# Patient Record
Sex: Male | Born: 1945 | Race: White | Hispanic: No | Marital: Married | Smoking: Former smoker
Health system: Southern US, Community
[De-identification: ages and names within clinical notes are randomized; demographics above are authoritative.]

## PROBLEM LIST (undated history)

## (undated) DIAGNOSIS — F419 Anxiety disorder, unspecified: Secondary | ICD-10-CM

## (undated) DIAGNOSIS — T4145XA Adverse effect of unspecified anesthetic, initial encounter: Secondary | ICD-10-CM

## (undated) DIAGNOSIS — Z8719 Personal history of other diseases of the digestive system: Secondary | ICD-10-CM

## (undated) DIAGNOSIS — I10 Essential (primary) hypertension: Secondary | ICD-10-CM

## (undated) DIAGNOSIS — T8859XA Other complications of anesthesia, initial encounter: Secondary | ICD-10-CM

## (undated) DIAGNOSIS — M1990 Unspecified osteoarthritis, unspecified site: Secondary | ICD-10-CM

## (undated) DIAGNOSIS — G709 Myoneural disorder, unspecified: Secondary | ICD-10-CM

## (undated) DIAGNOSIS — F329 Major depressive disorder, single episode, unspecified: Secondary | ICD-10-CM

## (undated) DIAGNOSIS — J439 Emphysema, unspecified: Secondary | ICD-10-CM

## (undated) DIAGNOSIS — F4310 Post-traumatic stress disorder, unspecified: Secondary | ICD-10-CM

## (undated) DIAGNOSIS — Z8709 Personal history of other diseases of the respiratory system: Secondary | ICD-10-CM

## (undated) DIAGNOSIS — C801 Malignant (primary) neoplasm, unspecified: Secondary | ICD-10-CM

## (undated) DIAGNOSIS — R51 Headache: Secondary | ICD-10-CM

## (undated) DIAGNOSIS — F32A Depression, unspecified: Secondary | ICD-10-CM

## (undated) HISTORY — PX: JOINT REPLACEMENT: SHX530

## (undated) HISTORY — DX: Anxiety disorder, unspecified: F41.9

## (undated) HISTORY — PX: COLONOSCOPY W/ POLYPECTOMY: SHX1380

## (undated) HISTORY — DX: Major depressive disorder, single episode, unspecified: F32.9

## (undated) HISTORY — PX: BRAIN SURGERY: SHX531

## (undated) HISTORY — DX: Personal history of other diseases of the respiratory system: Z87.09

## (undated) HISTORY — PX: TOTAL HIP ARTHROPLASTY: SHX124

## (undated) HISTORY — DX: Emphysema, unspecified: J43.9

## (undated) HISTORY — DX: Depression, unspecified: F32.A

## (undated) HISTORY — PX: HERNIA REPAIR: SHX51

## (undated) HISTORY — PX: HIP SURGERY: SHX245

## (undated) HISTORY — PX: NECK SURGERY: SHX720

## (undated) HISTORY — PX: BACK SURGERY: SHX140

---

## 1999-05-15 ENCOUNTER — Emergency Department (HOSPITAL_COMMUNITY): Admission: EM | Admit: 1999-05-15 | Discharge: 1999-05-15 | Payer: Self-pay | Admitting: Emergency Medicine

## 1999-05-15 ENCOUNTER — Encounter: Payer: Self-pay | Admitting: Emergency Medicine

## 2000-04-03 ENCOUNTER — Encounter: Admission: RE | Admit: 2000-04-03 | Discharge: 2000-04-03 | Payer: Self-pay | Admitting: Internal Medicine

## 2000-08-14 ENCOUNTER — Emergency Department (HOSPITAL_COMMUNITY): Admission: EM | Admit: 2000-08-14 | Discharge: 2000-08-14 | Payer: Self-pay | Admitting: Emergency Medicine

## 2000-08-15 ENCOUNTER — Encounter: Payer: Self-pay | Admitting: Emergency Medicine

## 2000-08-22 ENCOUNTER — Encounter: Admission: RE | Admit: 2000-08-22 | Discharge: 2000-08-22 | Payer: Self-pay | Admitting: Internal Medicine

## 2000-10-19 ENCOUNTER — Ambulatory Visit (HOSPITAL_COMMUNITY): Admission: RE | Admit: 2000-10-19 | Discharge: 2000-10-19 | Payer: Self-pay | Admitting: Neurosurgery

## 2000-10-19 ENCOUNTER — Encounter: Payer: Self-pay | Admitting: Neurosurgery

## 2002-06-16 ENCOUNTER — Emergency Department (HOSPITAL_COMMUNITY): Admission: EM | Admit: 2002-06-16 | Discharge: 2002-06-16 | Payer: Self-pay | Admitting: Internal Medicine

## 2006-06-26 ENCOUNTER — Inpatient Hospital Stay (HOSPITAL_COMMUNITY): Admission: EM | Admit: 2006-06-26 | Discharge: 2006-07-11 | Payer: Self-pay | Admitting: Emergency Medicine

## 2006-06-28 ENCOUNTER — Encounter (INDEPENDENT_AMBULATORY_CARE_PROVIDER_SITE_OTHER): Payer: Self-pay | Admitting: Specialist

## 2006-06-29 ENCOUNTER — Encounter (INDEPENDENT_AMBULATORY_CARE_PROVIDER_SITE_OTHER): Payer: Self-pay | Admitting: Interventional Radiology

## 2006-06-30 ENCOUNTER — Encounter (INDEPENDENT_AMBULATORY_CARE_PROVIDER_SITE_OTHER): Payer: Self-pay | Admitting: *Deleted

## 2006-07-04 ENCOUNTER — Encounter (INDEPENDENT_AMBULATORY_CARE_PROVIDER_SITE_OTHER): Payer: Self-pay | Admitting: Neurosurgery

## 2006-07-04 ENCOUNTER — Ambulatory Visit: Payer: Self-pay | Admitting: Oncology

## 2006-07-04 ENCOUNTER — Encounter (INDEPENDENT_AMBULATORY_CARE_PROVIDER_SITE_OTHER): Payer: Self-pay | Admitting: Specialist

## 2006-07-05 ENCOUNTER — Ambulatory Visit: Payer: Self-pay | Admitting: Oncology

## 2006-07-07 ENCOUNTER — Ambulatory Visit: Payer: Self-pay | Admitting: Cardiology

## 2006-07-07 ENCOUNTER — Encounter: Payer: Self-pay | Admitting: Cardiology

## 2006-07-20 ENCOUNTER — Ambulatory Visit (HOSPITAL_COMMUNITY): Admission: RE | Admit: 2006-07-20 | Discharge: 2006-07-20 | Payer: Self-pay | Admitting: Oncology

## 2006-07-26 ENCOUNTER — Ambulatory Visit: Admission: RE | Admit: 2006-07-26 | Discharge: 2006-09-01 | Payer: Self-pay | Admitting: *Deleted

## 2006-08-16 ENCOUNTER — Ambulatory Visit: Payer: Self-pay | Admitting: Oncology

## 2006-08-16 LAB — CBC WITH DIFFERENTIAL/PLATELET
BASO%: 0.4 % (ref 0.0–2.0)
LYMPH%: 18.5 % (ref 14.0–48.0)
MCHC: 34.4 g/dL (ref 32.0–35.9)
MONO#: 0.5 10*3/uL (ref 0.1–0.9)
Platelets: 186 10*3/uL (ref 145–400)
RBC: 4.82 10*6/uL (ref 4.20–5.71)
RDW: 15.2 % — ABNORMAL HIGH (ref 11.2–14.6)
WBC: 3.3 10*3/uL — ABNORMAL LOW (ref 4.0–10.0)
lymph#: 0.6 10*3/uL — ABNORMAL LOW (ref 0.9–3.3)

## 2006-08-25 LAB — CBC WITH DIFFERENTIAL/PLATELET
BASO%: 0.4 % (ref 0.0–2.0)
HCT: 43.3 % (ref 38.7–49.9)
MCHC: 33.3 g/dL (ref 32.0–35.9)
MONO#: 0.7 10*3/uL (ref 0.1–0.9)
NEUT%: 81.4 % — ABNORMAL HIGH (ref 40.0–75.0)
WBC: 8.5 10*3/uL (ref 4.0–10.0)
lymph#: 0.7 10*3/uL — ABNORMAL LOW (ref 0.9–3.3)

## 2006-09-06 LAB — URINALYSIS, MICROSCOPIC - CHCC
Ketones: NEGATIVE mg/dL
Protein: <30 mg/dL
Specific Gravity, Urine: 1.025 (ref 1.003–1.035)
pH: 6 (ref 4.6–8.0)

## 2006-09-06 LAB — CBC WITH DIFFERENTIAL/PLATELET
Basophils Absolute: 0.1 10*3/uL (ref 0.0–0.1)
HCT: 41.9 % (ref 38.7–49.9)
HGB: 14.4 g/dL (ref 13.0–17.1)
MCH: 31.3 pg (ref 28.0–33.4)
MONO#: 0.8 10*3/uL (ref 0.1–0.9)
NEUT%: 70.2 % (ref 40.0–75.0)
WBC: 6.3 10*3/uL (ref 4.0–10.0)
lymph#: 0.9 10*3/uL (ref 0.9–3.3)

## 2006-09-15 LAB — CBC WITH DIFFERENTIAL/PLATELET
BASO%: 0.5 % (ref 0.0–2.0)
Basophils Absolute: 0 10*3/uL (ref 0.0–0.1)
EOS%: 2.2 % (ref 0.0–7.0)
Eosinophils Absolute: 0.1 10*3/uL (ref 0.0–0.5)
HCT: 38.4 % — ABNORMAL LOW (ref 38.7–49.9)
HGB: 13.9 g/dL (ref 13.0–17.1)
LYMPH%: 24.7 % (ref 14.0–48.0)
MCH: 31.7 pg (ref 28.0–33.4)
MCHC: 36.3 g/dL — ABNORMAL HIGH (ref 32.0–35.9)
MCV: 87.4 fL (ref 81.6–98.0)
MONO#: 0.3 10*3/uL (ref 0.1–0.9)
MONO%: 10.6 % (ref 0.0–13.0)
NEUT#: 1.6 10*3/uL (ref 1.5–6.5)
NEUT%: 62 % (ref 40.0–75.0)
Platelets: 163 10*3/uL (ref 145–400)
RBC: 4.39 10*6/uL (ref 4.20–5.71)
RDW: 17.7 % — ABNORMAL HIGH (ref 11.2–14.6)
WBC: 2.6 10*3/uL — ABNORMAL LOW (ref 4.0–10.0)
lymph#: 0.6 10*3/uL — ABNORMAL LOW (ref 0.9–3.3)

## 2006-09-26 LAB — CBC WITH DIFFERENTIAL/PLATELET
Basophils Absolute: 0 10*3/uL (ref 0.0–0.1)
Eosinophils Absolute: 0 10*3/uL (ref 0.0–0.5)
HCT: 35.5 % — ABNORMAL LOW (ref 38.7–49.9)
HGB: 12.8 g/dL — ABNORMAL LOW (ref 13.0–17.1)
LYMPH%: 9.4 % — ABNORMAL LOW (ref 14.0–48.0)
MCHC: 36 g/dL — ABNORMAL HIGH (ref 32.0–35.9)
MONO#: 0.8 10*3/uL (ref 0.1–0.9)
NEUT#: 4.4 10*3/uL (ref 1.5–6.5)
NEUT%: 75.3 % — ABNORMAL HIGH (ref 40.0–75.0)
Platelets: 351 10*3/uL (ref 145–400)
WBC: 5.8 10*3/uL (ref 4.0–10.0)

## 2006-09-26 LAB — COMPREHENSIVE METABOLIC PANEL
BUN: 18 mg/dL (ref 6–23)
CO2: 23 mEq/L (ref 19–32)
Calcium: 8.9 mg/dL (ref 8.4–10.5)
Creatinine, Ser: 0.95 mg/dL (ref 0.40–1.50)
Glucose, Bld: 133 mg/dL — ABNORMAL HIGH (ref 70–99)
Total Bilirubin: 0.3 mg/dL (ref 0.3–1.2)

## 2006-09-28 ENCOUNTER — Ambulatory Visit: Payer: Self-pay | Admitting: Oncology

## 2006-10-10 LAB — COMPREHENSIVE METABOLIC PANEL
Albumin: 4.1 g/dL (ref 3.5–5.2)
BUN: 23 mg/dL (ref 6–23)
CO2: 25 mEq/L (ref 19–32)
Calcium: 9.2 mg/dL (ref 8.4–10.5)
Chloride: 107 mEq/L (ref 96–112)
Glucose, Bld: 74 mg/dL (ref 70–99)
Potassium: 4.6 mEq/L (ref 3.5–5.3)
Total Protein: 6.5 g/dL (ref 6.0–8.3)

## 2006-10-10 LAB — PSA: PSA: 1.54 ng/mL (ref 0.10–4.00)

## 2006-10-10 LAB — CBC WITH DIFFERENTIAL/PLATELET
Eosinophils Absolute: 0.1 10*3/uL (ref 0.0–0.5)
HCT: 38.8 % (ref 38.7–49.9)
LYMPH%: 14.9 % (ref 14.0–48.0)
MONO#: 0.7 10*3/uL (ref 0.1–0.9)
NEUT#: 3.9 10*3/uL (ref 1.5–6.5)
NEUT%: 68.6 % (ref 40.0–75.0)
Platelets: 175 10*3/uL (ref 145–400)
WBC: 5.7 10*3/uL (ref 4.0–10.0)
lymph#: 0.8 10*3/uL — ABNORMAL LOW (ref 0.9–3.3)

## 2006-10-17 LAB — COMPREHENSIVE METABOLIC PANEL
ALT: 36 U/L (ref 0–53)
Albumin: 4 g/dL (ref 3.5–5.2)
Alkaline Phosphatase: 82 U/L (ref 39–117)
CO2: 22 mEq/L (ref 19–32)
Glucose, Bld: 90 mg/dL (ref 70–99)
Potassium: 4.3 mEq/L (ref 3.5–5.3)
Sodium: 140 mEq/L (ref 135–145)
Total Bilirubin: 0.4 mg/dL (ref 0.3–1.2)
Total Protein: 6.7 g/dL (ref 6.0–8.3)

## 2006-10-17 LAB — CBC WITH DIFFERENTIAL/PLATELET
BASO%: 0.1 % (ref 0.0–2.0)
Eosinophils Absolute: 0 10*3/uL (ref 0.0–0.5)
MCHC: 35 g/dL (ref 32.0–35.9)
MONO#: 1.2 10*3/uL — ABNORMAL HIGH (ref 0.1–0.9)
NEUT#: 6.3 10*3/uL (ref 1.5–6.5)
RBC: 3.97 10*6/uL — ABNORMAL LOW (ref 4.20–5.71)
RDW: 19.9 % — ABNORMAL HIGH (ref 11.2–14.6)
WBC: 8.5 10*3/uL (ref 4.0–10.0)

## 2006-10-27 LAB — CBC WITH DIFFERENTIAL/PLATELET
BASO%: 0.2 % (ref 0.0–2.0)
EOS%: 1.4 % (ref 0.0–7.0)
Eosinophils Absolute: 0 10*3/uL (ref 0.0–0.5)
LYMPH%: 24.7 % (ref 14.0–48.0)
MCHC: 35.8 g/dL (ref 32.0–35.9)
MCV: 91.3 fL (ref 81.6–98.0)
MONO%: 12.9 % (ref 0.0–13.0)
NEUT#: 1.9 10*3/uL (ref 1.5–6.5)
Platelets: 172 10*3/uL (ref 145–400)
RBC: 4.09 10*6/uL — ABNORMAL LOW (ref 4.20–5.71)
RDW: 17.1 % — ABNORMAL HIGH (ref 11.2–14.6)

## 2006-10-31 ENCOUNTER — Ambulatory Visit (HOSPITAL_COMMUNITY): Admission: RE | Admit: 2006-10-31 | Discharge: 2006-10-31 | Payer: Self-pay | Admitting: Oncology

## 2006-11-06 LAB — COMPREHENSIVE METABOLIC PANEL
ALT: 24 U/L (ref 0–53)
AST: 25 U/L (ref 0–37)
BUN: 20 mg/dL (ref 6–23)
Calcium: 9.1 mg/dL (ref 8.4–10.5)
Chloride: 104 mEq/L (ref 96–112)
Creatinine, Ser: 1.14 mg/dL (ref 0.40–1.50)
Total Bilirubin: 0.4 mg/dL (ref 0.3–1.2)

## 2006-11-06 LAB — CBC WITH DIFFERENTIAL/PLATELET
BASO%: 0.4 % (ref 0.0–2.0)
Eosinophils Absolute: 0 10*3/uL (ref 0.0–0.5)
LYMPH%: 13.3 % — ABNORMAL LOW (ref 14.0–48.0)
MCHC: 35.8 g/dL (ref 32.0–35.9)
MONO#: 0.8 10*3/uL (ref 0.1–0.9)
NEUT#: 4 10*3/uL (ref 1.5–6.5)
RBC: 3.96 10*6/uL — ABNORMAL LOW (ref 4.20–5.71)
RDW: 18.3 % — ABNORMAL HIGH (ref 11.2–14.6)
WBC: 5.6 10*3/uL (ref 4.0–10.0)
lymph#: 0.7 10*3/uL — ABNORMAL LOW (ref 0.9–3.3)

## 2006-11-10 ENCOUNTER — Ambulatory Visit: Payer: Self-pay | Admitting: Oncology

## 2006-11-16 LAB — CBC WITH DIFFERENTIAL/PLATELET
Basophils Absolute: 0 10*3/uL (ref 0.0–0.1)
HCT: 35.1 % — ABNORMAL LOW (ref 38.7–49.9)
HGB: 12.6 g/dL — ABNORMAL LOW (ref 13.0–17.1)
LYMPH%: 33.1 % (ref 14.0–48.0)
MCH: 34 pg — ABNORMAL HIGH (ref 28.0–33.4)
MONO#: 0.2 10*3/uL (ref 0.1–0.9)
NEUT%: 49.1 % (ref 40.0–75.0)
Platelets: 128 10*3/uL — ABNORMAL LOW (ref 145–400)
WBC: 1.4 10*3/uL — ABNORMAL LOW (ref 4.0–10.0)
lymph#: 0.5 10*3/uL — ABNORMAL LOW (ref 0.9–3.3)

## 2006-11-29 LAB — CBC WITH DIFFERENTIAL/PLATELET
Basophils Absolute: 0 10*3/uL (ref 0.0–0.1)
EOS%: 0.3 % (ref 0.0–7.0)
HCT: 37.2 % — ABNORMAL LOW (ref 38.7–49.9)
HGB: 13 g/dL (ref 13.0–17.1)
LYMPH%: 14.9 % (ref 14.0–48.0)
MCH: 34 pg — ABNORMAL HIGH (ref 28.0–33.4)
MCHC: 35.1 g/dL (ref 32.0–35.9)
MCV: 96.9 fL (ref 81.6–98.0)
MONO%: 18 % — ABNORMAL HIGH (ref 0.0–13.0)
NEUT%: 66.5 % (ref 40.0–75.0)

## 2006-12-06 LAB — COMPREHENSIVE METABOLIC PANEL
AST: 17 U/L (ref 0–37)
Albumin: 4.1 g/dL (ref 3.5–5.2)
Alkaline Phosphatase: 101 U/L (ref 39–117)
BUN: 20 mg/dL (ref 6–23)
Potassium: 4.2 mEq/L (ref 3.5–5.3)

## 2006-12-06 LAB — CBC WITH DIFFERENTIAL/PLATELET
BASO%: 0.6 % (ref 0.0–2.0)
Basophils Absolute: 0 10*3/uL (ref 0.0–0.1)
EOS%: 2.7 % (ref 0.0–7.0)
MCH: 34.9 pg — ABNORMAL HIGH (ref 28.0–33.4)
MCHC: 35.9 g/dL (ref 32.0–35.9)
MCV: 97.2 fL (ref 81.6–98.0)
MONO%: 8.3 % (ref 0.0–13.0)
RBC: 3.63 10*6/uL — ABNORMAL LOW (ref 4.20–5.71)
RDW: 14.7 % — ABNORMAL HIGH (ref 11.2–14.6)

## 2006-12-13 LAB — CBC WITH DIFFERENTIAL/PLATELET
Basophils Absolute: 0.2 10*3/uL — ABNORMAL HIGH (ref 0.0–0.1)
Eosinophils Absolute: 0 10*3/uL (ref 0.0–0.5)
HCT: 36.6 % — ABNORMAL LOW (ref 38.7–49.9)
HGB: 13.2 g/dL (ref 13.0–17.1)
LYMPH%: 3.5 % — ABNORMAL LOW (ref 14.0–48.0)
MCV: 95.5 fL (ref 81.6–98.0)
MONO%: 6.3 % (ref 0.0–13.0)
NEUT#: 14.4 10*3/uL — ABNORMAL HIGH (ref 1.5–6.5)
NEUT%: 88.9 % — ABNORMAL HIGH (ref 40.0–75.0)
Platelets: 171 10*3/uL (ref 145–400)

## 2007-02-26 ENCOUNTER — Ambulatory Visit: Payer: Self-pay | Admitting: Oncology

## 2007-03-02 LAB — CBC WITH DIFFERENTIAL/PLATELET
Eosinophils Absolute: 0.1 10*3/uL (ref 0.0–0.5)
LYMPH%: 34.5 % (ref 14.0–48.0)
MCH: 33.5 pg — ABNORMAL HIGH (ref 28.0–33.4)
MCHC: 35.6 g/dL (ref 32.0–35.9)
MCV: 94 fL (ref 81.6–98.0)
MONO%: 20.2 % — ABNORMAL HIGH (ref 0.0–13.0)
NEUT#: 1.4 10*3/uL — ABNORMAL LOW (ref 1.5–6.5)
Platelets: 169 10*3/uL (ref 145–400)
RBC: 4.37 10*6/uL (ref 4.20–5.71)

## 2007-03-06 LAB — IMMUNOFIXATION ELECTROPHORESIS: Total Protein, Serum Electrophoresis: 7 g/dL (ref 6.0–8.3)

## 2007-03-06 LAB — COMPREHENSIVE METABOLIC PANEL
Alkaline Phosphatase: 78 U/L (ref 39–117)
Glucose, Bld: 84 mg/dL (ref 70–99)
Sodium: 140 mEq/L (ref 135–145)
Total Bilirubin: 0.4 mg/dL (ref 0.3–1.2)
Total Protein: 7 g/dL (ref 6.0–8.3)

## 2007-03-06 LAB — LACTATE DEHYDROGENASE: LDH: 192 U/L (ref 94–250)

## 2007-05-25 ENCOUNTER — Ambulatory Visit: Payer: Self-pay | Admitting: Oncology

## 2007-05-31 ENCOUNTER — Ambulatory Visit (HOSPITAL_COMMUNITY): Admission: RE | Admit: 2007-05-31 | Discharge: 2007-05-31 | Payer: Self-pay | Admitting: Oncology

## 2007-05-31 LAB — COMPREHENSIVE METABOLIC PANEL
AST: 32 U/L (ref 0–37)
BUN: 16 mg/dL (ref 6–23)
Calcium: 9.5 mg/dL (ref 8.4–10.5)
Chloride: 104 mEq/L (ref 96–112)
Creatinine, Ser: 0.87 mg/dL (ref 0.40–1.50)

## 2007-05-31 LAB — CBC WITH DIFFERENTIAL/PLATELET
Basophils Absolute: 0 10*3/uL (ref 0.0–0.1)
EOS%: 3.6 % (ref 0.0–7.0)
HCT: 43 % (ref 38.7–49.9)
HGB: 15.1 g/dL (ref 13.0–17.1)
LYMPH%: 24.7 % (ref 14.0–48.0)
MCH: 33.4 pg (ref 28.0–33.4)
MCV: 95 fL (ref 81.6–98.0)
NEUT%: 55.5 % (ref 40.0–75.0)
Platelets: 175 10*3/uL (ref 145–400)
lymph#: 0.9 10*3/uL (ref 0.9–3.3)

## 2007-05-31 LAB — LACTATE DEHYDROGENASE: LDH: 162 U/L (ref 94–250)

## 2007-07-04 LAB — CBC WITH DIFFERENTIAL/PLATELET
BASO%: 0.9 % (ref 0.0–2.0)
EOS%: 4.7 % (ref 0.0–7.0)
LYMPH%: 20.7 % (ref 14.0–48.0)
MCH: 34.2 pg — ABNORMAL HIGH (ref 28.0–33.4)
MCHC: 36.7 g/dL — ABNORMAL HIGH (ref 32.0–35.9)
MONO#: 0.7 10*3/uL (ref 0.1–0.9)
NEUT%: 58.5 % (ref 40.0–75.0)
RBC: 4.37 10*6/uL (ref 4.20–5.71)
WBC: 4.5 10*3/uL (ref 4.0–10.0)
lymph#: 0.9 10*3/uL (ref 0.9–3.3)

## 2007-11-09 ENCOUNTER — Ambulatory Visit: Payer: Self-pay | Admitting: Oncology

## 2007-12-20 LAB — CBC WITH DIFFERENTIAL/PLATELET
BASO%: 0.5 % (ref 0.0–2.0)
Eosinophils Absolute: 0.1 10*3/uL (ref 0.0–0.5)
LYMPH%: 10.8 % — ABNORMAL LOW (ref 14.0–48.0)
MONO#: 0.9 10*3/uL (ref 0.1–0.9)
NEUT#: 9.2 10*3/uL — ABNORMAL HIGH (ref 1.5–6.5)
Platelets: 178 10*3/uL (ref 145–400)
RBC: 4.18 10*6/uL — ABNORMAL LOW (ref 4.20–5.71)
WBC: 11.4 10*3/uL — ABNORMAL HIGH (ref 4.0–10.0)
lymph#: 1.2 10*3/uL (ref 0.9–3.3)

## 2007-12-20 LAB — CHCC SMEAR

## 2007-12-20 LAB — MORPHOLOGY: PLT EST: ADEQUATE

## 2008-02-27 ENCOUNTER — Ambulatory Visit: Payer: Self-pay | Admitting: Oncology

## 2008-03-03 LAB — CBC WITH DIFFERENTIAL/PLATELET
Basophils Absolute: 0 10*3/uL (ref 0.0–0.1)
Eosinophils Absolute: 0.3 10*3/uL (ref 0.0–0.5)
HCT: 42.4 % (ref 38.7–49.9)
HGB: 14.9 g/dL (ref 13.0–17.1)
LYMPH%: 20.5 % (ref 14.0–48.0)
MCV: 95.6 fL (ref 81.6–98.0)
MONO#: 0.5 10*3/uL (ref 0.1–0.9)
MONO%: 10.1 % (ref 0.0–13.0)
NEUT#: 3.1 10*3/uL (ref 1.5–6.5)
Platelets: 163 10*3/uL (ref 145–400)
WBC: 4.9 10*3/uL (ref 4.0–10.0)

## 2008-03-03 LAB — CHCC SMEAR

## 2008-03-04 LAB — COMPREHENSIVE METABOLIC PANEL
ALT: 25 U/L (ref 0–53)
Alkaline Phosphatase: 77 U/L (ref 39–117)
Creatinine, Ser: 1.28 mg/dL (ref 0.40–1.50)
Glucose, Bld: 81 mg/dL (ref 70–99)
Sodium: 140 mEq/L (ref 135–145)
Total Bilirubin: 0.4 mg/dL (ref 0.3–1.2)
Total Protein: 7.1 g/dL (ref 6.0–8.3)

## 2008-03-04 LAB — IGG, IGA, IGM: IgA: 244 mg/dL (ref 68–378)

## 2008-03-04 LAB — BETA 2 MICROGLOBULIN, SERUM: Beta-2 Microglobulin: 1.71 mg/L (ref 1.01–1.73)

## 2008-05-23 ENCOUNTER — Ambulatory Visit: Payer: Self-pay | Admitting: Oncology

## 2008-05-27 LAB — CBC WITH DIFFERENTIAL/PLATELET
BASO%: 0.4 % (ref 0.0–2.0)
EOS%: 3.4 % (ref 0.0–7.0)
HCT: 45.4 % (ref 38.7–49.9)
LYMPH%: 26.7 % (ref 14.0–48.0)
MCH: 34.6 pg — ABNORMAL HIGH (ref 28.0–33.4)
MCHC: 35 g/dL (ref 32.0–35.9)
MCV: 98.8 fL — ABNORMAL HIGH (ref 81.6–98.0)
MONO%: 13.3 % — ABNORMAL HIGH (ref 0.0–13.0)
NEUT%: 56.2 % (ref 40.0–75.0)
lymph#: 1.3 10*3/uL (ref 0.9–3.3)

## 2008-05-28 LAB — COMPREHENSIVE METABOLIC PANEL
ALT: 21 U/L (ref 0–53)
AST: 23 U/L (ref 0–37)
Alkaline Phosphatase: 70 U/L (ref 39–117)
BUN: 27 mg/dL — ABNORMAL HIGH (ref 6–23)
Chloride: 105 mEq/L (ref 96–112)
Creatinine, Ser: 1.09 mg/dL (ref 0.40–1.50)
Total Bilirubin: 0.7 mg/dL (ref 0.3–1.2)

## 2008-08-19 ENCOUNTER — Ambulatory Visit: Payer: Self-pay | Admitting: Oncology

## 2008-08-25 ENCOUNTER — Inpatient Hospital Stay (HOSPITAL_COMMUNITY): Admission: RE | Admit: 2008-08-25 | Discharge: 2008-08-27 | Payer: Self-pay | Admitting: Orthopedic Surgery

## 2008-09-15 ENCOUNTER — Encounter (HOSPITAL_COMMUNITY): Admission: RE | Admit: 2008-09-15 | Discharge: 2008-10-15 | Payer: Self-pay | Admitting: Orthopedic Surgery

## 2008-09-17 LAB — CBC WITH DIFFERENTIAL/PLATELET
BASO%: 0.4 % (ref 0.0–2.0)
Eosinophils Absolute: 0.1 10*3/uL (ref 0.0–0.5)
MCHC: 34.4 g/dL (ref 32.0–35.9)
MONO#: 0.6 10*3/uL (ref 0.1–0.9)
NEUT#: 3.7 10*3/uL (ref 1.5–6.5)
RBC: 4.43 10*6/uL (ref 4.20–5.71)
RDW: 13.6 % (ref 11.2–14.6)
WBC: 5.7 10*3/uL (ref 4.0–10.0)
lymph#: 1.2 10*3/uL (ref 0.9–3.3)

## 2008-09-18 LAB — COMPREHENSIVE METABOLIC PANEL
ALT: 22 U/L (ref 0–53)
Albumin: 4.2 g/dL (ref 3.5–5.2)
Alkaline Phosphatase: 130 U/L — ABNORMAL HIGH (ref 39–117)
CO2: 24 mEq/L (ref 19–32)
Glucose, Bld: 108 mg/dL — ABNORMAL HIGH (ref 70–99)
Potassium: 4.3 mEq/L (ref 3.5–5.3)
Sodium: 143 mEq/L (ref 135–145)
Total Bilirubin: 0.4 mg/dL (ref 0.3–1.2)
Total Protein: 7.1 g/dL (ref 6.0–8.3)

## 2008-09-18 LAB — LACTATE DEHYDROGENASE: LDH: 163 U/L (ref 94–250)

## 2008-09-18 LAB — BETA 2 MICROGLOBULIN, SERUM: Beta-2 Microglobulin: 2.25 mg/L — ABNORMAL HIGH (ref 1.01–1.73)

## 2008-09-18 LAB — IGG, IGA, IGM: IgM, Serum: 101 mg/dL (ref 60–263)

## 2008-10-27 ENCOUNTER — Encounter (HOSPITAL_COMMUNITY): Admission: RE | Admit: 2008-10-27 | Discharge: 2008-11-05 | Payer: Self-pay | Admitting: Orthopedic Surgery

## 2008-12-15 ENCOUNTER — Ambulatory Visit: Payer: Self-pay | Admitting: Oncology

## 2008-12-17 LAB — CBC WITH DIFFERENTIAL/PLATELET
Basophils Absolute: 0 10*3/uL (ref 0.0–0.1)
Eosinophils Absolute: 0.2 10*3/uL (ref 0.0–0.5)
HGB: 15.6 g/dL (ref 13.0–17.1)
MCV: 96.1 fL (ref 79.3–98.0)
MONO#: 0.5 10*3/uL (ref 0.1–0.9)
NEUT#: 3.4 10*3/uL (ref 1.5–6.5)
RBC: 4.69 10*6/uL (ref 4.20–5.82)
RDW: 13.6 % (ref 11.0–14.6)
WBC: 5.2 10*3/uL (ref 4.0–10.3)
lymph#: 1.1 10*3/uL (ref 0.9–3.3)

## 2008-12-18 LAB — COMPREHENSIVE METABOLIC PANEL
Albumin: 4.3 g/dL (ref 3.5–5.2)
BUN: 28 mg/dL — ABNORMAL HIGH (ref 6–23)
CO2: 21 mEq/L (ref 19–32)
Calcium: 9.1 mg/dL (ref 8.4–10.5)
Chloride: 106 mEq/L (ref 96–112)
Glucose, Bld: 103 mg/dL — ABNORMAL HIGH (ref 70–99)
Potassium: 4.3 mEq/L (ref 3.5–5.3)
Sodium: 140 mEq/L (ref 135–145)
Total Protein: 6.7 g/dL (ref 6.0–8.3)

## 2008-12-18 LAB — IGG, IGA, IGM
IgA: 280 mg/dL (ref 68–378)
IgG (Immunoglobin G), Serum: 860 mg/dL (ref 694–1618)

## 2009-03-03 ENCOUNTER — Ambulatory Visit: Payer: Self-pay | Admitting: Oncology

## 2009-03-05 LAB — CHCC SMEAR

## 2009-03-05 LAB — MORPHOLOGY: PLT EST: DECREASED

## 2009-03-05 LAB — CBC WITH DIFFERENTIAL/PLATELET
Basophils Absolute: 0 10*3/uL (ref 0.0–0.1)
EOS%: 4.9 % (ref 0.0–7.0)
HCT: 43.4 % (ref 38.4–49.9)
HGB: 15.1 g/dL (ref 13.0–17.1)
MCH: 33.2 pg (ref 27.2–33.4)
MCV: 95.4 fL (ref 79.3–98.0)
MONO%: 12.1 % (ref 0.0–14.0)
NEUT%: 51.6 % (ref 39.0–75.0)
Platelets: 136 10*3/uL — ABNORMAL LOW (ref 140–400)

## 2009-03-06 LAB — COMPREHENSIVE METABOLIC PANEL
ALT: 17 U/L (ref 0–53)
AST: 22 U/L (ref 0–37)
Alkaline Phosphatase: 96 U/L (ref 39–117)
Calcium: 8.7 mg/dL (ref 8.4–10.5)
Chloride: 105 mEq/L (ref 96–112)
Creatinine, Ser: 1.01 mg/dL (ref 0.40–1.50)

## 2009-03-06 LAB — IGG, IGA, IGM
IgA: 265 mg/dL (ref 68–378)
IgM, Serum: 94 mg/dL (ref 60–263)

## 2009-06-05 ENCOUNTER — Ambulatory Visit: Payer: Self-pay | Admitting: Oncology

## 2009-06-16 LAB — COMPREHENSIVE METABOLIC PANEL
AST: 33 U/L (ref 0–37)
Albumin: 4 g/dL (ref 3.5–5.2)
Alkaline Phosphatase: 93 U/L (ref 39–117)
Potassium: 4.5 mEq/L (ref 3.5–5.3)
Sodium: 137 mEq/L (ref 135–145)
Total Protein: 6.5 g/dL (ref 6.0–8.3)

## 2009-06-16 LAB — CBC WITH DIFFERENTIAL/PLATELET
EOS%: 2.5 % (ref 0.0–7.0)
Eosinophils Absolute: 0.1 10*3/uL (ref 0.0–0.5)
MCH: 34 pg — ABNORMAL HIGH (ref 27.2–33.4)
MCV: 99.3 fL — ABNORMAL HIGH (ref 79.3–98.0)
MONO%: 11.6 % (ref 0.0–14.0)
NEUT#: 3.4 10*3/uL (ref 1.5–6.5)
RBC: 4.62 10*6/uL (ref 4.20–5.82)
RDW: 13.3 % (ref 11.0–14.6)
lymph#: 1.1 10*3/uL (ref 0.9–3.3)

## 2009-08-17 ENCOUNTER — Ambulatory Visit: Payer: Self-pay | Admitting: Oncology

## 2009-08-20 LAB — CBC WITH DIFFERENTIAL/PLATELET
BASO%: 0.4 % (ref 0.0–2.0)
HCT: 48.2 % (ref 38.4–49.9)
LYMPH%: 14.3 % (ref 14.0–49.0)
MCHC: 34.8 g/dL (ref 32.0–36.0)
MCV: 100 fL — ABNORMAL HIGH (ref 79.3–98.0)
MONO%: 9.4 % (ref 0.0–14.0)
NEUT%: 74.2 % (ref 39.0–75.0)
Platelets: 151 10*3/uL (ref 140–400)
RBC: 4.82 10*6/uL (ref 4.20–5.82)

## 2009-08-21 LAB — COMPREHENSIVE METABOLIC PANEL
ALT: 21 U/L (ref 0–53)
Alkaline Phosphatase: 70 U/L (ref 39–117)
CO2: 24 mEq/L (ref 19–32)
Creatinine, Ser: 1.37 mg/dL (ref 0.40–1.50)
Glucose, Bld: 89 mg/dL (ref 70–99)
Sodium: 139 mEq/L (ref 135–145)
Total Bilirubin: 0.4 mg/dL (ref 0.3–1.2)
Total Protein: 6.8 g/dL (ref 6.0–8.3)

## 2009-08-21 LAB — URIC ACID: Uric Acid, Serum: 7.2 mg/dL (ref 4.0–7.8)

## 2009-08-21 LAB — LACTATE DEHYDROGENASE: LDH: 165 U/L (ref 94–250)

## 2009-11-20 ENCOUNTER — Ambulatory Visit: Payer: Self-pay | Admitting: Oncology

## 2009-11-24 LAB — CBC WITH DIFFERENTIAL/PLATELET
BASO%: 0.6 % (ref 0.0–2.0)
EOS%: 2.3 % (ref 0.0–7.0)
HCT: 44.9 % (ref 38.4–49.9)
LYMPH%: 21.1 % (ref 14.0–49.0)
MCH: 34.5 pg — ABNORMAL HIGH (ref 27.2–33.4)
MCHC: 35.2 g/dL (ref 32.0–36.0)
MONO#: 0.6 10*3/uL (ref 0.1–0.9)
NEUT%: 64.5 % (ref 39.0–75.0)
Platelets: 159 10*3/uL (ref 140–400)
RBC: 4.58 10*6/uL (ref 4.20–5.82)
WBC: 5.1 10*3/uL (ref 4.0–10.3)

## 2009-11-25 LAB — LACTATE DEHYDROGENASE: LDH: 175 U/L (ref 94–250)

## 2009-11-25 LAB — COMPREHENSIVE METABOLIC PANEL
ALT: 23 U/L (ref 0–53)
AST: 28 U/L (ref 0–37)
Albumin: 4.6 g/dL (ref 3.5–5.2)
Alkaline Phosphatase: 85 U/L (ref 39–117)
BUN: 29 mg/dL — ABNORMAL HIGH (ref 6–23)
CO2: 25 mEq/L (ref 19–32)
Calcium: 9.6 mg/dL (ref 8.4–10.5)
Chloride: 105 mEq/L (ref 96–112)
Creatinine, Ser: 1.55 mg/dL — ABNORMAL HIGH (ref 0.40–1.50)
Glucose, Bld: 88 mg/dL (ref 70–99)
Potassium: 4.4 mEq/L (ref 3.5–5.3)
Sodium: 142 mEq/L (ref 135–145)
Total Bilirubin: 0.4 mg/dL (ref 0.3–1.2)
Total Protein: 6.7 g/dL (ref 6.0–8.3)

## 2009-11-25 LAB — URIC ACID: Uric Acid, Serum: 8 mg/dL — ABNORMAL HIGH (ref 4.0–7.8)

## 2009-11-25 LAB — BETA 2 MICROGLOBULIN, SERUM: Beta-2 Microglobulin: 2.1 mg/L — ABNORMAL HIGH (ref 1.01–1.73)

## 2010-03-02 ENCOUNTER — Ambulatory Visit: Payer: Self-pay | Admitting: Oncology

## 2010-03-04 LAB — CBC WITH DIFFERENTIAL/PLATELET
Basophils Absolute: 0 10*3/uL (ref 0.0–0.1)
HCT: 44.2 % (ref 38.4–49.9)
HGB: 15.6 g/dL (ref 13.0–17.1)
MONO#: 0.7 10*3/uL (ref 0.1–0.9)
NEUT#: 4.5 10*3/uL (ref 1.5–6.5)
NEUT%: 71.9 % (ref 39.0–75.0)
RDW: 13.5 % (ref 11.0–14.6)
WBC: 6.3 10*3/uL (ref 4.0–10.3)
lymph#: 0.9 10*3/uL (ref 0.9–3.3)

## 2010-03-05 LAB — LACTATE DEHYDROGENASE: LDH: 194 U/L (ref 94–250)

## 2010-03-05 LAB — COMPREHENSIVE METABOLIC PANEL WITH GFR
ALT: 23 U/L (ref 0–53)
AST: 28 U/L (ref 0–37)
Albumin: 4.3 g/dL (ref 3.5–5.2)
Alkaline Phosphatase: 66 U/L (ref 39–117)
BUN: 18 mg/dL (ref 6–23)
CO2: 24 meq/L (ref 19–32)
Calcium: 9.2 mg/dL (ref 8.4–10.5)
Chloride: 105 meq/L (ref 96–112)
Creatinine, Ser: 1.25 mg/dL (ref 0.40–1.50)
Glucose, Bld: 80 mg/dL (ref 70–99)
Potassium: 4.6 meq/L (ref 3.5–5.3)
Sodium: 141 meq/L (ref 135–145)
Total Bilirubin: 0.5 mg/dL (ref 0.3–1.2)
Total Protein: 6.9 g/dL (ref 6.0–8.3)

## 2010-03-05 LAB — BETA 2 MICROGLOBULIN, SERUM: Beta-2 Microglobulin: 2.09 mg/L — ABNORMAL HIGH (ref 1.01–1.73)

## 2010-11-29 LAB — CBC
HCT: 46.1 % (ref 39.0–52.0)
Hemoglobin: 10.6 g/dL — ABNORMAL LOW (ref 13.0–17.0)
Hemoglobin: 11.8 g/dL — ABNORMAL LOW (ref 13.0–17.0)
Hemoglobin: 15.7 g/dL (ref 13.0–17.0)
MCHC: 34 g/dL (ref 30.0–36.0)
MCHC: 34.7 g/dL (ref 30.0–36.0)
MCV: 99.5 fL (ref 78.0–100.0)
Platelets: 141 10*3/uL — ABNORMAL LOW (ref 150–400)
RBC: 3.43 MIL/uL — ABNORMAL LOW (ref 4.22–5.81)
RBC: 4.63 MIL/uL (ref 4.22–5.81)
RDW: 13 % (ref 11.5–15.5)
WBC: 5.4 10*3/uL (ref 4.0–10.5)
WBC: 6.2 10*3/uL (ref 4.0–10.5)

## 2010-11-29 LAB — CROSSMATCH
ABO/RH(D): O NEG
Antibody Screen: NEGATIVE

## 2010-11-29 LAB — URINALYSIS, ROUTINE W REFLEX MICROSCOPIC
Glucose, UA: NEGATIVE mg/dL
Hgb urine dipstick: NEGATIVE
Ketones, ur: NEGATIVE mg/dL
Protein, ur: NEGATIVE mg/dL
Urobilinogen, UA: 1 mg/dL (ref 0.0–1.0)

## 2010-11-29 LAB — DIFFERENTIAL
Basophils Absolute: 0.1 10*3/uL (ref 0.0–0.1)
Eosinophils Absolute: 0.4 10*3/uL (ref 0.0–0.7)
Lymphocytes Relative: 17 % (ref 12–46)
Lymphs Abs: 1.1 10*3/uL (ref 0.7–4.0)
Neutrophils Relative %: 67 % (ref 43–77)

## 2010-11-29 LAB — GLUCOSE, CAPILLARY: Glucose-Capillary: 129 mg/dL — ABNORMAL HIGH (ref 70–99)

## 2010-11-29 LAB — PROTIME-INR
INR: 0.9 (ref 0.00–1.49)
Prothrombin Time: 12.2 seconds (ref 11.6–15.2)

## 2010-11-29 LAB — COMPREHENSIVE METABOLIC PANEL
CO2: 25 mEq/L (ref 19–32)
Calcium: 8.9 mg/dL (ref 8.4–10.5)
Chloride: 102 mEq/L (ref 96–112)
Creatinine, Ser: 1.17 mg/dL (ref 0.4–1.5)
GFR calc non Af Amer: >60 mL/min (ref >60)
Glucose, Bld: 93 mg/dL (ref 70–99)
Total Bilirubin: 0.8 mg/dL (ref 0.3–1.2)

## 2010-11-29 LAB — BASIC METABOLIC PANEL
CO2: 27 mEq/L (ref 19–32)
Calcium: 8.3 mg/dL — ABNORMAL LOW (ref 8.4–10.5)
Calcium: 8.5 mg/dL (ref 8.4–10.5)
Creatinine, Ser: 1.07 mg/dL (ref 0.4–1.5)
GFR calc Af Amer: >60 mL/min (ref >60)
GFR calc non Af Amer: 58 mL/min — ABNORMAL LOW (ref >60)
GFR calc non Af Amer: >60 mL/min (ref >60)
Glucose, Bld: 114 mg/dL — ABNORMAL HIGH (ref 70–99)
Potassium: 3 mEq/L — ABNORMAL LOW (ref 3.5–5.1)
Sodium: 132 mEq/L — ABNORMAL LOW (ref 135–145)
Sodium: 138 mEq/L (ref 135–145)

## 2010-11-29 LAB — URINE CULTURE: Culture: NO GROWTH

## 2010-12-21 ENCOUNTER — Other Ambulatory Visit: Payer: Self-pay | Admitting: Oncology

## 2010-12-21 DIAGNOSIS — C8590 Non-Hodgkin lymphoma, unspecified, unspecified site: Secondary | ICD-10-CM

## 2010-12-22 ENCOUNTER — Other Ambulatory Visit: Payer: Self-pay | Admitting: Oncology

## 2010-12-22 ENCOUNTER — Encounter (HOSPITAL_BASED_OUTPATIENT_CLINIC_OR_DEPARTMENT_OTHER): Payer: Medicare Other | Admitting: Oncology

## 2010-12-22 DIAGNOSIS — C8589 Other specified types of non-Hodgkin lymphoma, extranodal and solid organ sites: Secondary | ICD-10-CM

## 2010-12-22 LAB — CBC WITH DIFFERENTIAL/PLATELET
Basophils Absolute: 0 10*3/uL (ref 0.0–0.1)
Eosinophils Absolute: 0.2 10*3/uL (ref 0.0–0.5)
HCT: 41.5 % (ref 38.4–49.9)
HGB: 14.4 g/dL (ref 13.0–17.1)
MCV: 97.9 fL (ref 79.3–98.0)
NEUT#: 3 10*3/uL (ref 1.5–6.5)
NEUT%: 61.6 % (ref 39.0–75.0)
RDW: 13 % (ref 11.0–14.6)
lymph#: 1 10*3/uL (ref 0.9–3.3)

## 2010-12-23 LAB — COMPREHENSIVE METABOLIC PANEL
Albumin: 4.3 g/dL (ref 3.5–5.2)
BUN: 21 mg/dL (ref 6–23)
Calcium: 9 mg/dL (ref 8.4–10.5)
Chloride: 104 mEq/L (ref 96–112)
Creatinine, Ser: 1.07 mg/dL (ref 0.40–1.50)
Glucose, Bld: 72 mg/dL (ref 70–99)
Potassium: 4.5 mEq/L (ref 3.5–5.3)

## 2010-12-23 LAB — LACTATE DEHYDROGENASE: LDH: 149 U/L (ref 94–250)

## 2010-12-23 LAB — BETA 2 MICROGLOBULIN, SERUM: Beta-2 Microglobulin: 1.45 mg/L (ref 1.01–1.73)

## 2010-12-28 ENCOUNTER — Encounter (HOSPITAL_COMMUNITY): Payer: Self-pay

## 2010-12-28 ENCOUNTER — Ambulatory Visit (HOSPITAL_COMMUNITY)
Admission: RE | Admit: 2010-12-28 | Discharge: 2010-12-28 | Disposition: A | Discharge status: Home / Self Care | Payer: Medicare Other | Source: Ambulatory Visit | Attending: Oncology | Admitting: Oncology

## 2010-12-28 ENCOUNTER — Other Ambulatory Visit: Payer: Self-pay | Admitting: Oncology

## 2010-12-28 DIAGNOSIS — K7689 Other specified diseases of liver: Secondary | ICD-10-CM | POA: Insufficient documentation

## 2010-12-28 DIAGNOSIS — C8590 Non-Hodgkin lymphoma, unspecified, unspecified site: Secondary | ICD-10-CM

## 2010-12-28 DIAGNOSIS — J984 Other disorders of lung: Secondary | ICD-10-CM | POA: Insufficient documentation

## 2010-12-28 DIAGNOSIS — Z87898 Personal history of other specified conditions: Secondary | ICD-10-CM | POA: Insufficient documentation

## 2010-12-28 DIAGNOSIS — K449 Diaphragmatic hernia without obstruction or gangrene: Secondary | ICD-10-CM | POA: Insufficient documentation

## 2010-12-28 DIAGNOSIS — J449 Chronic obstructive pulmonary disease, unspecified: Secondary | ICD-10-CM | POA: Insufficient documentation

## 2010-12-28 DIAGNOSIS — J4489 Other specified chronic obstructive pulmonary disease: Secondary | ICD-10-CM | POA: Insufficient documentation

## 2010-12-28 HISTORY — DX: Malignant (primary) neoplasm, unspecified: C80.1

## 2010-12-28 MED ORDER — IOHEXOL 300 MG/ML  SOLN
80.0000 mL | Freq: Once | INTRAMUSCULAR | Status: AC | PRN
  Administered 2010-12-28: 80 mL via INTRAVENOUS

## 2010-12-28 NOTE — Op Note (Signed)
NAME:  Albert Hahn, Albert Hahn               ACCOUNT NO.:  [id number]   MEDICAL RECORD NO.:  [id number]          PATIENT TYPE:  INP   LOCATION:  5005                         FACILITY:  [facility]   PHYSICIAN:  Estill Bamberg. Ronnie Derby, M.D. DATE OF BIRTH:  1946-07-21   DATE OF PROCEDURE:  08/25/2008  DATE OF DISCHARGE:                               OPERATIVE REPORT   SURGEON:  Estill Bamberg. Ronnie Derby, MD   ASSISTANTS:  1. Lowell Guitar. Mancel Bale, PA-C  2. Carlynn Spry, PA-C   ANESTHESIA:  General.   PREOPERATIVE DIAGNOSIS:  Left hip osteoarthritis.   POSTOPERATIVE DIAGNOSIS:  Left hip osteoarthritis.   PROCEDURE:  Left total hip arthroplasty.   INDICATIONS FOR PROCEDURE:  The patient is a 65 year old white male with  failure of conservative measures for osteoarthritis of the hip.  Informed consent was obtained.   DESCRIPTION OF PROCEDURE:  The patient was laid supine, administered  general anesthesia, and Foley catheter was placed.  The patient was  placed in right down and left up lateral decubitus position.  Hip was  prepped and draped in the usual sterile fashion.  Curvilinear incision  was made approximately 7 inches in length centered over the greater  trochanter and a curvilinear incision in a [place] style.  I then used  cautery dissection to go down to and through the IT band.  Posterior  Charnley retractor was placed to retract the IT band.  I then incised  the anterior one-half of the vastus lateralis and medius minimus and  created a single sleeve of tissue and elevated it with stay sutures.  I  then did anterior hip capsulectomy.  I then marked out the femoral neck  cut and made that cut with sagittal saw.  I then placed towels anterior-  posterior and the acetabulum and cleaned out the labrum, the ligamentum  mucosum, and osteophytes.  I then reamed up to 56 and placed a 58, no  holes, no spike, fully fiber mesh cup and a 2-mm offset liner.  I then  went back to the back side of the table and  externally rotated the leg  flexed to the knee into a sterile pouch.  I gained access to the femoral  canal and then lateralized with a lateralized reamer.  I then  sequentially reamed up to 14 mm.  I then broached to 14 and placed in  various head sizes.  I felt that a +35 was appropriate.  I then took it  to extreme ranges of motion only and extension with external rotation  was in the anterior instability.  I then removed the trial components  and copiously irrigated.  I then attempted in a fully porous-coated 14  stem, put on a +35 ball, and located the hip.  I then repaired the  abductor sleeve down through drill holes and oversewing a figure-of-  eight #2 Tevdek sutures.  I then irrigated and closed the IT band with  running #1 Vicryls, deep soft tissues with buried 0-Vicryl, subcuticular  2-0 Vicryl, and skin staples.   COMPLICATIONS:  None.   DRAINS:  None.  ESTIMATED BLOOD LOSS:  300 mL.           ______________________________  Estill Bamberg. Ronnie Derby, M.D.     SDL/MEDQ  D:  08/25/2008  T:  08/26/2008  Job:  [id number]

## 2010-12-31 ENCOUNTER — Encounter (HOSPITAL_BASED_OUTPATIENT_CLINIC_OR_DEPARTMENT_OTHER): Payer: Medicare Other | Admitting: Oncology

## 2010-12-31 DIAGNOSIS — C8589 Other specified types of non-Hodgkin lymphoma, extranodal and solid organ sites: Secondary | ICD-10-CM

## 2010-12-31 NOTE — Consult Note (Signed)
NAME:  Albert, Hahn               ACCOUNT NO.:  [id number]   MEDICAL RECORD NO.:  [id number]          PATIENT TYPE:  INP   LOCATION:  3030                         FACILITY:  [facility]   PHYSICIAN:  Virgie Dad. Magrinat, M.D.DATE OF BIRTH:  05-28-1946   DATE OF CONSULTATION:  07/04/2006  DATE OF DISCHARGE:                                 CONSULTATION   Albert Hahn is a 65 year old [place], [place], man referred by  Dr. 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   The patient has had worsening low back pain for many months.  He has  been treated with narcotics, but for the last few weeks, the patient's  pain has become unbearable.  He was admitted with this chief complaint  on June 26, 2006, and an MRI on the same day showed a mass at L3.  Note that a prior MRI obtained on March 2002, showed only mild facet  hypertrophy at L 3/4.  Other studies this admission have included CT  scans of the chest, abdomen and pelvis obtained June 27, 2006.  This  showed bilateral enlarged axillary lymph nodes and supraclavicular lymph  nodes as well as a few borderline enlarged retroperitoneal lymph nodes.  There was a 3.8 cm enlarged obturator lymph node on the right side of  the pelvis.  Some other incidental findings included a small hiatal  hernia, atherosclerotic cardiovascular disease with calcifications of  the aorta, a prominent extra renal pelvis on the left without  significant hydronephrosis.   The biopsy of the inguinal lymph node was attempted on June 28, 2006, and showed ([id number]) some necrotic tissue with  inadequate sample for flow cytometry.  On June 30, 2006, the patient  had a bone biopsy per Dr. Estanislado Pandy of L3 disc [phone]) again showing  some atypical cells and necrotic tissue but nothing diagnostic.   Accordingly earlier today, July 04, 2006, Dr. Vertell Limber, after  appropriate discussion with the  patient and family, proceeded to  [place] vertebroplasty with extraforaminal nerve root decompression.  The patient tolerated the surgery well.  The preliminary report from Dr.  Neldon Mc tells me that this is a small blue cell tumor.  She thinks  it is going to prove to be intermediate grade or high grade lymphoma but  not a Burkitt's lymphoma.  Flow cytometry and final pathology are  pending at this time.   PAST MEDICAL HISTORY:  1. Hypertension  2. History of colonic polyps.  3. History of basal cell tumor.  4. Again, a mild diverticulosis per prior colonoscopy.  5. Small hiatal hernia per recent CT.  6. History of BPH.  7. A left extrarenal pelvis prominence.  8. Atherosclerotic cardiovascular disease.   FAMILY HISTORY:  The patient's father died from colon cancer at the age  of 41.  The patient's father's brother, his paternal uncle, died of  colon cancer at the age of 6.  The patient's father had five sisters,  one died with breast cancer and one died with lung cancer.  There was no  other colon cancer in the family.  The patient's mother died from  Alzheimer's disease and a stroke at the age of 41.  The patient is one  of seven siblings, one of whom died from sepsis in the setting of  diverticular disease.  There is no history of colon cancer in the  siblings, so far at least.   SOCIAL HISTORY:  The patient is 100% disabled as a [place] veteran.  He had been exposed to agent orange. He is followed at the [facility] in [place], [place], through the primary care clinic.  His  wife of 63 years, Albert Hahn, is present tonight as is the patient's sister,  Albert Hahn.  Joseph Art is the head nurse at [employer].  The patient's daughters  are Albert Hahn, who lives in [place], [place], near [place], [place]. She is 64 years old and is studying to be a Marine scientist.  She has  three children.  His second daughter is Albert Hahn, 16, lives in  [place], [place], a very good  Orthoptist for [employer], according to the patient, no children so far.   ALLERGIES:  The patient is intolerant of CODEINE.   CURRENT MEDICATIONS:  Lovenox, Valium, Neurontin, Dilaudid, Wellbutrin,  Restoril, Tylenol, Phenergan, Dulcolax, Tylox and Benadryl.   MEDICINES PRIOR TO ADMISSION:  1. Temazepam 15 mg at bedtime.  2. Valium 5 mg as needed.  3. Gabapentin 300 mg once a day.  4. Vicodin one or two tablets as needed.  5. Epipen as needed.  6. Wellbutrin 150 mg daily.   HEALTH MAINTENANCE:  The patient's most recent colonoscopy was performed  at the [place] in 2005.  He has a 70 pack-year smoking history but quit about  9 years ago.  Drinking is social only.  He tells me his cholesterol is  okay.  He says his prostate was checked at the [facility] about a  year ago.  He was told he had BPH and was started on Hytrin for that,  although he is not on that.   REVIEW OF SYSTEMS:  He is doing remarkably well postop with good pain  control, alert, although he falls asleep easily during our discussion.  He denies unusual headaches, cough, phlegm production or pleurisy.  He  is somewhat constipated, but he is working hard to get bowel movements  and he said he was just cleaned out.  He denies fevers at home, rash,  drenching night sweats or other symptoms suggestive of lymphoma.   PHYSICAL EXAMINATION:  VITAL SIGNS:  He is a middle-aged white male with  a maximum temperature yesterday of 100.4, heart rate 80, respiratory  rate 18, blood pressure 125/85, and his oxygen saturation is 98%.  I do  not palpate any cervical or supraclavicular or axillary adenopathy.  LUNGS:  Anteriorly were clear.  HEART:  Regular rate and rhythm.  ABDOMEN:  Benign.  The patient was examined supine.   LABORATORY DATA:  Lab work this admission has included SPEP with  immunofixation pending on July 03, 2006.  BMET showed a sodium of 135, potassium 4.3, chloride 102, CO2 27, BUN 18, creatinine 1.1  and  calcium 9.  His counts showed a white cell count of 5.7, hemoglobin  12.9, MCV 92.4 and platelets 235,000.   Films as already described.   EKG:  She had normal sinus rhythm with left atrial enlargement noted on  July 04, 2006.   IMPRESSION AND PLAN:  A 65 year old [place],  [place], man status  post partial L3 vertebrectomy and extraforaminal nerve root compression  on July 04, 2006 for what is currently being described as a small  cell tumor and most likely an aggressive lymphoma.   I met with the patient, his wife and sister, Joseph Art, for approximately 1  hour this evening to orient them to his situation.  He understands I do  not have a final pathology report and I really need to do that if we are  going to be able to make any plans.  He does need an echocardiogram,  this could conceivably be obtained this admission.  He needs to be set  up for an outpatient PET scan and I will arrange for that the middle of  next week.  He has a follow-up ointment with the on July 14, 2006,  at 3:00 p.m.  I expect at that time we will have enough data that we can  make some treatment decisions.      Virgie Dad. Magrinat, M.D.  Electronically Signed     GCM/MEDQ  D:  07/04/2006  T:  07/05/2006  Job:  [id number]   cc:   Marchia Meiers. Vertell Limber, M.D.  attn:  [facility]

## 2010-12-31 NOTE — Discharge Summary (Signed)
NAME:  Albert Hahn, Albert Hahn               ACCOUNT NO.:  [id number]   MEDICAL RECORD NO.:  [id number]          PATIENT TYPE:  INP   LOCATION:  3030                         FACILITY:  [facility]   PHYSICIAN:  Sheila Oats, M.D.DATE OF BIRTH:  [birth year]   DATE OF ADMISSION:  06/26/2006  DATE OF DISCHARGE:  07/11/2006                               DISCHARGE SUMMARY   DISCHARGE DIAGNOSES:  1. High grade Lymphoma, small cell tumor, status post partial L3      vertebrectomy and extraforaminal nerve root decompression, per Dr.      Vertell Limber.  2. Probable urinary tract infection.  3. Left upper thigh cellulitis.  4. History of anxiety.  5. History of colon polyps, status post removal less than 2 years ago.   PROCEDURES AND STUDIES:  1. Status post partial vertebrectomy and extraforaminal nerve root      decompression at L3 on July 04, 2006, per Dr. Vertell Limber.  2. Ultrasound guided biopsy core biopsy of the right inguinal lymph      node per interventional radiology on June 28, 2006.  3. Bone scan, mild abnormal uptake at L3 in the region of the known      mass, mild uptake consistent with degenerative changes in the left      hip, knees, ankle, and shoulders.  4. CT scan of abdomen and pelvis with 1.  Enlarged axillary lymph      nodes, bulky 3-cm node in the right axilla.  2. No mediastinal or      hilar adenopathy.  3. Coronary artery calcifications noted.  4.      Small vague nodular density in the posterior segment of the upper      lobe along the 2 other vague ground glass opacities.  Findings      nonspecific but may reflect mild inflammatory change.  5. A CT scan of the abdomen with no acute abdominal findings and      normal CT appearance of the solid parenchymal organs.  Benign      appearing calcified granuloma noted in the liver.  Borderline      enlarged left periaortic retroperitoneal lymph nodes.  Prominent      extra-renal pelvis left kidney but no hydronephrosis.  And, CT of      the pelvis showed a 3.8 x 2.8-cm right obturator lymph node and      small  pelvic nodes.   HISTORY:  The patient is a 65 year old white male with a history of  anxiety, chronic back pain who presented with complaints of back pain  and difficulty with his gait.  According to the patient, he has been  having trouble with his back for many years and has had epidurals.  Several months prior to this presentation he had an MRI of his back and  as recently as 3-4 weeks ago he had x-rays and injections for pain in  his hip and back.  However, in the couple of weeks prior to this  admission the pain had progressively worsened from the left and the  right and he has been having trouble  with his gait.  He described it as  he was shuffling.  He also described pain shooting down the back of his  hip.  Because of the progressive nature of the symptoms, he presented to  the ER for further evaluation.  The patient ultimately had an MRI which  showed an L3 mass, no fracture, and neurosurgery was consulted.  She was  admitted to the [facility] service for further evaluation and  management.   PHYSICAL EXAMINATION:  GENERAL:  Upon admission as per Dr. Maudry Mayhew  revealed that he was afebrile and hemodynamically stable and.  NEUROLOGIC:  Cranial nerves II-XII grossly intact.  And his strength was  5/5.  It was noted that there may have been some weakness secondary to  pain on the left side.  Gait was not tested.  Deep tendon reflexes  difficult to elicit.   His MRI showed an L3 mass without fracture.   LABORATORY DATA:  Showed a white cell count of 4.2 with a hemoglobin of  14.1, hematocrit 40.4, platelet count of 177, and his sodium was 140  with a potassium of 4, chloride of 109, CO2 of 25, glucose of 151, BUN  13, creatinine of 1.1, calcium of 8.7.  And, his AST 44, ALT 17,  alkaline phosphatase 70, total bilirubin 1.  His LDH 322.   HOSPITAL COURSE:  1. Upon admission, the patient was  placed on IV narcotics for pain      management and also a bone scan was ordered and the results are as      stated above.  A CEA level was also ordered and it was 2.      Neurosurgery was consulted and Dr. Vertell Limber saw the patient.  They      recommended that a node biopsy be done by interventional radiology      and this was done as indicated above and it was non-diagnostic.  It      just showed benign fibroadipose tissue.  No lymph node tissue or      malignancy identified.  The bone biopsy of L3 vertebral body showed      focal atypical cellular infiltrate associated with an extensive      necrosis.  Following this, Dr. Vertell Limber talked with the patient and      his family and at they decided to prpceed with _ the surgery -      vertebrectomy with the extraforaminal nerve root compression and      this was done on July 04, 2006.  And, the results from that      were a high grade B cell lymphoma.  And, the flow cytometry showed      that the fissures were worrisome for a B cell lymphoproliferative      process.  Following this diagnosis oncology was consulted and Dr.      Jana Hakim saw the patient.  He recommended that a 2D echo be done      which was done and it revealed normal left ventricular systolic      function with an EF of 60%, no diagnostic evidence of left      ventricular regional wall motion abnormalities, normal aortic      valve, also normal mitral valve.  Dr. Jana Hakim also following      discussion with the patient and his wife stated that he needed to      be set up for an outpatient PET scan which he scheduled  for      July 13, 2006 at 9 a.m. at [facility] and the patient is also      to follow up with Dr. Jana Hakim on July 14, 3006 at 2:30 p.m.      and they will discuss chemo options at that time.  Following      surgery, PT and OT followed the patient in the hospital.  He has     been ambulating and with dressing changes as per neurosurgery.  He      has also  been using an elastic lumbar support which he is to      continue using at home.  He is to follow up with neurosurgery at      the office for removal of the staples in one week.  2. Urinary tract infection.  The patient was noted to be febrile in      the hospital stay and a urinalysis was done and this was consistent      with a UTI.  He was then empirically started on antibiotics and he      has remained afebrile and hemodynamically stable.  The cultures      were sent but no organisms grew.  He has completed adequate      antibiotics during this hospital stay for the UTI.  3. Left upper thigh cellulitis.  The patient was noted to have      increased redness and swelling on the lateral aspect of his left      upper thigh and he was empirically started on antibiotics and his      symptoms have improved.  He is to continue the Keflex as directed      and follow up with Dr. Vertell Limber and his primary care physician in      [place].   DISCHARGE MEDICATIONS:  1. Dilaudid 4 mg p.o. q.4-6h. p.r.n.  2. Keflex 500 mg p.o. q.i.d. x10 days.   FOLLOWUP CARE:  [place] Magrinat, M.D. on July 14, 2006 at 2 p.m. as      indicated above.  2. [facility] for PET scan July 13, 2006 at 9 a.m.  3. Marchia Meiers. Vertell Limber, M.D. in one week.  4. His primary care physician, Dr. Willey Blade, in [place] in 1-2 weeks.   SPECIAL INSTRUCTIONS:  As given per neurosurgery.   DISCHARGE CONDITION:  Improved/stable.      Sheila Oats, M.D.  Electronically Signed     ACV/MEDQ  D:  07/11/2006  T:  07/11/2006  Job:  [id number]   cc:   Paula Compton. Willey Blade, MD  Marchia Meiers Vertell Limber, M.D.  Virgie Dad. Magrinat, M.D.

## 2010-12-31 NOTE — H&P (Signed)
NAME:  JAHMARLEY, CUTRER               ACCOUNT NO.:  [id number]   MEDICAL RECORD NO.:  [id number]          PATIENT TYPE:  EMS   LOCATION:  MAJO                         FACILITY:  [facility]   PHYSICIAN:  Ashby Dawes. Polite, M.D. DATE OF BIRTH:  [birth year]   DATE OF ADMISSION:  06/26/2006  DATE OF DISCHARGE:                              HISTORY & PHYSICAL   CHIEF COMPLAINT:  Back pain. Trouble with gait.   HISTORY OF PRESENT ILLNESS:  A 65 year old male with known history of  anxiety, chronic back pain who presents to the ED with above chief  complaint.  According to the patient he has been having trouble with his  back for many years.  On several occasions has had epidural's.  As far  as 7 month ago states he has had an MRI of his back and as recently as 3-  4 weeks ago has had x-rays and injections for pain in his hip and back.  However, over the last couple weeks the patient's pain has gotten  progressive worse from the left and the right and has been having some  trouble with his gait that he describes as shuffling of his gait.  He  describes pain as shooting down the back of his hip.  Because of the  progressive nature of those symptoms the patient presented to the ED for  evaluation.  In the ED the patient was afebrile.  He was dynamically  stable.  The patient had x-rays ultimately MRI which showed a L3 without  significant fracture.  Neurosurgery was consulted.  The patient was seen  in consultation in the ED by neurosurgery.  Recommended the patient be  admitted for metastatic workup and also oncology evaluation also  recommended internal medicine in the patient.  At the time the patient  is alert and oriented x3.  No prior stress.  The total events are as  stated above.  The patient has no known history of __________ , however,  does have a strong family history of colon CA.  Father with colon CA.  Brother with colon CA as well as a uncle with colon CA.  The patient had  a colonoscopy  approximately 2 years ago.  The patient stated he has had  multiple polyps removed before at one time with suspicious pathology.  The patient denies any bowel or bladder dysfunction.  No recent changes  in his bowel habits.  Because of the patient's MRI abnormality,  progressive pain and gait dysfunction admission is deemed necessary for  further evaluation.   PAST MEDICAL HISTORY:  Significant for anxiety, excessive colon polyps  which have been removed less than 2 years ago.   MEDICATIONS:  1. Diazepam.  2. Bupropion.  3. Temazepam.  4. Ibuprofen.  5. Benadryl.  6. Neurontin.   SOCIAL HISTORY:  Negative for tobacco, social alcohol and no drugs.   PAST MEDICAL HISTORY:  1. Right coronary __________ 1970's.  2. Hemorrhoidectomy in the past.  3. Left foot surgery in the distant past.   ALLERGIES:  Shellfish which is __________ .   FAMILY HISTORY:  Mother  with diabetes, CVA, Alzheimer's.  Father colon  CA, brother with colon CA, sister with diabetes and uncle with colon CA.   REVIEW OF SYSTEMS:  Stated in the HPI.   PHYSICAL EXAMINATION:  VITALS:  Stable, afebrile.  HEENT:  Within normal limits.  CHEST:  Clear.  CARDIOVASCULAR:  Regular.  ABDOMEN: Soft, nontender.  EXTREMITIES:  No edema.  NEURO EXAM:  Cranial nerves intact, motor 5/5, bilateral.  The patient  may have some weakness secondary to pain on the left side.  Gait not  tested.  Deep tendon reflexes difficult to illicit as the patient has  difficulty relaxing.   Data as stated in the HPI, abnormal MRI L3 __________ without  significant fracture.  Other labs are pending.   ASSESSMENT:  1. L3 __________ MRI.  2. History of chronic back pain.  3. Anxiety. Recommend the patient to be admitted to medicine floor      bed.  The patient has been seen in consultation by neurosurgery and      I have discussed the case with neurosurgeon.  Discussed the case      with Dr. Vertell Limber who states that at this  time did not feel  that      steroids are indicated.  Does feel that pain control and metastatic      workup are indicated at this time.  Will obtain bone scan and CT of      the chest, abdomen and pelvis.  With a history of colon CA will      also obtain a CEA.  Will make further recommendations as deemed      necessary.      Ashby Dawes. Polite, M.D.  Electronically Signed     RDP/MEDQ  D:  06/26/2006  T:  06/26/2006  Job:  [id number]

## 2011-06-30 ENCOUNTER — Other Ambulatory Visit (HOSPITAL_COMMUNITY): Payer: Self-pay | Admitting: Internal Medicine

## 2011-06-30 DIAGNOSIS — M5412 Radiculopathy, cervical region: Secondary | ICD-10-CM

## 2011-07-01 ENCOUNTER — Ambulatory Visit (HOSPITAL_COMMUNITY)
Admission: RE | Admit: 2011-07-01 | Discharge: 2011-07-01 | Disposition: A | Discharge status: Home / Self Care | Payer: Medicare Other | Source: Ambulatory Visit | Attending: Internal Medicine | Admitting: Internal Medicine

## 2011-07-01 DIAGNOSIS — R209 Unspecified disturbances of skin sensation: Secondary | ICD-10-CM | POA: Insufficient documentation

## 2011-07-01 DIAGNOSIS — M5412 Radiculopathy, cervical region: Secondary | ICD-10-CM

## 2011-07-01 DIAGNOSIS — Z87898 Personal history of other specified conditions: Secondary | ICD-10-CM | POA: Insufficient documentation

## 2011-07-01 DIAGNOSIS — M542 Cervicalgia: Secondary | ICD-10-CM | POA: Insufficient documentation

## 2011-07-01 DIAGNOSIS — M5020 Other cervical disc displacement, unspecified cervical region: Secondary | ICD-10-CM | POA: Insufficient documentation

## 2011-07-01 DIAGNOSIS — M5030 Other cervical disc degeneration, unspecified cervical region: Secondary | ICD-10-CM | POA: Insufficient documentation

## 2011-07-18 ENCOUNTER — Other Ambulatory Visit: Payer: Self-pay | Admitting: Oncology

## 2011-07-18 DIAGNOSIS — C8590 Non-Hodgkin lymphoma, unspecified, unspecified site: Secondary | ICD-10-CM

## 2011-08-04 ENCOUNTER — Telehealth: Payer: Self-pay

## 2011-08-04 NOTE — Telephone Encounter (Signed)
Received call from pt's wife stating that she needs of "a copy of" when Dr. Jana Hakim diagnosed pt with cancer and where it was, for [place] purposes.  Called her back [phone], and she states this does not need to be an Scientist, clinical (histocompatibility and immunogenetics), just an office progress note.  Informed her request will be given to medical records, and they will contact her.  She verbalizes understanding.

## 2011-12-22 ENCOUNTER — Other Ambulatory Visit (HOSPITAL_BASED_OUTPATIENT_CLINIC_OR_DEPARTMENT_OTHER): Payer: Medicare Other | Admitting: Lab

## 2011-12-22 ENCOUNTER — Ambulatory Visit (HOSPITAL_COMMUNITY)
Admission: RE | Admit: 2011-12-22 | Discharge: 2011-12-22 | Disposition: A | Discharge status: Home / Self Care | Payer: Medicare Other | Source: Ambulatory Visit | Attending: Oncology | Admitting: Oncology

## 2011-12-22 DIAGNOSIS — C8590 Non-Hodgkin lymphoma, unspecified, unspecified site: Secondary | ICD-10-CM

## 2011-12-22 DIAGNOSIS — I2510 Atherosclerotic heart disease of native coronary artery without angina pectoris: Secondary | ICD-10-CM | POA: Insufficient documentation

## 2011-12-22 DIAGNOSIS — C8589 Other specified types of non-Hodgkin lymphoma, extranodal and solid organ sites: Secondary | ICD-10-CM

## 2011-12-22 LAB — CMP (CANCER CENTER ONLY)
ALT(SGPT): 28 U/L (ref 10–47)
AST: 36 U/L (ref 11–38)
Albumin: 3.5 g/dL (ref 3.3–5.5)
Alkaline Phosphatase: 69 U/L (ref 26–84)
Calcium: 9.1 mg/dL (ref 8.0–10.3)
Chloride: 100 mEq/L (ref 98–108)
Potassium: 4.7 mEq/L (ref 3.3–4.7)
Sodium: 141 mEq/L (ref 128–145)
Total Protein: 7 g/dL (ref 6.4–8.1)

## 2011-12-22 LAB — CBC WITH DIFFERENTIAL/PLATELET
BASO%: 0.7 % (ref 0.0–2.0)
EOS%: 4.6 % (ref 0.0–7.0)
HCT: 45.3 % (ref 38.4–49.9)
HGB: 15.5 g/dL (ref 13.0–17.1)
MCH: 33.3 pg (ref 27.2–33.4)
MCHC: 34.2 g/dL (ref 32.0–36.0)
MONO#: 0.5 10*3/uL (ref 0.1–0.9)
NEUT%: 55.7 % (ref 39.0–75.0)
RDW: 13.8 % (ref 11.0–14.6)
WBC: 3.9 10*3/uL — ABNORMAL LOW (ref 4.0–10.3)
lymph#: 1 10*3/uL (ref 0.9–3.3)
nRBC: 0 % (ref 0–0)

## 2011-12-22 MED ORDER — IOHEXOL 300 MG/ML  SOLN
80.0000 mL | Freq: Once | INTRAMUSCULAR | Status: AC | PRN
  Administered 2011-12-22: 80 mL via INTRAVENOUS

## 2011-12-29 ENCOUNTER — Ambulatory Visit (HOSPITAL_BASED_OUTPATIENT_CLINIC_OR_DEPARTMENT_OTHER): Payer: Medicare Other | Admitting: Oncology

## 2011-12-29 VITALS — BP 145/89 | HR 64 | Temp 97.8°F | Ht 67.0 in | Wt 195.1 lb

## 2011-12-29 DIAGNOSIS — C8590 Non-Hodgkin lymphoma, unspecified, unspecified site: Secondary | ICD-10-CM | POA: Insufficient documentation

## 2011-12-29 DIAGNOSIS — C8589 Other specified types of non-Hodgkin lymphoma, extranodal and solid organ sites: Secondary | ICD-10-CM

## 2011-12-29 NOTE — Progress Notes (Signed)
IDCalob Hodor Gotto   DOB: May 11, 1946  MR#: [id number]  [id number]  HISTORY OF PRESENT ILLNESS: The patient has had worsening low back pain for many months.  He has been treated with narcotics, but for the last few weeks, the patient's pain has become unbearable.  He was admitted with this chief complaint on June 26, 2006, and an MRI on the same day showed a mass at L3.  Note that a prior MRI obtained on March 2002, showed only mild facet hypertrophy at L 3/4.  Other studies this admission have included CT scans of the chest, abdomen and pelvis obtained June 27, 2006.  This showed bilateral enlarged axillary lymph nodes and supraclavicular lymph nodes as well as a few borderline enlarged retroperitoneal lymph nodes.  There was a 3.8 cm enlarged obturator lymph node on the right side of the pelvis.  Some other incidental findings included a small hiatal hernia, atherosclerotic cardiovascular disease with calcifications of the aorta, a prominent extra renal pelvis on the left without significant hydronephrosis.   Biopsy of the inguinal lymph node was attempted on June 28, 2006, and showed ([id number] and [id number]) some necrotic tissue with inadequate sample for flow cytometry.  On June 30, 2006, the patient had a bone biopsy per Dr. Estanislado Pandy of L3 disc [phone]) again showing some atypical cells and necrotic tissue but nothing diagnostic.   Accordingly July 04, 2006, Dr. Vertell Limber, after appropriate discussion with the patient and family, proceeded to partial L3 vertebroplasty with extraforaminal nerve root decompression.  The patient tolerated the surgery well.  The preliminary report tells me that this is "a small blue cell tumor.  She thinks it is going to prove to be intermediate grade or high grade lymphoma but not a Burkitt's lymphoma.  Flow cytometry and final pathology confirmed a diagnosis of non-Hodgkin's lymphoma.  The patient's subsequent history is as detailed below.  INTERVAL  HISTORY: Albert Hahn returns today with his wife Albert Hahn for followup of his non-Hodgkin's lymphoma. The interval history is significant for his having undergone fusion of C2-C5 about 3 months ago. He is wearing a flexible collar today.  REVIEW OF SYSTEMS: He denies fevers, drenching sweats, unexplained fatigue or weight loss, adenopathy, rash, pruritus, or other "B." symptoms. He continues to have some lower back and neck pain. He has some sinus problems. He has some of urinary dribbling. He feels forgetful, anxious, and depressed. Overall however a detailed review of systems was noncontributory.  PAST MEDICAL HISTORY: Past Medical History  Diagnosis Date  . nhl dx'd 07/2006    xrt/ chemo comp 12/2006  1. Hypertension 2. History of colonic polyps.  3. History of basal cell tumor.  4. Again, a mild diverticulosis per prior colonoscopy. 5. Small hiatal hernia per recent CT.  6. History of BPH. 7. A left extrarenal pelvis prominence. Atherosclerotic cardiovascular disease.  FAMILY HISTORY The patient's father died from colon cancer at the age of 6.  The patient's father's brother, Muaz's paternal uncle, died of colon cancer at the age of 37.  The patient's father had five sisters, one died with breast cancer and one died with lung cancer.  There was no other colon cancer in the family.  The patient's mother died from Alzheimer's disease and a stroke at the age of 25.  The patient is one of seven siblings, one of whom died from sepsis in the setting of diverticular disease.  There is no history of colon cancer in the siblings, so far as the patient is  aware  SOCIAL HISTORY: The patient is "100% disabled" as a [place] veteran.  He had been exposed to agent orange. He is followed at the [facility] in [place], [place], through the primary care clinic.  The patient's daughters are Albert Hahn, who lives in [place], [place], near [place],  and is studying to be a Marine scientist.  She has three children.   His second daughter is Albert Hahn, lives in [place], and is a "very good Designer, industrial/product for [employer], according to the patient  ADVANCED DIRECTIVES: in place  HEALTH MAINTENANCE: History  Substance Use Topics  . Smoking status: Not on file  . Smokeless tobacco: Not on file  . Alcohol Use: Not on file    Allergies  Allergen Reactions  . Codeine     Current Outpatient Prescriptions  Medication Sig Dispense Refill  . buPROPion (WELLBUTRIN SR) 100 MG 12 hr tablet Take 100 mg by mouth 2 (two) times daily.      . hydrochlorothiazide (MICROZIDE) 12.5 MG capsule Take 12.5 mg by mouth daily.      . temazepam (RESTORIL) 15 MG capsule Take 15 mg by mouth at bedtime as needed.      . terazosin (HYTRIN) 1 MG capsule Take 1 mg by mouth at bedtime.        OBJECTIVE: Middle-aged white male in no acute distress Filed Vitals:   12/29/11 1356  BP: 145/89  Pulse: 64  Temp: 97.8 F (36.6 C)     Body mass index is 30.56 kg/(m^2).    ECOG FS: 2  Sclerae unicteric Oropharynx clear No cervical, supraclavicular, axillary, or inguinal adenopathy Lungs no rales or rhonchi Heart regular rate and rhythm Abd benign MSK no focal spinal tenderness, no peripheral edema Neuro: nonfocal  LAB RESULTS: Lab Results  Component Value Date   WBC 3.9* 12/22/2011   NEUTROABS 2.2 12/22/2011   HGB 15.5 12/22/2011   HCT 45.3 12/22/2011   MCV 97.6 12/22/2011   PLT 153 12/22/2011      Chemistry      Component Value Date/Time   NA 141 12/22/2011 0950   NA 136 12/22/2010 1004   NA 136 12/22/2010 1004   NA 136 12/22/2010 1004   K 4.7 12/22/2011 0950   K 4.5 12/22/2010 1004   K 4.5 12/22/2010 1004   K 4.5 12/22/2010 1004   CL 100 12/22/2011 0950   CL 104 12/22/2010 1004   CL 104 12/22/2010 1004   CL 104 12/22/2010 1004   CO2 28 12/22/2011 0950   CO2 24 12/22/2010 1004   CO2 24 12/22/2010 1004   CO2 24 12/22/2010 1004   BUN 21 12/22/2011 0950   BUN 21 12/22/2010 1004   BUN 21 12/22/2010 1004   BUN 21 12/22/2010 1004   CREATININE 1.4*  12/22/2011 0950   CREATININE 1.07 12/22/2010 1004   CREATININE 1.07 12/22/2010 1004   CREATININE 1.07 12/22/2010 1004      Component Value Date/Time   CALCIUM 9.1 12/22/2011 0950   CALCIUM 9.0 12/22/2010 1004   CALCIUM 9.0 12/22/2010 1004   CALCIUM 9.0 12/22/2010 1004   ALKPHOS 69 12/22/2011 0950   ALKPHOS 68 12/22/2010 1004   ALKPHOS 68 12/22/2010 1004   ALKPHOS 68 12/22/2010 1004   AST 36 12/22/2011 0950   AST 27 12/22/2010 1004   AST 27 12/22/2010 1004   AST 27 12/22/2010 1004   ALT 20 12/22/2010 1004   ALT 20 12/22/2010 1004   ALT 20 12/22/2010 1004  BILITOT 0.70 12/22/2011 0950   BILITOT 0.4 12/22/2010 1004   BILITOT 0.4 12/22/2010 1004   BILITOT 0.4 12/22/2010 1004       No results found for this basename: LABCA2    No components found with this basename: LABCA125    No results found for this basename: INR:1;PROTIME:1 in the last 168 hours  Urinalysis    Component Value Date/Time   COLORURINE YELLOW 08/20/2008 1340   APPEARANCEUR CLOUDY* 08/20/2008 1340   LABSPEC 1.024 08/20/2008 1340   LABSPEC 1.025 09/06/2006 1104   PHURINE 5.5 08/20/2008 1340   GLUCOSEU NEGATIVE 08/20/2008 1340   HGBUR NEGATIVE 08/20/2008 1340   BILIRUBINUR NEGATIVE 08/20/2008 1340   KETONESUR NEGATIVE 08/20/2008 1340   PROTEINUR NEGATIVE 08/20/2008 1340   UROBILINOGEN 1.0 08/20/2008 1340   NITRITE NEGATIVE 08/20/2008 1340   LEUKOCYTESUR NEGATIVE MICROSCOPIC NOT DONE ON URINES WITH NEGATIVE PROTEIN, BLOOD, LEUKOCYTES, NITRITE, OR GLUCOSE <1000 mg/dL. 08/20/2008 1340    STUDIES: Ct Chest W Contrast  12/22/2011  *RADIOLOGY REPORT*  Clinical Data: Lymphoma.  CT CHEST WITH CONTRAST  Technique:  Multidetector CT imaging of the chest was performed following the standard protocol during bolus administration of intravenous contrast.  Contrast: 59mL OMNIPAQUE IOHEXOL 300 MG/ML  SOLN  Comparison: 12/28/2010.  Findings: No pathologically enlarged mediastinal, hilar or axillary lymph nodes.  Heart size normal.  Coronary artery calcification is fairly extensive.  No  pericardial effusion.  Scattered tiny nodules and scarring in the lungs bilaterally, stable.  Lungs are otherwise clear.  No pleural fluid.  Airway is unremarkable.  Incidental imaging of the upper abdomen shows no acute findings. No worrisome lytic or sclerotic lesions.  IMPRESSION:  1.  No evidence of recurrent lymphoma. 2.  Prominent coronary artery calcification.  Original Report Authenticated By: Luretha Rued, M.D.    ASSESSMENT: 66 year old Albert Hahn, [place] man status post partial vertebrectomy and extraforaminal nerve root decompression of L3 November 2007 for what proved to be a high-grade B-cell non-Hodgkin's lymphoma stage III-IV, with a preop ECOG performance status of 1, an elevated LDH at presentation, and an international prognostic index in the high to intermediate range.  He was treated with 6 cycles of  CHOP/Rituxan completed in April 2008 and received 1 cycle of Rituxan maintenance completed in November 2008.   PLAN: Albert Hahn has done very well and at this point uncomfortable releasing him to his primary care physician, Dr. Willey Blade. I gave him a copy of his labs and CT scan so he can share them with his physician at [facility] as well. Oba knows that we'll be glad to see him in the future if the need arises but as of now we are scheduling no further appointments for him here.   Aamya Orellana C    12/29/2011

## 2012-07-22 ENCOUNTER — Emergency Department (HOSPITAL_COMMUNITY)
Admission: EM | Admit: 2012-07-22 | Discharge: 2012-07-22 | Disposition: A | Discharge status: Home / Self Care | Payer: Medicare Other | Attending: Emergency Medicine | Admitting: Emergency Medicine

## 2012-07-22 ENCOUNTER — Encounter (HOSPITAL_COMMUNITY): Payer: Self-pay | Admitting: *Deleted

## 2012-07-22 ENCOUNTER — Emergency Department (HOSPITAL_COMMUNITY): Payer: Medicare Other

## 2012-07-22 DIAGNOSIS — R51 Headache: Secondary | ICD-10-CM | POA: Insufficient documentation

## 2012-07-22 DIAGNOSIS — Z79899 Other long term (current) drug therapy: Secondary | ICD-10-CM | POA: Insufficient documentation

## 2012-07-22 LAB — CBC WITH DIFFERENTIAL/PLATELET
Basophils Relative: 0 % (ref 0–1)
Eosinophils Relative: 3 % (ref 0–5)
Lymphocytes Relative: 29 % (ref 12–46)
MCHC: 35.9 g/dL (ref 30.0–36.0)
MCV: 93.9 fL (ref 78.0–100.0)
Platelets: 139 10*3/uL — ABNORMAL LOW (ref 150–400)
RDW: 12.9 % (ref 11.5–15.5)
WBC: 5.2 10*3/uL (ref 4.0–10.5)

## 2012-07-22 LAB — BASIC METABOLIC PANEL
BUN: 14 mg/dL (ref 6–23)
Chloride: 101 mEq/L (ref 96–112)
Creatinine, Ser: 0.96 mg/dL (ref 0.50–1.35)
Glucose, Bld: 113 mg/dL — ABNORMAL HIGH (ref 70–99)
Potassium: 3.6 mEq/L (ref 3.5–5.1)

## 2012-07-22 MED ORDER — METOCLOPRAMIDE HCL 5 MG/ML IJ SOLN
10.0000 mg | Freq: Once | INTRAMUSCULAR | Status: AC
  Administered 2012-07-22: 10 mg via INTRAVENOUS
  Filled 2012-07-22: qty 2

## 2012-07-22 MED ORDER — DIPHENHYDRAMINE HCL 50 MG/ML IJ SOLN
50.0000 mg | Freq: Once | INTRAMUSCULAR | Status: AC
  Administered 2012-07-22: 50 mg via INTRAVENOUS
  Filled 2012-07-22: qty 1

## 2012-07-22 MED ORDER — OXYCODONE-ACETAMINOPHEN 5-325 MG PO TABS: ORAL_TABLET | ORAL | Status: DC

## 2012-07-22 MED ORDER — KETOROLAC TROMETHAMINE 30 MG/ML IJ SOLN
30.0000 mg | Freq: Once | INTRAMUSCULAR | Status: AC
  Administered 2012-07-22: 30 mg via INTRAVENOUS
  Filled 2012-07-22: qty 1

## 2012-07-22 MED ORDER — SODIUM CHLORIDE 0.9 % IV SOLN
INTRAVENOUS | Status: DC
  Administered 2012-07-22: 19:00:00 via INTRAVENOUS

## 2012-07-22 NOTE — ED Notes (Signed)
Reports headache x 20 days and hypertension.  Seen PCP x 6 days ago and given abx for sinus infection.  States pain got worse last night and today.

## 2012-07-22 NOTE — ED Provider Notes (Signed)
History     CSN: [id number]  Arrival date & time 07/22/12  1707   First MD Initiated Contact with Patient 07/22/12 1811      Chief Complaint  Patient presents with  . Hypertension  . Headache    (Consider location/radiation/quality/duration/timing/severity/associated sxs/prior treatment) HPI  Patient reports about 20 days ago after having had bronchitis and a sinus infection he started getting a slowly progressive worsening headache that has been persistent for the past 20 days. He states it's frontal and posterior and he states it's a sinus headache. However he denies any nasal congestion or rhinorrhea at this point. He states it waxes and wanes and seems to get worse at night. He states sometimes if he stands up fast or he sits down the pain will get worse. He also started having photophobia today. The pain is described as throbbing. He denies nausea, vomiting, blurred vision, feeling off balance, nasal congestion rhinorrhea, sore throat, coughing, neck pain, or fever. He denies any injury. He was seen by his PCP 6 days ago and was placed on Augmentin which he thought was helping after 3 days however the pain has gotten worse. He also states she's been having problems controlling his blood pressure with his Hytrin. He denies any family history of headaches and he has never had headaches before. He states a friend gave him Zomig which he took without relief. He took 2 hydrocodone last night and was finally able to sleep.  PCP Dr. Willey Blade  Past Medical History  Diagnosis Date  . nhl dx'd 07/2006    xrt/ chemo comp 12/2006   B-cell non-Hodgkin's lymphoma, finished chemotherapy 5 years ago  Past Surgical History  Procedure Date  . Hip surgery     No family history on file.  History  Substance Use Topics  . Smoking status: Never Smoker   . Smokeless tobacco: Not on file  . Alcohol Use: 0.6 oz/week    1 Glasses of wine per week     Comment: daily   Lives at home Lives with  spouse  Review of Systems  All other systems reviewed and are negative.    Allergies  Codeine and Shellfish allergy  Home Medications   Current Outpatient Rx  Name  Route  Sig  Dispense  Refill  . BUPROPION HCL ER (SR) 100 MG PO TB12   Oral   Take 100 mg by mouth 2 (two) times daily.         Marland Kitchen HYDROCHLOROTHIAZIDE 12.5 MG PO CAPS   Oral   Take 12.5 mg by mouth daily.         Marland Kitchen TEMAZEPAM 15 MG PO CAPS   Oral   Take 15 mg by mouth at bedtime as needed. sleep         . TERAZOSIN HCL 1 MG PO CAPS   Oral   Take 1 mg by mouth at bedtime.           BP 162/97  Pulse 62  Temp 97.4 F (36.3 C) (Oral)  Resp 18  Ht 5\' 6"  (1.676 m)  Wt 186 lb (84.369 kg)  BMI 30.02 kg/m2  SpO2 100%  Vital signs normal except hypertension   Physical Exam  Nursing note and vitals reviewed. Constitutional: He is oriented to person, place, and time. He appears well-developed and well-nourished.  Non-toxic appearance. He does not appear ill. No distress.       Appears painful  HENT:  Head: Normocephalic and atraumatic.  Right Ear:  External ear normal.  Left Ear: External ear normal.  Nose: Nose normal. No mucosal edema or rhinorrhea.  Mouth/Throat: Oropharynx is clear and moist and mucous membranes are normal. No dental abscesses or uvula swelling.       Very tender over the temples and diffusely in the forehead. He is also tender to palpation of the scalp.  Eyes: Conjunctivae normal and EOM are normal. Pupils are equal, round, and reactive to light.  Neck: Normal range of motion and full passive range of motion without pain. Neck supple.  Cardiovascular: Normal rate, regular rhythm and normal heart sounds.  Exam reveals no gallop and no friction rub.   No murmur heard. Pulmonary/Chest: Effort normal and breath sounds normal. No respiratory distress. He has no wheezes. He has no rhonchi. He has no rales. He exhibits no tenderness and no crepitus.  Abdominal: Soft. Normal appearance  and bowel sounds are normal. He exhibits no distension. There is no tenderness. There is no rebound and no guarding.  Musculoskeletal: Normal range of motion. He exhibits no edema and no tenderness.       Moves all extremities well.   Neurological: He is alert and oriented to person, place, and time. He has normal strength. No cranial nerve deficit.  Skin: Skin is warm, dry and intact. No rash noted. No erythema. No pallor.  Psychiatric: His speech is normal and behavior is normal. His mood appears not anxious.       anxious    ED Course  Procedures (including critical care time)   Medications  0.9 %  sodium chloride infusion (  Intravenous New Bag/Given 07/22/12 1918)  metoCLOPramide (REGLAN) injection 10 mg (10 mg Intravenous Given 07/22/12 1919)  diphenhydrAMINE (BENADRYL) injection 50 mg (50 mg Intravenous Given 07/22/12 1919)  ketorolac (TORADOL) 30 MG/ML injection 30 mg (30 mg Intravenous Given 07/22/12 2040)    Recheck at 20:25 headache much improved after reglan and benadryl. Will add toradol  Recheck at 21:50 headache is gone. Pt scheduled to get diagnostic lumbar puncture tomorrow morning at 9 am. Have discussed possible treatments based on his study tomorrow.  Pt's SED RATE is low so not c/w temporal arterititis   Results for orders placed during the hospital encounter of 07/22/12  CBC WITH DIFFERENTIAL      Component Value Range   WBC 5.2  4.0 - 10.5 K/uL   RBC 4.93  4.22 - 5.81 MIL/uL   Hemoglobin 16.6  13.0 - 17.0 g/dL   HCT 46.3  39.0 - 52.0 %   MCV 93.9  78.0 - 100.0 fL   MCH 33.7  26.0 - 34.0 pg   MCHC 35.9  30.0 - 36.0 g/dL   RDW 12.9  11.5 - 15.5 %   Platelets 139 (*) 150 - 400 K/uL   Neutrophils Relative 57  43 - 77 %   Neutro Abs 2.9  1.7 - 7.7 K/uL   Lymphocytes Relative 29  12 - 46 %   Lymphs Abs 1.5  0.7 - 4.0 K/uL   Monocytes Relative 11  3 - 12 %   Monocytes Absolute 0.6  0.1 - 1.0 K/uL   Eosinophils Relative 3  0 - 5 %   Eosinophils Absolute 0.2   0.0 - 0.7 K/uL   Basophils Relative 0  0 - 1 %   Basophils Absolute 0.0  0.0 - 0.1 K/uL  BASIC METABOLIC PANEL      Component Value Range   Sodium 137  135 - 145  mEq/L   Potassium 3.6  3.5 - 5.1 mEq/L   Chloride 101  96 - 112 mEq/L   CO2 25  19 - 32 mEq/L   Glucose, Bld 113 (*) 70 - 99 mg/dL   BUN 14  6 - 23 mg/dL   Creatinine, Ser 0.96  0.50 - 1.35 mg/dL   Calcium 9.9  8.4 - 10.5 mg/dL   GFR calc non Af Amer 85 (*) >90 mL/min   GFR calc Af Amer >90  >90 mL/min  SEDIMENTATION RATE      Component Value Range   Sed Rate 3  0 - 16 mm/hr   Laboratory interpretation all normal     Ct Head Wo Contrast  07/22/2012  *RADIOLOGY REPORT*  Clinical Data: Headache.  Weakness. History of non-Hodgkins lymphoma.  CT HEAD WITHOUT CONTRAST  Technique:  Contiguous axial images were obtained from the base of the skull through the vertex without contrast.  Comparison: None.  Findings: No mass lesion, mass effect, midline shift, hydrocephalus, hemorrhage.  No acute territorial cortical ischemia/infarct. Atrophy and chronic ischemic white matter disease is present. Ventricular enlargement is out of portion to the size of the sulci, raising the possibility of normal pressure hydrocephalus.  Visualized paranasal sinuses demonstrate mucous retention cyst or polyp in the right sphenoid. There is no dilation of the temporal horns.  IMPRESSION: 1.  No acute intracranial abnormality. 2.  Ventricular enlargement out of proportion to enlargement of the sulci raising possibility of normal pressure hydrocephalus.   Original Report Authenticated By: Dereck Ligas, M.D.      1. Headache    New Prescriptions   OXYCODONE-ACETAMINOPHEN (PERCOCET/ROXICET) 5-325 MG PER TABLET    Take 1 or 2 po Q 6hrs for pain    Plan discharge to return in am for diagnostic Lumbar puncture   Rolland Porter, MD, [place], MD 07/22/12 2206

## 2012-07-23 ENCOUNTER — Ambulatory Visit (HOSPITAL_COMMUNITY)
Admit: 2012-07-23 | Discharge: 2012-07-23 | Disposition: A | Discharge status: Home / Self Care | Payer: Medicare Other | Attending: Emergency Medicine | Admitting: Emergency Medicine

## 2012-07-23 ENCOUNTER — Encounter (HOSPITAL_COMMUNITY): Payer: Self-pay

## 2012-07-23 DIAGNOSIS — R51 Headache: Secondary | ICD-10-CM | POA: Insufficient documentation

## 2012-07-23 HISTORY — DX: Essential (primary) hypertension: I10

## 2012-07-23 HISTORY — DX: Headache: R51

## 2012-07-23 HISTORY — PX: LUMBAR PUNCTURE: SHX1985

## 2012-07-23 LAB — CSF CELL COUNT WITH DIFFERENTIAL
RBC Count, CSF: 82 /mm3 — ABNORMAL HIGH
WBC, CSF: 1 /mm3 (ref 0–5)

## 2012-07-23 LAB — GLUCOSE, CSF: Glucose, CSF: 61 mg/dL (ref 43–76)

## 2012-07-23 MED ORDER — DIPHENHYDRAMINE HCL 25 MG PO TABS
25.0000 mg | ORAL_TABLET | Freq: Once | ORAL | Status: AC
  Administered 2012-07-23: 25 mg via ORAL
  Filled 2012-07-23: qty 1

## 2012-07-23 MED ORDER — ACETAMINOPHEN 325 MG PO TABS
650.0000 mg | ORAL_TABLET | ORAL | Status: DC | PRN
  Filled 2012-07-23: qty 2

## 2012-07-23 MED FILL — Oxycodone w/ Acetaminophen Tab 5-325 MG: ORAL | Qty: 6 | Status: AC

## 2012-07-23 NOTE — Procedures (Signed)
Successful fluoro-guided lumbar puncture at L4-5.  10 mL clear CSF withdrawn and sent for laboratory evaluation.  No immediate complications.

## 2012-07-23 NOTE — Progress Notes (Signed)
1424  Patient d/c'd with wife ambulatory in stable condition. VSS.   D/c instructions given and pt. verbalized understanding.  Continued to have headache 5/10 pain scale.  He had crackers and 2 diet cokes prior to d/c and tolerated well.

## 2012-07-26 LAB — CSF CULTURE W GRAM STAIN

## 2012-07-27 LAB — MULTIPLE SCLEROSIS PANEL 2
Albumin Index: 4.5 (ref <9.0)
CNS-IgG Synthesis Rate: <0.1 mg/24hr (ref <3.3)
IgA Total: 367 mg/dL (ref 81–463)
IgG Total CSF: 2.1 mg/dL (ref 0.5–6.1)
IgG Total: 859 mg/dL (ref 694–1618)
Myelin basic protein, csf: <2 mcg/L (ref <4.1)

## 2012-11-06 ENCOUNTER — Emergency Department (HOSPITAL_COMMUNITY)
Admission: EM | Admit: 2012-11-06 | Discharge: 2012-11-06 | Disposition: A | Discharge status: Home / Self Care | Payer: Medicare Other | Attending: Emergency Medicine | Admitting: Emergency Medicine

## 2012-11-06 ENCOUNTER — Encounter (HOSPITAL_COMMUNITY): Payer: Self-pay | Admitting: *Deleted

## 2012-11-06 ENCOUNTER — Emergency Department (HOSPITAL_COMMUNITY): Payer: Medicare Other

## 2012-11-06 DIAGNOSIS — H1130 Conjunctival hemorrhage, unspecified eye: Secondary | ICD-10-CM | POA: Insufficient documentation

## 2012-11-06 DIAGNOSIS — Y92009 Unspecified place in unspecified non-institutional (private) residence as the place of occurrence of the external cause: Secondary | ICD-10-CM | POA: Insufficient documentation

## 2012-11-06 DIAGNOSIS — IMO0002 Reserved for concepts with insufficient information to code with codable children: Secondary | ICD-10-CM | POA: Insufficient documentation

## 2012-11-06 DIAGNOSIS — H1131 Conjunctival hemorrhage, right eye: Secondary | ICD-10-CM

## 2012-11-06 DIAGNOSIS — Y9389 Activity, other specified: Secondary | ICD-10-CM | POA: Insufficient documentation

## 2012-11-06 MED ORDER — TETRACAINE HCL 0.5 % OP SOLN
OPHTHALMIC | Status: AC
  Filled 2012-11-06: qty 2

## 2012-11-06 MED ORDER — ACETAMINOPHEN 500 MG PO TABS
1000.0000 mg | ORAL_TABLET | Freq: Once | ORAL | Status: AC
  Administered 2012-11-06: 1000 mg via ORAL
  Filled 2012-11-06: qty 2

## 2012-11-06 MED ORDER — ERYTHROMYCIN 5 MG/GM OP OINT
TOPICAL_OINTMENT | Freq: Once | OPHTHALMIC | Status: AC
  Administered 2012-11-06: 22:00:00 via OPHTHALMIC
  Filled 2012-11-06: qty 3.5

## 2012-11-06 NOTE — ED Notes (Signed)
Struck rt eye against sight of rifle , conjunctival bleeding seen. Alert, vision is normal per pt

## 2012-11-06 NOTE — ED Notes (Signed)
nad noted prior to dc. Dc instructions reviewed with pt and wife. F/u appt discussed.

## 2012-11-11 NOTE — ED Provider Notes (Signed)
History     CSN: [id number]  Arrival date & time 11/06/12  [id number]   First MD Initiated Contact with Patient 11/06/12 2019      Chief Complaint  Patient presents with  . Eye Injury    (Consider location/radiation/quality/duration/timing/severity/associated sxs/prior treatment) HPI Comments: Albert Hahn is a 67 y.o. Male presenting with right eye pain,  Redness and bleeding after he accidentally hit his eye against the tip of his rifle which was propped up,  Hitting as he bent over while he was in his yard.  He reports initial pain, fleeting blurred vision and bleeding which has since resolved and persistent redness.  He denies visual changes since the event and has had no headache, dizziness, nausea or other complaint.  He reports feeling relatively comfortable at this time,  But is concerned about the redness and the blood.  He has had no treatments prior to arrival.     The history is provided by the patient.    Past Medical History  Diagnosis Date  . Hypertension   . Headache   . 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 Hip surgery    . Lumbar puncture  07/23/2012    severe headaches  . Hernia repair    . Back surgery    . Joint replacement    . Total hip arthroplasty      History reviewed. No pertinent family history.  History  Substance Use Topics  . Smoking status: Never Smoker   . Smokeless tobacco: Not on file  . Alcohol Use: 0.6 oz/week    1 Glasses of wine per week     Comment: daily      Review of Systems  Constitutional: Negative for fever.  HENT: Negative for ear pain, congestion, sore throat, facial swelling, rhinorrhea and neck pain.   Eyes: Positive for redness. Negative for photophobia, discharge and visual disturbance.  Respiratory: Negative for shortness of breath.   Cardiovascular: Negative for chest pain.  Gastrointestinal: Negative for nausea.  Genitourinary: Negative.    Musculoskeletal: Negative for joint swelling and arthralgias.  Skin: Negative.  Negative for rash and wound.  Neurological: Negative for dizziness, light-headedness and headaches.  Psychiatric/Behavioral: Negative.     Allergies  Bee venom; Shellfish allergy; and Codeine  Home Medications   Current Outpatient Rx  Name  Route  Sig  Dispense  Refill  . buPROPion (WELLBUTRIN SR) 100 MG 12 hr tablet   Oral   Take 100 mg by mouth 2 (two) times daily.         . hydrochlorothiazide (MICROZIDE) 12.5 MG capsule   Oral   Take 12.5 mg by mouth daily.         Marland Kitchen oxyCODONE-acetaminophen (PERCOCET/ROXICET) 5-325 MG per tablet   Oral   Take 1-2 tablets by mouth every 6 (six) hours as needed. Take 1 or 2 po Q 6hrs for pain         . temazepam (RESTORIL) 15 MG capsule   Oral   Take 15 mg by mouth at bedtime as needed. sleep         . terazosin (HYTRIN) 1 MG capsule   Oral   Take 1 mg by mouth at bedtime.           BP 155/94  Temp(Src) 98.1 F (36.7 C) (Oral)  Ht 5' 6.5" (1.689 m)  Wt 193 lb (87.544 kg)  BMI 30.69  kg/m2  SpO2 98%  Physical Exam  Constitutional: He is oriented to person, place, and time. He appears well-developed and well-nourished.  HENT:  Head: Normocephalic and atraumatic.  Right Ear: Tympanic membrane and ear canal normal.  Left Ear: Tympanic membrane and ear canal normal.  Nose: Mucosal edema and rhinorrhea present.  Mouth/Throat: Uvula is midline, oropharynx is clear and moist and mucous membranes are normal. No oropharyngeal exudate, posterior oropharyngeal edema, posterior oropharyngeal erythema or tonsillar abscesses.  Eyes: EOM are normal. Pupils are equal, round, and reactive to light. Right eye exhibits no discharge. Left eye exhibits no discharge. Right conjunctiva has a hemorrhage.  Slit lamp exam:      The right eye shows no corneal abrasion, no corneal flare, no hyphema, no fluorescein uptake and no anterior chamber bulge.  Extensive  lateral subconjunctival hemorrhage.  Slit exam revealing for a small conjunctival laceration lateral inferior eye.  Cardiovascular: Normal rate and normal heart sounds.   Pulmonary/Chest: Effort normal. No respiratory distress. He has no wheezes. He has no rales.  Abdominal: Soft. There is no tenderness.  Musculoskeletal: Normal range of motion.  Neurological: He is alert and oriented to person, place, and time.  Skin: Skin is warm and dry. No rash noted.  Psychiatric: He has a normal mood and affect.   Visual acuity 0s/od/ou 20/25   ED Course  Procedures (including critical care time)  Labs Reviewed - No data to display No results found.   1. Subconjunctival hemorrhage, traumatic, right       MDM  Spoke with Dr. Iona Hansen who will see pt in his office tomorrow for a recheck.  He does not advise patching the eye.  He would given abx - erythromycin  Ointment provided,  First dose given in ed.        Evalee Jefferson, PA-C 11/11/12 2230  Evalee Jefferson, PA-C 11/11/12 2230

## 2012-11-12 NOTE — ED Provider Notes (Signed)
Medical screening examination/treatment/procedure(s) were performed by non-physician practitioner and as supervising physician I was immediately available for consultation/collaboration. Rolland Porter, MD, FACEP   Janice Norrie, MD 11/12/12 [phone]

## 2013-01-18 ENCOUNTER — Emergency Department (HOSPITAL_COMMUNITY): Payer: Medicare Other

## 2013-01-18 ENCOUNTER — Emergency Department (HOSPITAL_COMMUNITY)
Admission: EM | Admit: 2013-01-18 | Discharge: 2013-01-18 | Disposition: A | Discharge status: Home / Self Care | Payer: Medicare Other | Attending: Emergency Medicine | Admitting: Emergency Medicine

## 2013-01-18 ENCOUNTER — Encounter (HOSPITAL_COMMUNITY): Payer: Self-pay | Admitting: *Deleted

## 2013-01-18 DIAGNOSIS — Z79899 Other long term (current) drug therapy: Secondary | ICD-10-CM | POA: Insufficient documentation

## 2013-01-18 DIAGNOSIS — S0560XA Penetrating wound without foreign body of unspecified eyeball, initial encounter: Secondary | ICD-10-CM | POA: Insufficient documentation

## 2013-01-18 DIAGNOSIS — S0532XA Ocular laceration without prolapse or loss of intraocular tissue, left eye, initial encounter: Secondary | ICD-10-CM

## 2013-01-18 DIAGNOSIS — W268XXA Contact with other sharp object(s), not elsewhere classified, initial encounter: Secondary | ICD-10-CM | POA: Insufficient documentation

## 2013-01-18 DIAGNOSIS — Y929 Unspecified place or not applicable: Secondary | ICD-10-CM | POA: Insufficient documentation

## 2013-01-18 DIAGNOSIS — Y9389 Activity, other specified: Secondary | ICD-10-CM | POA: Insufficient documentation

## 2013-01-18 DIAGNOSIS — S0183XA Puncture wound without foreign body of other part of head, initial encounter: Secondary | ICD-10-CM

## 2013-01-18 DIAGNOSIS — I10 Essential (primary) hypertension: Secondary | ICD-10-CM | POA: Insufficient documentation

## 2013-01-18 DIAGNOSIS — H538 Other visual disturbances: Secondary | ICD-10-CM | POA: Insufficient documentation

## 2013-01-18 MED ORDER — HYDROCODONE-ACETAMINOPHEN 5-325 MG PO TABS
2.0000 | ORAL_TABLET | Freq: Once | ORAL | Status: AC
  Administered 2013-01-18: 2 via ORAL
  Filled 2013-01-18: qty 2

## 2013-01-18 MED ORDER — TETRACAINE HCL 0.5 % OP SOLN
1.0000 [drp] | Freq: Once | OPHTHALMIC | Status: AC
  Administered 2013-01-18: 1 [drp] via OPHTHALMIC
  Filled 2013-01-18: qty 2

## 2013-01-18 MED ORDER — FLUORESCEIN SODIUM 1 MG OP STRP
1.0000 | ORAL_STRIP | Freq: Once | OPHTHALMIC | Status: DC
  Filled 2013-01-18: qty 1

## 2013-01-18 NOTE — ED Notes (Signed)
Pt states a splinter went into corner of left eye and he was able to get it out. States blurred vision to the eye

## 2013-01-18 NOTE — ED Notes (Signed)
CT called, will come to get patient shortly.

## 2013-01-18 NOTE — ED Provider Notes (Signed)
History  This chart was scribed for Janice Norrie, MD by Jeralyn Ruths, ED Scribe. The patient was seen in room APA05/APA05 and the patient's care was started at 11:31 AM.  CSN: [id number]  Arrival date & time 01/18/13  1004    Chief Complaint  Patient presents with  . Eye Injury    The history is provided by the patient, medical records and a friend. No language interpreter was used.   HPI Comments: Albert Hahn is a 67 y.o. male who presents to the Emergency Department complaining of sudden, constant, moderate to severe pain to left eye after falling into a splintered cedar post about 2 hours ago. He reports puncture wound, but states the splintered pieces stayed on the post..  He reports blurred vision that has improved since the incident.  Pt states he has a mild headache, but most pain is in his left eye.  He reports that there was some blood in eye after injury, but it is not there anymore. He states the blood was coming out like tears. He denies any other injury.    Pt denies diaphoresis, fever, chills, nausea, vomiting, diarrhea, weakness, cough, SOB and any other pain. He denies LOC or headache.    Pt states tetanus is UTD.  Pt denies smoking.   PCP is Dr. Willey Blade. Also goes to the [facility]  Past Medical History  Diagnosis Date  . Hypertension   . Headache(784.0)   . 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 Hip surgery    . Lumbar puncture  07/23/2012    severe headaches  . Hernia repair    . Back surgery    . Joint replacement    . Total hip arthroplasty      No family history on file.  History  Substance Use Topics  . Smoking status: Never Smoker   . Smokeless tobacco: Not on file  . Alcohol Use: 0.6 oz/week    1 Glasses of wine per week     Comment: daily   Lives at home Lives with wife   Review of Systems  Constitutional: Negative for fever and chills.  HENT: Negative for sore throat and sneezing.    Eyes: Positive for pain and visual disturbance. Negative for itching.  Respiratory: Negative for cough and shortness of breath.   Cardiovascular: Negative for chest pain and palpitations.  Gastrointestinal: Negative for nausea, vomiting and diarrhea.  Skin: Negative for pallor and rash.  All other systems reviewed and are negative.    Allergies  Bee venom; Shellfish allergy; and Codeine  Home Medications   Current Outpatient Rx  Name  Route  Sig  Dispense  Refill  . buPROPion (WELLBUTRIN SR) 100 MG 12 hr tablet   Oral   Take 100 mg by mouth 2 (two) times daily.         . hydrochlorothiazide (MICROZIDE) 12.5 MG capsule   Oral   Take 12.5 mg by mouth daily.         Marland Kitchen oxyCODONE-acetaminophen (PERCOCET/ROXICET) 5-325 MG per tablet   Oral   Take 1-2 tablets by mouth every 6 (six) hours as needed. Take 1 or 2 po Q 6hrs for pain         . temazepam (RESTORIL) 15 MG capsule   Oral   Take 15 mg by mouth at bedtime as needed. sleep         .  terazosin (HYTRIN) 1 MG capsule   Oral   Take 1 mg by mouth at bedtime.           Triage Vitals: BP 145/102  Pulse 89  Temp(Src) 98 F (36.7 C) (Oral)  Resp 16  Ht 5\' 7"  (1.702 m)  Wt 193 lb (87.544 kg)  BMI 30.22 kg/m2  SpO2 97%  Vital signs normal   Physical Exam  Nursing note and vitals reviewed. Constitutional: He is oriented to person, place, and time. He appears well-developed and well-nourished.  Non-toxic appearance. He does not appear ill. No distress.  HENT:  Head: Normocephalic and atraumatic.    Right Ear: External ear normal.  Left Ear: External ear normal.  Nose: Nose normal. No mucosal edema or rhinorrhea.  Mouth/Throat: Oropharynx is clear and moist and mucous membranes are normal. No dental abscesses or edematous.  Eyes: EOM are normal. Pupils are equal, round, and reactive to light. Left eye exhibits no discharge and no exudate. Left conjunctiva is injected.    Non tender superior orbital rims  bilaterally.  Tender on inferior orbital rim of left eye.  1/2 cm laceration at junction of left cheek/ lower eyelid laterally.  It is not bleeding. Injection of lateral sclera of left eye with superficial laceration of sclera that is linear and runs anteriorly to posteriorly of the globe.  Neck: Normal range of motion and full passive range of motion without pain. Neck supple.  Pulmonary/Chest: Effort normal and breath sounds normal. No respiratory distress. He has no rhonchi. He exhibits no crepitus.  Abdominal: Soft. Normal appearance and bowel sounds are normal. He exhibits no distension. There is no tenderness. There is no rebound and no guarding.  Musculoskeletal: Normal range of motion. He exhibits no edema and no tenderness.  Moves all extremities well.   Neurological: He is alert and oriented to person, place, and time. He has normal strength. No cranial nerve deficit.  Skin: Skin is warm, dry and intact. No rash noted. No erythema. No pallor.  Psychiatric: He has a normal mood and affect. His speech is normal and behavior is normal. His mood appears not anxious.    ED Course  Procedures (including critical care time)  Medications  tetracaine (PONTOCAINE) 0.5 % ophthalmic solution 1 drop (1 drop Left Eye Given 01/18/13 1442)  HYDROcodone-acetaminophen (NORCO/VICODIN) 5-325 MG per tablet 2 tablet (2 tablets Oral Given 01/18/13 1212)    DIAGNOSTIC STUDIES: Oxygen Saturation is 97% on RA, adequate by my interpretation.    COORDINATION OF CARE: 11:37 AM - Discussed ED treatment with pt at bedside including CAT scan of eye and eye stain and pt agrees. Suggested follow up with eye doctor today.  10:18:25 Visual Acuity   - Bilateral Distance: 20/25 ; R Distance: 20/30 ; L Distance: 20/25    13:50 Dr Iona Hansen, Ophthalmology, states [place] is out of [place], recommends exploration by surgery or ENT to r/o retained wood FB.   14:10 Dr Ellie Lunch, Ophthalmology on call,  send to her office now.  PT  given instructions to Dr Benna Dunks office  Ct Orbitss W/o Cm  01/18/2013   *RADIOLOGY REPORT*  Clinical Data: Fall onto splintered post.  Laceration of left lower eyelid and left square.  CT ORBITS WITHOUT CONTRAST  Technique:  Multidetector CT imaging of the orbits was performed following the standard protocol without intravenous contrast.  Comparison: CT orbits 11/06/2012.  Findings: Soft tissue swelling is present along the inferior left eye lid.  There is a small locule  of gas along the lateral aspect of the left globe at the scleral attachment.  The globe is intact. The lens is located.  No foreign body is evident.  No wood products are identified.  Minimal mucosal thickening is present in the anterior ethmoid air cells.  The paranasal sinuses are otherwise clear.  The mastoid air cells are clear.  The visualized osseous skull is intact.  IMPRESSION:  1.  Soft tissue swelling along the left lower right lid. 2.  Focal preseptal locule of air along the left side of the globe. This may be secondary to the penetrating injury. 3.  No radiopaque foreign body. 4.  The globe is intact.   Original Report Authenticated By: San Morelle, M.D.     1. Puncture wound of face, initial encounter   2. Scleral laceration of left eye     Plan discharge to go to Dr Benna Dunks office for further evaluation   Rolland Porter, MD, FACEP   MDM    I personally performed the services described in this documentation, which was scribed in my presence. The recorded information has been reviewed and considered.  Rolland Porter, MD, Abram Sander    Janice Norrie, MD 01/18/13 [phone]

## 2013-12-20 DIAGNOSIS — J209 Acute bronchitis, unspecified: Secondary | ICD-10-CM | POA: Diagnosis not present

## 2014-06-10 DIAGNOSIS — M5442 Lumbago with sciatica, left side: Secondary | ICD-10-CM | POA: Diagnosis not present

## 2014-07-16 DIAGNOSIS — J440 Chronic obstructive pulmonary disease with acute lower respiratory infection: Secondary | ICD-10-CM | POA: Diagnosis not present

## 2014-09-04 DIAGNOSIS — R079 Chest pain, unspecified: Secondary | ICD-10-CM | POA: Diagnosis not present

## 2014-12-02 DIAGNOSIS — C8580 Other specified types of non-Hodgkin lymphoma, unspecified site: Secondary | ICD-10-CM | POA: Diagnosis not present

## 2014-12-02 DIAGNOSIS — Z79899 Other long term (current) drug therapy: Secondary | ICD-10-CM | POA: Diagnosis not present

## 2014-12-02 DIAGNOSIS — Z125 Encounter for screening for malignant neoplasm of prostate: Secondary | ICD-10-CM | POA: Diagnosis not present

## 2014-12-04 DIAGNOSIS — Z0001 Encounter for general adult medical examination with abnormal findings: Secondary | ICD-10-CM | POA: Diagnosis not present

## 2014-12-04 DIAGNOSIS — C8580 Other specified types of non-Hodgkin lymphoma, unspecified site: Secondary | ICD-10-CM | POA: Diagnosis not present

## 2014-12-04 DIAGNOSIS — I10 Essential (primary) hypertension: Secondary | ICD-10-CM | POA: Diagnosis not present

## 2014-12-04 DIAGNOSIS — N183 Chronic kidney disease, stage 3 (moderate): Secondary | ICD-10-CM | POA: Diagnosis not present

## 2014-12-04 DIAGNOSIS — Z23 Encounter for immunization: Secondary | ICD-10-CM | POA: Diagnosis not present

## 2015-03-02 DIAGNOSIS — Z6833 Body mass index (BMI) 33.0-33.9, adult: Secondary | ICD-10-CM | POA: Diagnosis not present

## 2015-03-02 DIAGNOSIS — R05 Cough: Secondary | ICD-10-CM | POA: Diagnosis not present

## 2015-05-28 DIAGNOSIS — H02002 Unspecified entropion of right lower eyelid: Secondary | ICD-10-CM | POA: Diagnosis not present

## 2015-05-29 DIAGNOSIS — H02002 Unspecified entropion of right lower eyelid: Secondary | ICD-10-CM | POA: Diagnosis not present

## 2016-01-01 ENCOUNTER — Telehealth: Payer: Self-pay | Admitting: Oncology

## 2016-01-01 NOTE — Telephone Encounter (Signed)
Per pt wanted his medical records to be mailed to him.

## 2016-03-21 ENCOUNTER — Telehealth: Payer: Self-pay | Admitting: Oncology

## 2016-03-21 NOTE — Telephone Encounter (Signed)
Mailed pt's medical records to the [place], [place]

## 2016-07-11 DIAGNOSIS — J441 Chronic obstructive pulmonary disease with (acute) exacerbation: Secondary | ICD-10-CM | POA: Diagnosis not present

## 2016-07-27 ENCOUNTER — Encounter: Payer: Self-pay | Admitting: Neurology

## 2016-07-27 ENCOUNTER — Ambulatory Visit (INDEPENDENT_AMBULATORY_CARE_PROVIDER_SITE_OTHER): Payer: No Typology Code available for payment source | Admitting: Neurology

## 2016-07-27 VITALS — BP 156/80 | HR 78 | Resp 18 | Ht 67.0 in | Wt 205.0 lb

## 2016-07-27 DIAGNOSIS — R0683 Snoring: Secondary | ICD-10-CM

## 2016-07-27 DIAGNOSIS — F1190 Opioid use, unspecified, uncomplicated: Secondary | ICD-10-CM | POA: Diagnosis not present

## 2016-07-27 DIAGNOSIS — F19950 Other psychoactive substance use, unspecified with psychoactive substance-induced psychotic disorder with delusions: Secondary | ICD-10-CM | POA: Diagnosis not present

## 2016-07-27 DIAGNOSIS — G4730 Sleep apnea, unspecified: Secondary | ICD-10-CM | POA: Diagnosis not present

## 2016-07-27 DIAGNOSIS — Z8659 Personal history of other mental and behavioral disorders: Secondary | ICD-10-CM | POA: Diagnosis not present

## 2016-07-27 DIAGNOSIS — J441 Chronic obstructive pulmonary disease with (acute) exacerbation: Secondary | ICD-10-CM

## 2016-07-27 DIAGNOSIS — F4310 Post-traumatic stress disorder, unspecified: Secondary | ICD-10-CM

## 2016-07-27 DIAGNOSIS — G4710 Hypersomnia, unspecified: Secondary | ICD-10-CM | POA: Diagnosis not present
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
## 2016-08-17 ENCOUNTER — Encounter (HOSPITAL_COMMUNITY): Payer: Self-pay | Admitting: Emergency Medicine

## 2016-08-17 ENCOUNTER — Emergency Department (HOSPITAL_COMMUNITY): Payer: Non-veteran care

## 2016-08-17 ENCOUNTER — Emergency Department (HOSPITAL_COMMUNITY)
Admission: EM | Admit: 2016-08-17 | Discharge: 2016-08-17 | Disposition: A | Discharge status: Home / Self Care | Payer: Non-veteran care | Attending: Emergency Medicine | Admitting: Emergency Medicine

## 2016-08-17 DIAGNOSIS — S4992XA Unspecified injury of left shoulder and upper arm, initial encounter: Secondary | ICD-10-CM | POA: Diagnosis present

## 2016-08-17 DIAGNOSIS — Z87891 Personal history of nicotine dependence: Secondary | ICD-10-CM | POA: Insufficient documentation

## 2016-08-17 DIAGNOSIS — Y9389 Activity, other specified: Secondary | ICD-10-CM | POA: Insufficient documentation

## 2016-08-17 DIAGNOSIS — Z79899 Other long term (current) drug therapy: Secondary | ICD-10-CM | POA: Diagnosis not present

## 2016-08-17 DIAGNOSIS — Y929 Unspecified place or not applicable: Secondary | ICD-10-CM | POA: Insufficient documentation

## 2016-08-17 DIAGNOSIS — I10 Essential (primary) hypertension: Secondary | ICD-10-CM | POA: Insufficient documentation

## 2016-08-17 DIAGNOSIS — M7532 Calcific tendinitis of left shoulder: Secondary | ICD-10-CM | POA: Insufficient documentation

## 2016-08-17 DIAGNOSIS — Y999 Unspecified external cause status: Secondary | ICD-10-CM | POA: Diagnosis not present

## 2016-08-17 DIAGNOSIS — X501XXA Overexertion from prolonged static or awkward postures, initial encounter: Secondary | ICD-10-CM | POA: Insufficient documentation

## 2016-08-17 MED ORDER — KETOROLAC TROMETHAMINE 60 MG/2ML IM SOLN
60.0000 mg | Freq: Once | INTRAMUSCULAR | Status: AC
  Administered 2016-08-17: 60 mg via INTRAMUSCULAR
  Filled 2016-08-17: qty 2

## 2016-08-17 NOTE — ED Triage Notes (Signed)
Pt was pulling off a christmas tree stand and heard  A pop in left shoulder. No swelling. Pt describes having numbness and tingling in arms and fingers. Pt cannot raise arm.

## 2016-08-17 NOTE — ED Provider Notes (Signed)
[facility] DEPT Provider Note   CSN: [id number] Arrival date & time: 08/17/16  1812     History   Chief Complaint Chief Complaint  Patient presents with  . Arm Injury    HPI Albert Hahn is a 71 y.o. male.  HPI Patient has chronic left shoulder pain for which she is followed at the [place]. He is yet to see an orthopedist. States he was pulling a tree earlier in her knee pop initial shoulder. He had immediate pain. He's having difficulty abducting the arm and complaining of tingling to the left third and fourth digits. Denies any other injuries. Past Medical History:  Diagnosis Date  . Anxiety   . Depression   . H/O emphysema   . Headache(784.0)   . Hypertension   . nhl dx'd 07/2006   xrt/ chemo comp 12/2006    Patient Active Problem List   Diagnosis Date Noted  . Non Hodgkin's lymphoma ([place]) 12/29/2011    Past Surgical History:  Procedure Laterality Date  . BACK SURGERY    . HERNIA REPAIR    . HIP SURGERY    . JOINT REPLACEMENT    . LUMBAR PUNCTURE  07/23/2012   severe headaches  . TOTAL HIP ARTHROPLASTY         Home Medications    Prior to Admission medications   Medication Sig Start Date End Date Taking? Authorizing Provider  acetaminophen (TYLENOL) 500 MG tablet Take 500-1,000 mg by mouth every 6 (six) hours as needed for mild pain or moderate pain.   Yes Historical Provider, MD  buPROPion (WELLBUTRIN SR) 100 MG 12 hr tablet Take 100 mg by mouth 2 (two) times daily.   Yes Historical Provider, MD  docusate sodium (COLACE) 100 MG capsule Take 100 mg by mouth daily as needed for mild constipation.   Yes Historical Provider, MD  hydrochlorothiazide (MICROZIDE) 12.5 MG capsule Take 12.5 mg by mouth daily.   Yes Historical Provider, MD  oxyCODONE-acetaminophen (PERCOCET/ROXICET) 5-325 MG per tablet Take 1-2 tablets by mouth every 6 (six) hours as needed.  07/22/12  Yes Rolland Porter, MD  temazepam (RESTORIL) 15 MG capsule Take 15 mg by mouth at bedtime as needed.  sleep   Yes Historical Provider, MD  terazosin (HYTRIN) 1 MG capsule Take 1 mg by mouth 2 (two) times daily.    Yes Historical Provider, MD    Family History History reviewed. No pertinent family history.  Social History Social History  Substance Use Topics  . Smoking status: Former Research scientist (life sciences)  . Smokeless tobacco: Never Used     Comment: Stopped smoking 29 years ago   . Alcohol use No     Allergies   Bee venom; Shellfish allergy; and Codeine   Review of Systems Review of Systems  Constitutional: Negative for chills and fever.  Respiratory: Negative for shortness of breath.   Cardiovascular: Negative for chest pain.  Gastrointestinal: Negative for abdominal pain, nausea and vomiting.  Musculoskeletal: Positive for arthralgias and myalgias. Negative for back pain and neck pain.  Skin: Negative for rash and wound.  Neurological: Positive for numbness. Negative for dizziness, weakness, light-headedness and headaches.  All other systems reviewed and are negative.    Physical Exam Updated Vital Signs BP 156/83 (BP Location: Right Arm)   Pulse (!) 57   Temp 97.7 F (36.5 C) (Oral)   Resp 18   Ht 5\' 7"  (1.702 m)   Wt 204 lb (92.5 kg)   SpO2 98%   BMI 31.95 kg/m  Physical Exam  Constitutional: He is oriented to person, place, and time. He appears well-developed and well-nourished. No distress.  HENT:  Head: Normocephalic and atraumatic.  Mouth/Throat: Oropharynx is clear and moist.  Eyes: EOM are normal. Pupils are equal, round, and reactive to light.  Neck: Normal range of motion. Neck supple.  No posterior midline cervical tenderness to palpation.  Cardiovascular: Normal rate and regular rhythm.   Pulmonary/Chest: Effort normal and breath sounds normal.  Abdominal: Soft. Bowel sounds are normal. There is no tenderness. There is no rebound and no guarding.  Musculoskeletal: Normal range of motion. He exhibits tenderness. He exhibits no edema or deformity.    Neurological: He is alert and oriented to person, place, and time.  Skin: Skin is warm and dry. No rash noted. No erythema.  Psychiatric: He has a normal mood and affect. His behavior is normal.  Nursing note and vitals reviewed.    ED Treatments / Results  Labs (all labs ordered are listed, but only abnormal results are displayed) Labs Reviewed - No data to display  EKG  EKG Interpretation None       Radiology Dg Shoulder Left  Result Date: 08/17/2016 CLINICAL DATA:  Initial evaluation for diffuse left shoulder pain status post injury. EXAM: LEFT SHOULDER - 2+ VIEW COMPARISON:  None available. FINDINGS: No acute fracture or dislocation. Humeral head in normal alignment with the glenoid. AC joint approximated. Calcific density at the posterosuperior aspect of the left humeral head suggestive underlying calcific tendinopathy/rotator cuff disease. Mild flattening of the humeral head in this region suggestive of remote anterior/ inferior dislocation. No corresponding Bankart lesion at the inferior glenoid. Visualized left hemithorax is clear. IMPRESSION: 1. No acute osseous abnormality about the left shoulder. 2. Periarticular calcification at the posterior/superior aspect of the humeral head, suggesting underlying calcific tendinopathy/rotator cuff disease. Electronically Signed   By: Jeannine Boga M.D.   On: 08/17/2016 19:41    Procedures Procedures (including critical care time)  Medications Ordered in ED Medications  ketorolac (TORADOL) injection 60 mg (60 mg Intramuscular Given 08/17/16 2036)     Initial Impression / Assessment and Plan / ED Course  I have reviewed the triage vital signs and the nursing notes.  Pertinent labs & imaging results that were available during my care of the patient were reviewed by me and considered in my medical decision making (see chart for details).  Clinical Course    Placed in sling to wear for comfort. Advised to follow-up with  sports medicine/orthopedist. Return precautions given.   Final Clinical Impressions(s) / ED Diagnoses   Final diagnoses:  Calcific tendinitis of left shoulder    New Prescriptions Discharge Medication List as of 08/17/2016  8:13 PM       Julianne Rice, MD 08/19/16 [phone]

## 2016-09-21 DIAGNOSIS — M7532 Calcific tendinitis of left shoulder: Secondary | ICD-10-CM | POA: Diagnosis not present

## 2016-09-21 DIAGNOSIS — G912 (Idiopathic) normal pressure hydrocephalus: Secondary | ICD-10-CM | POA: Diagnosis not present

## 2016-09-21 DIAGNOSIS — M25512 Pain in left shoulder: Secondary | ICD-10-CM | POA: Diagnosis not present

## 2016-09-27 ENCOUNTER — Ambulatory Visit: Payer: Non-veteran care | Admitting: Neurology

## 2016-10-17 ENCOUNTER — Other Ambulatory Visit: Payer: Self-pay | Admitting: Neurology

## 2016-10-17 ENCOUNTER — Ambulatory Visit (INDEPENDENT_AMBULATORY_CARE_PROVIDER_SITE_OTHER): Payer: No Typology Code available for payment source | Admitting: Neurology

## 2016-10-17 ENCOUNTER — Telehealth: Payer: Self-pay | Admitting: Neurology

## 2016-10-17 ENCOUNTER — Encounter: Payer: Self-pay | Admitting: Neurology

## 2016-10-17 VITALS — BP 144/88 | HR 88 | Resp 20 | Ht 67.0 in | Wt 199.0 lb

## 2016-10-17 DIAGNOSIS — R2689 Other abnormalities of gait and mobility: Secondary | ICD-10-CM

## 2016-10-17 DIAGNOSIS — G912 (Idiopathic) normal pressure hydrocephalus: Secondary | ICD-10-CM | POA: Diagnosis not present

## 2016-10-17 DIAGNOSIS — N3941 Urge incontinence: Secondary | ICD-10-CM

## 2016-10-17 DIAGNOSIS — G3184 Mild cognitive impairment, so stated: Secondary | ICD-10-CM

## 2016-10-17 DIAGNOSIS — R296 Repeated falls: Secondary | ICD-10-CM | POA: Diagnosis not present

## 2016-10-17 MED ORDER — ALPRAZOLAM 1 MG PO TABS: 1.0000 mg | ORAL_TABLET | ORAL | 0 refills | Status: DC | PRN

## 2016-10-17 NOTE — Telephone Encounter (Signed)
LP order needs to changed . Order number [id number] comments Pre and post . When reordered  I will call [phone] to reschedule.

## 2016-10-17 NOTE — Telephone Encounter (Signed)
Lumbar Puncture

## 2016-10-17 NOTE — Addendum Note (Signed)
Addended by: Larey Seat on: 10/17/2016 10:51 AM   Modules accepted: Orders

## 2016-10-17 NOTE — Patient Instructions (Signed)
Albert Hahn will undergo an MRI of the brain to verify the disproportionate dilation of ventricle structures in comparison to cortical atrophy. If this is confirmed he will have neuro rehabilitation physical therapists see him before he has a spinal tap then has a spinal tap and within 3 hours after the spinal tap a second evaluation. This evaluation will be gait, coordination and cognition based.  The MRI will be scheduled for the next 14 days, physical therapy can be done at any time after that but the second physical therapy evaluation is to be done after spinal tap within in the same day.
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
## 2016-10-18 ENCOUNTER — Telehealth: Payer: Self-pay | Admitting: Neurology

## 2016-10-18 NOTE — Telephone Encounter (Signed)
Called and spoke to Patient's wife .  Lumbar puncture PT before and after.   scheduled at [facility] 10-28-2016 . NPO four hours before. Arrive at Short stay admitting at 9:00 am . Scheduling telephone number [phone].  Patient's wife Understood all details.

## 2016-10-18 NOTE — Telephone Encounter (Signed)
Noted it does per wife.

## 2016-10-18 NOTE — Telephone Encounter (Signed)
Great - they had travel plans , hope this fits their schedule.

## 2016-10-27 ENCOUNTER — Other Ambulatory Visit: Payer: Self-pay | Admitting: Radiology

## 2016-10-28 ENCOUNTER — Other Ambulatory Visit: Payer: Self-pay | Admitting: *Deleted

## 2016-10-28 ENCOUNTER — Other Ambulatory Visit: Payer: Non-veteran care

## 2016-10-28 ENCOUNTER — Telehealth: Payer: Self-pay | Admitting: Neurology

## 2016-10-28 ENCOUNTER — Ambulatory Visit (HOSPITAL_COMMUNITY): Payer: Non-veteran care

## 2016-10-28 NOTE — Telephone Encounter (Signed)
Spoke to [employer] in PT at [facility].  She will use what it already placed in the EPIC.  Evaluate and treat PT (for NPH eval Pre and Post PT relating to LP).  I relayed to Dr. Brett Fairy (that when getting LP done at hospital order for LP is placed (radiology)  as well as separate PT order (in therapy dept).

## 2016-10-28 NOTE — Telephone Encounter (Signed)
I spoke to wendy and she has what she needs.  (see other note) as well.

## 2016-10-28 NOTE — Telephone Encounter (Signed)
Please review PT order, Thank YOU , CD

## 2016-10-28 NOTE — Telephone Encounter (Signed)
This is a NPH evaluation and has been ordered as such. Pre Test PT can take place a week prior to LP , only the post test is time correlated within an hour of LP.   Whom do I need to talk to,? Telephone number , please.  This order was placed over 10 days ago. CD

## 2016-10-28 NOTE — Telephone Encounter (Signed)
Albert Hahn in PT is needing orders for pre/post physical therapy patient is having LP on Monday 10/31/16.

## 2016-10-31 ENCOUNTER — Ambulatory Visit: Payer: Non-veteran care | Admitting: Neurology

## 2016-10-31 ENCOUNTER — Ambulatory Visit (HOSPITAL_COMMUNITY)
Admission: RE | Admit: 2016-10-31 | Discharge: 2016-10-31 | Disposition: A | Discharge status: Home / Self Care | Payer: Medicare Other | Source: Ambulatory Visit | Attending: Neurology | Admitting: Neurology

## 2016-10-31 ENCOUNTER — Encounter (HOSPITAL_COMMUNITY): Payer: Self-pay

## 2016-10-31 DIAGNOSIS — G9389 Other specified disorders of brain: Secondary | ICD-10-CM | POA: Diagnosis not present

## 2016-10-31 DIAGNOSIS — N3941 Urge incontinence: Secondary | ICD-10-CM

## 2016-10-31 DIAGNOSIS — R296 Repeated falls: Secondary | ICD-10-CM | POA: Insufficient documentation

## 2016-10-31 DIAGNOSIS — G3184 Mild cognitive impairment, so stated: Secondary | ICD-10-CM | POA: Diagnosis not present

## 2016-10-31 DIAGNOSIS — G912 (Idiopathic) normal pressure hydrocephalus: Secondary | ICD-10-CM

## 2016-10-31 DIAGNOSIS — R2689 Other abnormalities of gait and mobility: Secondary | ICD-10-CM | POA: Diagnosis not present

## 2016-10-31 LAB — CSF CELL COUNT WITH DIFFERENTIAL
RBC COUNT CSF: 0 /mm3
Tube #: 3
WBC CSF: 1 /mm3 (ref 0–5)

## 2016-10-31 LAB — CREATININE, SERUM
Creatinine, Ser: 1.23 mg/dL (ref 0.61–1.24)
GFR calc Af Amer: >60 mL/min (ref >60)
GFR calc non Af Amer: 58 mL/min — ABNORMAL LOW (ref >60)

## 2016-10-31 LAB — PROTEIN, CSF: Total  Protein, CSF: 28 mg/dL (ref 15–45)

## 2016-10-31 LAB — GLUCOSE, CSF: Glucose, CSF: 58 mg/dL (ref 40–70)

## 2016-10-31 MED ORDER — ACETAMINOPHEN 500 MG PO TABS
1000.0000 mg | ORAL_TABLET | Freq: Four times a day (QID) | ORAL | Status: DC | PRN
  Filled 2016-10-31: qty 2

## 2016-10-31 MED ORDER — LIDOCAINE HCL (PF) 1 % IJ SOLN: 5.0000 mL | Freq: Once | INTRAMUSCULAR | Status: DC

## 2016-10-31 MED ORDER — GADOBENATE DIMEGLUMINE 529 MG/ML IV SOLN
20.0000 mL | Freq: Once | INTRAVENOUS | Status: AC
  Administered 2016-10-31: 19 mL via INTRAVENOUS

## 2016-10-31 NOTE — Telephone Encounter (Signed)
Noted  

## 2016-10-31 NOTE — Discharge Instructions (Signed)
Lumbar Puncture, Care After °Refer to this sheet in the next few weeks. These instructions provide you with information on caring for yourself after your procedure. Your health care provider may also give you more specific instructions. Your treatment has been planned according to current medical practices, but problems sometimes occur. Call your health care provider if you have any problems or questions after your procedure. °What can I expect after the procedure? °After your procedure, it is typical to have the following sensations: °· Mild discomfort or pain at the insertion site. °· Mild headache that is relieved with pain medicines. ° °Follow these instructions at home: ° °· Avoid lifting anything heavier than 10 lb (4.5 kg) for at least 12 hours after the procedure. °· Drink enough fluids to keep your urine clear or pale yellow. °Contact a health care provider if: °· You have fever or chills. °· You have nausea or vomiting. °· You have a headache that lasts for more than 2 days. °Get help right away if: °· You have any numbness or tingling in your legs. °· You are unable to control your bowel or bladder. °· You have bleeding or swelling in your back at the insertion site. °· You are dizzy or faint. °This information is not intended to replace advice given to you by your health care provider. Make sure you discuss any questions you have with your health care provider. °Document Released: 08/06/2013 Document Revised: 01/07/2016 Document Reviewed: 04/09/2013 °Elsevier Interactive Patient Education © 2017 [employer] Inc[employer] ° °

## 2016-11-02 ENCOUNTER — Telehealth: Payer: Self-pay | Admitting: *Deleted

## 2016-11-02 ENCOUNTER — Ambulatory Visit: Payer: Self-pay | Admitting: Neurology

## 2016-11-02 LAB — OLIGOCLONAL BANDS, CSF + SERM

## 2016-11-02 NOTE — Telephone Encounter (Signed)
I called pt. I advised him that I spoke to Dr. Brett Fairy and she recommends a follow up appt to discuss his results further. Pt is agreeable to an appt on 11/03/2016 at 9:00am. Pt verbalized understanding of appt date and time.  Of note, I cannot find the PT pre and post LP notes. I will try and find out where to find those.

## 2016-11-02 NOTE — Telephone Encounter (Signed)
Found documented in [facility] arrived test order.  (under flowsheets).

## 2016-11-02 NOTE — Telephone Encounter (Signed)
Lovey Newcomer, can you find the PT person that performed the evaluation for NPH? If the report is not yet written it may be best to get their verbal conclusion about the test result. CD

## 2016-11-02 NOTE — Telephone Encounter (Signed)
Per Dr Brett Fairy, spoke with patient's wife, Albert Hahn,  on [place] and informed her that Albert Hahn' glomerular filtration rate is reduced, and his creatinine is elevated. Advised her that he needs to hydrate well before any imaging studies are obtained. 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  Informed her that the CSF labs showed normal cells and normal protein and glucose. Advised her the  CSF chemistry is normal.  Patient's wife then began to ask many questions regarding his diagnosis, further treatments, ways to prevent "this from happening again", if he needed a shunt. She also asked for his MRI brain results. This RN advised her quetions are out of this RN's scope of knowledge. Advised her this RN would route her questions, concerns to Dr Dohnmeier's RN and Dr Brett Fairy for a call back and possible follow up appointment. Patient's wife verbalized understanding, appreciation.

## 2016-11-03 ENCOUNTER — Telehealth: Payer: Self-pay

## 2016-11-03 ENCOUNTER — Encounter: Payer: Self-pay | Admitting: Neurology

## 2016-11-03 ENCOUNTER — Ambulatory Visit (INDEPENDENT_AMBULATORY_CARE_PROVIDER_SITE_OTHER): Payer: No Typology Code available for payment source | Admitting: Neurology

## 2016-11-03 VITALS — BP 129/75 | HR 79 | Resp 20 | Ht 67.0 in | Wt 202.0 lb

## 2016-11-03 DIAGNOSIS — G912 (Idiopathic) normal pressure hydrocephalus: Secondary | ICD-10-CM

## 2016-11-03 DIAGNOSIS — F4310 Post-traumatic stress disorder, unspecified: Secondary | ICD-10-CM | POA: Diagnosis not present

## 2016-11-03 DIAGNOSIS — Z8572 Personal history of non-Hodgkin lymphomas: Secondary | ICD-10-CM

## 2016-11-03 NOTE — Telephone Encounter (Signed)
I caught the patient in the hall way and reported my conversation with PT and referral to neuro surgery. CD

## 2016-11-03 NOTE — Addendum Note (Signed)
Addended by: Larey Seat on: 11/03/2016 10:17 AM   Modules accepted: Orders

## 2016-11-03 NOTE — Progress Notes (Addendum)
[facility]   Provider:  Larey Seat, M D  Referring Provider: Asencion Noble, MD Primary Care Physician:  Asencion Noble, MD  Chief Complaint  Patient presents with  . Follow-up    LP/MRI results  Chief complaint according to patient :  " I don't know why I am here. "   HPI:  Landin Tallon Wagman is a 71 y.o. male ,    I have the pleasure of seeing [name] today in the presence of his spouse upon referral of his primary care physician, not the [place]. Referral by Dr. 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 I've also several progress notes available that the [place] had kept on the patient. Stating that an MRI reviewed at the neurosurgery services at the [facility] indicates no atrophy in the setting of enlarged ventricles, evaluation by neurology did not state that the patient has a classic triad of NPH. The patient reports that about 3 or 4 years ago he underwent a spinal tap after which he felt physically much better, his gait had stabilized, he also felt cognitively more clear. Since his spinal tap several years ago, he has developed a Parkinsonian gait shuffling, frequent falls when walking on an uneven ground, and he feels very forgetful at times disoriented, he also states that he has urinary urge incontinence control issues with the bladder. 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 He is a change his undergarments about 3 times a day because of this incontinence issue. He brought me a disc DVD with his radiology images from the [facility] in [place], but I cannot longer review these on my computer. I will have to forward them to my radiology colleagues. His last MRI was performed in January 2017 and his gait disorder has progressively gotten worse since. His wife states that he falls almost daily sometimes he blacks out seems not to know what happened to him. Based on her report of the progressive nature and parkinsonian  features of his gait disorder and the incontinence, I will order an MRI to be performed here with and without contrast. A physical therapy evaluation pre-spinal tap, and spinal tap and a physical therapy evaluation within 3 hours after. If this shows a significant improvement he would be a shunt candidate. Neurosurgical procedures can be performed at the [facility] or more locally -as the patient prefers. He has his PCP, oncologist and neurology now locally.  [facility] than 14 months [facility] and his condition has worsened since I think it is time for a new MRI and if this is confirming the suspicion for NPH, I will send him directly for a new spinal tap and I will have physical therapy look at him before and after.  Social history: married, 2 adult children- daughters , 2 granddaughters, twin grand sons. Tree farmer. Horses , garden for vegetables.  Father was bipolar- and dangerous.  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  Quit  Drinking  15 years ago Used to smoke.  Quit 17 years ago, after emphysema diagnosis.   No caffeine. - causes anxiety Nocturia - BPH.  Skin cancer  -Mohs surgery left with  nasal deviated septum.   Reported he has  a diagnosis of NPH ? Has had parkinsonian gait, falls, and memory trouble.    22nd of March 2018. [name] is seen here today in the presence of his wife after several tests have been performed. An  MRI of the brain was obtained on March 19 which showed bilateral ventricular enlargement including the third ventricle , no edema. 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 Mild to moderate for age white matter signal changes.  Mr.  Tomaro then underwent a physical therapy evaluation at 8 AM on March 19 which was followed by a spinal tap. Opening pressure of12 cm water, 28 cc were removed, Followed by a second physical therapy evaluation at 1:30 PM. I have Axis II a very rudimentary report to the computer system that scored the patient at 18 points 3 and 19 points post LP. However the patient and his family were told by the physical therapist that he had a significant improvement following his spinal tap, he was able to rise without bracing himself, his gait was stable his stance was stable. He did not have urinary control issues, his speech was more fluent and he actually was able to retrieve words faster according to him and his family. I am trying to find a way to get in contact with the physical therapist so that I can have her first hand impression directed to me. The patient's CSF tested normal for glucose, total protein, and cells. They were for oligoclonal bands observed, these can occur in patients with normal pressure hydrocephalus as well as in patients with pseudotumor cerebri. 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 TBI,   Review of Systems: Out of a complete 14 system review, the patient complains of only the following symptoms, and all other reviewed systems are negative.   Mr. Orrego and his wife report that he is no longer swaying, his gait is stable and well balanced, he appears less rigid more limber. His word fluency has improved and he feels that he has an easier time retrieving memory. His headaches have also not occurred in the last 3 days. The question is if all this is related to the obtainment of 28 mL of CSF.  Social History   Social History  . Marital status: Married    Spouse name: N/A  . Number of children: N/A  . Years of education: N/A   Occupational History  . Retired     Social History Main Topics  .  Smoking status: Former Research scientist (life sciences)  . Smokeless tobacco: Never Used     Comment: Stopped smoking 29 years ago   . Alcohol use No  . Drug use: No  . Sexual activity: Not on file   Other Topics Concern  . Not on file   Social History Narrative   Denies caffeine use     No family history on file.  Past Medical History:  Diagnosis Date  . Anxiety   . Depression   . H/O emphysema   . Headache(784.0)   . Hypertension   . 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 BACK SURGERY    . HERNIA REPAIR    . HIP SURGERY    . JOINT REPLACEMENT    . LUMBAR PUNCTURE  07/23/2012   severe headaches  . TOTAL HIP ARTHROPLASTY      Current  Outpatient Prescriptions  Medication Sig Dispense Refill  . acetaminophen (TYLENOL) 500 MG tablet Take 500-1,000 mg by mouth every 6 (six) hours as needed for mild pain or moderate pain.    Marland Kitchen ALPRAZolam (XANAX) 1 MG tablet Take 1 tablet (1 mg total) by mouth as needed for anxiety. 30 tablet 0  . buPROPion (WELLBUTRIN SR) 100 MG 12 hr tablet Take 100 mg by mouth 2 (two) times daily.    Marland Kitchen docusate sodium (COLACE) 100 MG capsule Take 100 mg by mouth daily as needed for mild constipation.    . hydrochlorothiazide (MICROZIDE) 12.5 MG capsule Take 12.5 mg by mouth daily.    Marland Kitchen oxyCODONE-acetaminophen (PERCOCET/ROXICET) 5-325 MG per tablet Take 1-2 tablets by mouth every 6 (six) hours as needed.     . temazepam (RESTORIL) 15 MG capsule Take 15 mg by mouth at bedtime as needed. sleep    . terazosin (HYTRIN) 1 MG capsule Take 1 mg by mouth 2 (two) times daily.      No current facility-administered medications for this visit.     Allergies as of 11/03/2016 - Review Complete 11/03/2016  Allergen Reaction Noted  . Bee venom Anaphylaxis 11/06/2012  . Shellfish allergy Anaphylaxis 07/22/2012  . Codeine Nausea And Vomiting 12/28/2010    Vitals: BP 129/75   Pulse 79   Resp 20   Ht 5\' 7"  (1.702 m)   Wt 202 lb  (91.6 kg)   BMI 31.64 kg/m  Last Weight:  Wt Readings from Last 1 Encounters:  11/03/16 202 lb (91.6 kg)   IDP:OEUM mass index is 31.64 kg/m.    Ht Readings from Last 1 Encounters:  11/03/16 5\' 7"  (1.702 m)   Geriatric Depression score 12 out of 15 .   Physical exam: Cooperative, talkative , friendly . Reports being irritable.  General: The patient is awake, alert and appears not in acute distress. The patient is well groomed. Head: Normocephalic, atraumatic. Neck is supple. Mallampati 2-3 , neck circumference: 17.0 . Nasal airflow congested, Cardiovascular:  Regular rate and rhythm, without  murmurs or carotid bruit, and without distended neck veins. Respiratory: Lung on the right wheezing. Skin:  Without evidence of edema, or rash, Septal nasal deviation Trunk: BMI is 32 . The patient's posture is stooped  Neurologic exam :The patient is awake and alert, oriented to place and time.  Memory subjective described as impaired ' I lost my vocabulary "   Speech is fluent,  withDysarthria, dysphonia and  aphasia. Mood and affect are appropriate. Cranial nerves: Pupils are equal and briskly reactive to light. Funduscopic exam without evidence of pallor or edema. Extraocular movements  in vertical and horizontal planes intact and without nystagmus. Visual fields by finger perimetry are intact.Hearing to finger rub intact.  Facial sensation intact to fine touch. Facial motor strength is symmetric and tongue and uvula move midline. Shoulder shrug was symmetrical.   Motor exam: Normal tone, muscle bulk and symmetric strength in all extremities. He does provide good grip strength in his left hand but not so strong and his right. He is right-hand dominant.  Sensory:  Fine touch, pinprick and vibration were tested in all extremities.- normal- no peripheral sensory neuropathy . Coordination: Rapid alternating movements in the fingers/hands were normal.  Finger-to-nose maneuver  normal without evidence of  ataxia, dysmetria or tremor. Gait and station: Patient walks without assistive device and is able unassisted to climb up to the exam table. The patient is able to walk on a straight line, he  has never been able to perform a tandem gait before. He is not shuffling, he has normal arm swing he turned with 3-4 steps without swaying, but turns are not fragmented . Reports no dizziness, lightheadedness or propulsion.  Deep tendon reflexes: in the  upper and lower extremities are symmetric and intact. Babinski deferred .  The patient was advised of the nature of the diagnosed sleep disorder , the treatment options and risks for general a health and wellness arising from not treating the condition.  I spent more than  35  minutes of face to face time with the patient. Greater than 50% of time was spent in counseling and coordination of care. We have discussed the diagnosis and differential and I answered the patient's questions.     Assessment:  After physical and neurologic examination, review of laboratory studies,  Personal review of imaging studies, reports of other /same  Imaging studies ,  Results of polysomnography/ neurophysiology testing and pre-existing records as far as provided in visit., my assessment is   1) NPH - verbal report related by Patient and his wife was that of major improvement after high volume CSF- Tap.  Unfortunately the inpatient Epic record does not give me Axis II a verbal report only to a score list which I cannot interpret. I will send the patient for a shunt evaluation with neurosurgery after I have received the physical therapists impression in a signed report.  Addendum from 11/03/2016 I have the opportunity to speak to the physical therapist that performs testing with [name] last Monday. Her impression was a drastic improvement post spinal tap high volume draw. She noted that his tandem standing was not possible and had been impossible prior, his gait was more fluent,  he appeared brighter more alert, he remained stable with his vitals. Coordination testing has improved for heel-to-shin and finger-to-nose testing. Gait speed was significantly improved. He needed 50% less time for the same distance. He was not rigid not shuffling. Normal arm swing. Based on these results I will send the patient for a VP shunt procedure to neurosurgery.   Asencion Partridge Tyreke Kaeser MD  11/03/2016   CC: Asencion Noble, [address] [place], [place] [address]

## 2016-11-03 NOTE — Telephone Encounter (Signed)
Pt also caught me in the hall and asked that Dr. Brett Fairy call him to discuss the conversation with the physical therapist.

## 2016-11-03 NOTE — Telephone Encounter (Signed)
Dr. Brett Fairy has questions regarding this pt's physical therapy pre and post LP. I called and left a message for Wells Guiles, PT to call Dr. Brett Fairy, but I will also reach out this way, so they can discuss this pt's care.

## 2016-11-04 NOTE — Telephone Encounter (Signed)
Left VM ,CSF is replaced within 24-48 hours , high volume tap may take 3 days. CD

## 2016-11-04 NOTE — Telephone Encounter (Signed)
Pt wife called back and I relayed the response from Dr Brett Fairy, she was grateful for the response.

## 2016-11-04 NOTE — Telephone Encounter (Signed)
Pt wife would forgot to ask how long will it take the fluid to build back up that was drained because of the hydrocephalus

## 2016-11-08 DIAGNOSIS — R030 Elevated blood-pressure reading, without diagnosis of hypertension: Secondary | ICD-10-CM | POA: Diagnosis not present

## 2016-11-08 DIAGNOSIS — G919 Hydrocephalus, unspecified: Secondary | ICD-10-CM | POA: Diagnosis not present

## 2016-11-10 ENCOUNTER — Other Ambulatory Visit: Payer: Self-pay | Admitting: Neurological Surgery

## 2016-11-18 ENCOUNTER — Telehealth: Payer: Self-pay | Admitting: *Deleted

## 2016-11-21 NOTE — Telephone Encounter (Signed)
I spoke to wife and let her know that I have faxed this in

## 2016-11-21 NOTE — Telephone Encounter (Signed)
Dr Spero Curb called needing clarification on tests ordered for the pt. RN skyped, she said Dr D will call him back around 12:30 today. His phone number is [phone]

## 2016-11-21 NOTE — Telephone Encounter (Signed)
Wife called in stating that additional records need to be sent to Dr. Adelene Amas (Fax [phone]). She states that Dr. Adelene Amas is working on getting the patient's shunt surgery approved through the [place]. I printed off the notes and results that we have on file and have faxed in now.  Once done wife requested that we let her know [phone]

## 2016-11-21 NOTE — Telephone Encounter (Signed)
Is it ok to write letter requested below?

## 2016-11-21 NOTE — Telephone Encounter (Signed)
Dr Chelsea Aus needed information about the upcoming neurosurgery- and the [facility] will decide based on it's own availability and cost schedule if the procedure will be done elsewhere and if the costs are taken over by [place]. The procedure is a VP shunt for treatment of hydrocephalus.  I referred him to Neurosurgery to discuss. CD

## 2016-11-22 ENCOUNTER — Ambulatory Visit (HOSPITAL_COMMUNITY)
Admission: RE | Admit: 2016-11-22 | Discharge: 2016-11-22 | Disposition: A | Discharge status: Home / Self Care | Payer: Medicare Other | Source: Ambulatory Visit | Attending: Neurological Surgery | Admitting: Neurological Surgery

## 2016-11-22 ENCOUNTER — Encounter (HOSPITAL_COMMUNITY)
Admission: RE | Admit: 2016-11-22 | Discharge: 2016-11-22 | Disposition: A | Discharge status: Home / Self Care | Payer: Medicare Other | Source: Ambulatory Visit | Attending: Neurological Surgery | Admitting: Neurological Surgery

## 2016-11-22 ENCOUNTER — Encounter (HOSPITAL_COMMUNITY): Payer: Self-pay

## 2016-11-22 DIAGNOSIS — I10 Essential (primary) hypertension: Secondary | ICD-10-CM | POA: Diagnosis not present

## 2016-11-22 DIAGNOSIS — G919 Hydrocephalus, unspecified: Secondary | ICD-10-CM

## 2016-11-22 DIAGNOSIS — Z9221 Personal history of antineoplastic chemotherapy: Secondary | ICD-10-CM | POA: Diagnosis not present

## 2016-11-22 DIAGNOSIS — Z8572 Personal history of non-Hodgkin lymphomas: Secondary | ICD-10-CM | POA: Diagnosis not present

## 2016-11-22 DIAGNOSIS — G912 (Idiopathic) normal pressure hydrocephalus: Secondary | ICD-10-CM | POA: Diagnosis not present

## 2016-11-22 DIAGNOSIS — Z87891 Personal history of nicotine dependence: Secondary | ICD-10-CM | POA: Diagnosis not present

## 2016-11-22 DIAGNOSIS — Z96649 Presence of unspecified artificial hip joint: Secondary | ICD-10-CM | POA: Diagnosis not present

## 2016-11-22 DIAGNOSIS — G8194 Hemiplegia, unspecified affecting left nondominant side: Secondary | ICD-10-CM | POA: Diagnosis not present

## 2016-11-22 HISTORY — DX: Post-traumatic stress disorder, unspecified: F43.10

## 2016-11-22 HISTORY — DX: Personal history of other diseases of the digestive system: Z87.19

## 2016-11-22 HISTORY — DX: Unspecified osteoarthritis, unspecified site: M19.90

## 2016-11-22 LAB — BASIC METABOLIC PANEL
ANION GAP: 9 (ref 5–15)
BUN: 29 mg/dL — ABNORMAL HIGH (ref 6–20)
CHLORIDE: 107 mmol/L (ref 101–111)
CO2: 24 mmol/L (ref 22–32)
Calcium: 9.1 mg/dL (ref 8.9–10.3)
Creatinine, Ser: 1.44 mg/dL — ABNORMAL HIGH (ref 0.61–1.24)
GFR calc Af Amer: 56 mL/min — ABNORMAL LOW (ref >60)
GFR, EST NON AFRICAN AMERICAN: 48 mL/min — AB (ref >60)
GLUCOSE: 111 mg/dL — AB (ref 65–99)
POTASSIUM: 3.6 mmol/L (ref 3.5–5.1)
Sodium: 140 mmol/L (ref 135–145)

## 2016-11-22 LAB — CBC WITH DIFFERENTIAL/PLATELET
Basophils Absolute: 0 10*3/uL (ref 0.0–0.1)
Basophils Relative: 0 %
Eosinophils Absolute: 0.4 10*3/uL (ref 0.0–0.7)
Eosinophils Relative: 8 %
HEMATOCRIT: 42.3 % (ref 39.0–52.0)
Hemoglobin: 14.7 g/dL (ref 13.0–17.0)
LYMPHS ABS: 1.4 10*3/uL (ref 0.7–4.0)
LYMPHS PCT: 26 %
MCH: 33.3 pg (ref 26.0–34.0)
MCHC: 34.8 g/dL (ref 30.0–36.0)
MCV: 95.7 fL (ref 78.0–100.0)
MONO ABS: 0.6 10*3/uL (ref 0.1–1.0)
Monocytes Relative: 10 %
NEUTROS ABS: 3 10*3/uL (ref 1.7–7.7)
Neutrophils Relative %: 56 %
Platelets: 170 10*3/uL (ref 150–400)
RBC: 4.42 MIL/uL (ref 4.22–5.81)
RDW: 12.7 % (ref 11.5–15.5)
WBC: 5.4 10*3/uL (ref 4.0–10.5)

## 2016-11-22 LAB — PROTIME-INR
INR: 1
Prothrombin Time: 13.2 seconds (ref 11.4–15.2)

## 2016-11-22 MED ORDER — CHLORHEXIDINE GLUCONATE CLOTH 2 % EX PADS: 6.0000 | MEDICATED_PAD | Freq: Once | CUTANEOUS | Status: DC

## 2016-11-22 NOTE — Pre-Procedure Instructions (Signed)
Albert Hahn  11/22/2016      [facility] - [place], [place]. Albert Hahn Albert Hahn [address] Phone: [phone] Fax: [phone]    Your procedure is scheduled on Friday April 13.  Report to [facility] Admitting at 1:00 P.M.  Call this number if you have problems the morning of surgery:  [phone]   Remember:  Do not eat food or drink liquids after midnight.  Take these medicines the morning of surgery with A SIP OF WATER: bupropion (wellbutrin), oxycodone if needed, acetaminophen (tylenol) if needed, diazepam (Valium) if needed, eye drops  Take all other medications as prescribed except 7 days prior to surgery STOP taking any Aspirin, Aleve, Naproxen, Ibuprofen, Motrin, Advil, Goody's, BC's, all herbal medications, fish oil, and all vitamins    Do not wear jewelry, make-up or nail polish.  Do not wear lotions, powders, or perfumes, or deoderant.  Do not shave 48 hours prior to surgery.  Men may shave face and neck.  Do not bring valuables to the hospital.  [facility] is not responsible for any belongings or valuables.  Contacts, dentures or bridgework may not be worn into surgery.  Leave your suitcase in the car.  After surgery it may be brought to your room.  For patients admitted to the hospital, discharge time will be determined by your treatment team.  Patients discharged the day of surgery will not be allowed to drive home.    Special instructions:    Albert Hahn- Preparing For Surgery  Before surgery, you can play an important role. Because skin is not sterile, your skin needs to be as free of germs as possible. You can reduce the number of germs on your skin by washing with CHG (chlorahexidine gluconate) Soap before surgery.  CHG is an antiseptic cleaner which kills germs and bonds with the skin to continue killing germs even after washing.  Please do not use if you  have an allergy to CHG or antibacterial soaps. If your skin becomes reddened/irritated stop using the CHG.  Do not shave (including legs and underarms) for at least 48 hours prior to first CHG shower. It is OK to shave your face.  Please follow these instructions carefully.   1. Shower the NIGHT BEFORE SURGERY and the MORNING OF SURGERY with CHG.   2. If you chose to wash your hair, wash your hair first as usual with your normal shampoo.  3. After you shampoo, rinse your hair and body thoroughly to remove the shampoo.  4. Use CHG as you would any other liquid soap. You can apply CHG directly to the skin and wash gently with a scrungie or a clean washcloth.   5. Apply the CHG Soap to your body ONLY FROM THE NECK DOWN.  Do not use on open wounds or open sores. Avoid contact with your eyes, ears, mouth and genitals (private parts). Wash genitals (private parts) with your normal soap.  6. Wash thoroughly, paying special attention to the area where your surgery will be performed.  7. Thoroughly rinse your body with warm water from the neck down.  8. DO NOT shower/wash with your normal soap after using and rinsing off the CHG Soap.  9. Pat yourself dry with a CLEAN TOWEL.   10. Wear CLEAN PAJAMAS   11. Place CLEAN SHEETS on your bed the night of your first  shower and DO NOT SLEEP WITH PETS.    Day of Surgery: Do not apply any deodorants/lotions. Please wear clean clothes to the hospital/surgery center.      Please read over the following fact sheets that you were given. Coughing and Deep Breathing

## 2016-11-22 NOTE — Progress Notes (Signed)
PCP - Asencion Noble Cardiologist - pt denies cardiologist and cardiac hx  Chest x-ray - 11/22/16 EKG - requested from [place] and from PCP   Patient denies shortness of breath, fever, cough and chest pain at PAT appointment   Patient verbalized understanding of instructions that was given to them at the PAT appointment. Patient expressed that there were no further questions.  Patient was also instructed that they will need to review over the PAT instructions again at home before the surgery.

## 2016-11-24 MED ORDER — DEXAMETHASONE SODIUM PHOSPHATE 10 MG/ML IJ SOLN
10.0000 mg | INTRAMUSCULAR | Status: DC
  Filled 2016-11-24: qty 1

## 2016-11-24 MED ORDER — CEFAZOLIN SODIUM-DEXTROSE 2-4 GM/100ML-% IV SOLN
2.0000 g | INTRAVENOUS | Status: AC
  Administered 2016-11-25: 2 g via INTRAVENOUS
  Filled 2016-11-24: qty 100

## 2016-11-25 ENCOUNTER — Inpatient Hospital Stay (HOSPITAL_COMMUNITY): Payer: Medicare Other | Admitting: Anesthesiology

## 2016-11-25 ENCOUNTER — Encounter (HOSPITAL_COMMUNITY): Payer: Self-pay | Admitting: Certified Registered Nurse Anesthetist

## 2016-11-25 ENCOUNTER — Encounter (HOSPITAL_COMMUNITY)
Admission: RE | Disposition: A | Discharge status: Home Health | Payer: Self-pay | Source: Ambulatory Visit | Attending: Neurological Surgery

## 2016-11-25 ENCOUNTER — Inpatient Hospital Stay (HOSPITAL_COMMUNITY): Payer: Medicare Other | Admitting: Vascular Surgery

## 2016-11-25 ENCOUNTER — Inpatient Hospital Stay (HOSPITAL_COMMUNITY)
Admission: RE | Admit: 2016-11-25 | Discharge: 2016-11-28 | DRG: 032 | Disposition: A | Discharge status: Home Health | Payer: Medicare Other | Source: Ambulatory Visit | Attending: Neurological Surgery | Admitting: Neurological Surgery

## 2016-11-25 DIAGNOSIS — F4310 Post-traumatic stress disorder, unspecified: Secondary | ICD-10-CM | POA: Diagnosis not present

## 2016-11-25 DIAGNOSIS — Z8572 Personal history of non-Hodgkin lymphomas: Secondary | ICD-10-CM | POA: Diagnosis not present

## 2016-11-25 DIAGNOSIS — G8194 Hemiplegia, unspecified affecting left nondominant side: Secondary | ICD-10-CM | POA: Diagnosis present

## 2016-11-25 DIAGNOSIS — I10 Essential (primary) hypertension: Secondary | ICD-10-CM | POA: Diagnosis present

## 2016-11-25 DIAGNOSIS — Z9221 Personal history of antineoplastic chemotherapy: Secondary | ICD-10-CM

## 2016-11-25 DIAGNOSIS — Z96649 Presence of unspecified artificial hip joint: Secondary | ICD-10-CM | POA: Diagnosis present

## 2016-11-25 DIAGNOSIS — Z87891 Personal history of nicotine dependence: Secondary | ICD-10-CM

## 2016-11-25 DIAGNOSIS — G912 (Idiopathic) normal pressure hydrocephalus: Secondary | ICD-10-CM | POA: Diagnosis not present

## 2016-11-25 DIAGNOSIS — G919 Hydrocephalus, unspecified: Secondary | ICD-10-CM | POA: Diagnosis not present

## 2016-11-25 DIAGNOSIS — R930 Abnormal findings on diagnostic imaging of skull and head, not elsewhere classified: Secondary | ICD-10-CM | POA: Diagnosis not present

## 2016-11-25 DIAGNOSIS — G8190 Hemiplegia, unspecified affecting unspecified side: Secondary | ICD-10-CM

## 2016-11-25 DIAGNOSIS — Z982 Presence of cerebrospinal fluid drainage device: Secondary | ICD-10-CM

## 2016-11-25 HISTORY — DX: Adverse effect of unspecified anesthetic, initial encounter: T41.45XA

## 2016-11-25 HISTORY — PX: VENTRICULOPERITONEAL SHUNT: SHX204

## 2016-11-25 HISTORY — DX: Other complications of anesthesia, initial encounter: T88.59XA

## 2016-11-25 LAB — SURGICAL PCR SCREEN
MRSA, PCR: NEGATIVE
Staphylococcus aureus: NEGATIVE

## 2016-11-25 SURGERY — SHUNT INSERTION VENTRICULAR-PERITONEAL
Anesthesia: General | Site: Head

## 2016-11-25 MED ORDER — PROPOFOL 10 MG/ML IV BOLUS
INTRAVENOUS | Status: AC
  Filled 2016-11-25: qty 20

## 2016-11-25 MED ORDER — HEMOSTATIC AGENTS (NO CHARGE) OPTIME
TOPICAL | Status: DC | PRN
  Administered 2016-11-25: 1 via TOPICAL

## 2016-11-25 MED ORDER — DOCUSATE SODIUM 100 MG PO CAPS
500.0000 mg | ORAL_CAPSULE | Freq: Every day | ORAL | Status: DC
  Administered 2016-11-25 – 2016-11-26 (×2): 500 mg via ORAL
  Filled 2016-11-25 (×2): qty 5

## 2016-11-25 MED ORDER — PANTOPRAZOLE SODIUM 40 MG IV SOLR
40.0000 mg | Freq: Every day | INTRAVENOUS | Status: DC
  Filled 2016-11-25: qty 40

## 2016-11-25 MED ORDER — ONDANSETRON HCL 4 MG/2ML IJ SOLN
INTRAMUSCULAR | Status: AC
  Filled 2016-11-25: qty 2

## 2016-11-25 MED ORDER — LACTATED RINGERS IV SOLN
INTRAVENOUS | Status: DC
  Administered 2016-11-25: 16:00:00 via INTRAVENOUS
  Administered 2016-11-25: 50 mL/h via INTRAVENOUS
  Administered 2016-11-25: 15:00:00 via INTRAVENOUS

## 2016-11-25 MED ORDER — ONDANSETRON HCL 4 MG/2ML IJ SOLN: 4.0000 mg | INTRAMUSCULAR | Status: DC | PRN

## 2016-11-25 MED ORDER — ONDANSETRON HCL 4 MG/2ML IJ SOLN
INTRAMUSCULAR | Status: DC | PRN
  Administered 2016-11-25: 4 mg via INTRAVENOUS

## 2016-11-25 MED ORDER — KETOROLAC TROMETHAMINE 30 MG/ML IJ SOLN
30.0000 mg | Freq: Once | INTRAMUSCULAR | Status: DC | PRN
Start: 2016-11-25 — End: 2016-11-25

## 2016-11-25 MED ORDER — FENTANYL CITRATE (PF) 100 MCG/2ML IJ SOLN: 25.0000 ug | INTRAMUSCULAR | Status: DC | PRN

## 2016-11-25 MED ORDER — HYDROCHLOROTHIAZIDE 25 MG PO TABS
25.0000 mg | ORAL_TABLET | Freq: Every day | ORAL | Status: DC
  Administered 2016-11-26 – 2016-11-28 (×3): 25 mg via ORAL
  Filled 2016-11-25 (×3): qty 1

## 2016-11-25 MED ORDER — SUGAMMADEX SODIUM 200 MG/2ML IV SOLN
INTRAVENOUS | Status: AC
  Filled 2016-11-25: qty 2

## 2016-11-25 MED ORDER — NALOXONE HCL 0.4 MG/ML IJ SOLN
INTRAMUSCULAR | Status: AC
  Filled 2016-11-25: qty 1

## 2016-11-25 MED ORDER — BACITRACIN ZINC 500 UNIT/GM EX OINT
TOPICAL_OINTMENT | CUTANEOUS | Status: AC
  Filled 2016-11-25: qty 28.35

## 2016-11-25 MED ORDER — ACETAMINOPHEN 500 MG PO TABS: 500.0000 mg | ORAL_TABLET | Freq: Four times a day (QID) | ORAL | Status: DC | PRN

## 2016-11-25 MED ORDER — ONDANSETRON HCL 4 MG PO TABS: 4.0000 mg | ORAL_TABLET | ORAL | Status: DC | PRN

## 2016-11-25 MED ORDER — TERAZOSIN HCL 2 MG PO CAPS
4.0000 mg | ORAL_CAPSULE | Freq: Every day | ORAL | Status: DC
  Administered 2016-11-25 – 2016-11-27 (×3): 4 mg via ORAL
  Filled 2016-11-25 (×3): qty 2

## 2016-11-25 MED ORDER — LABETALOL HCL 5 MG/ML IV SOLN: 10.0000 mg | INTRAVENOUS | Status: DC | PRN

## 2016-11-25 MED ORDER — LIDOCAINE HCL (CARDIAC) 20 MG/ML IV SOLN
INTRAVENOUS | Status: DC | PRN
  Administered 2016-11-25: 60 mg via INTRAVENOUS

## 2016-11-25 MED ORDER — THROMBIN 5000 UNITS EX SOLR
CUTANEOUS | Status: AC
  Filled 2016-11-25: qty 5000

## 2016-11-25 MED ORDER — POLYVINYL ALCOHOL 1.4 % OP SOLN
1.0000 [drp] | Freq: Every day | OPHTHALMIC | Status: DC
  Administered 2016-11-26 – 2016-11-28 (×3): 1 [drp] via OPHTHALMIC
  Filled 2016-11-25: qty 15

## 2016-11-25 MED ORDER — DEXAMETHASONE SODIUM PHOSPHATE 10 MG/ML IJ SOLN
6.0000 mg | Freq: Four times a day (QID) | INTRAMUSCULAR | Status: AC
  Administered 2016-11-25 – 2016-11-26 (×4): 6 mg via INTRAVENOUS
  Filled 2016-11-25 (×4): qty 1

## 2016-11-25 MED ORDER — CEFAZOLIN IN D5W 1 GM/50ML IV SOLN
1.0000 g | Freq: Three times a day (TID) | INTRAVENOUS | Status: AC
  Administered 2016-11-25 – 2016-11-26 (×2): 1 g via INTRAVENOUS
  Filled 2016-11-25 (×2): qty 50

## 2016-11-25 MED ORDER — DEXAMETHASONE SODIUM PHOSPHATE 4 MG/ML IJ SOLN
4.0000 mg | Freq: Four times a day (QID) | INTRAMUSCULAR | Status: DC
  Administered 2016-11-26 – 2016-11-27 (×2): 4 mg via INTRAVENOUS
  Filled 2016-11-25 (×2): qty 1

## 2016-11-25 MED ORDER — DEXAMETHASONE SODIUM PHOSPHATE 4 MG/ML IJ SOLN: 4.0000 mg | Freq: Three times a day (TID) | INTRAMUSCULAR | Status: DC

## 2016-11-25 MED ORDER — LIDOCAINE-EPINEPHRINE 1 %-1:100000 IJ SOLN
INTRAMUSCULAR | Status: DC | PRN
  Administered 2016-11-25: 4 mL

## 2016-11-25 MED ORDER — SUGAMMADEX SODIUM 200 MG/2ML IV SOLN
INTRAVENOUS | Status: DC | PRN
  Administered 2016-11-25: 200 mg via INTRAVENOUS

## 2016-11-25 MED ORDER — LIDOCAINE-EPINEPHRINE 1 %-1:100000 IJ SOLN
INTRAMUSCULAR | Status: AC
  Filled 2016-11-25: qty 1

## 2016-11-25 MED ORDER — OXYCODONE HCL 5 MG PO TABS
5.0000 mg | ORAL_TABLET | Freq: Four times a day (QID) | ORAL | Status: DC | PRN
  Administered 2016-11-25 – 2016-11-26 (×2): 5 mg via ORAL
  Filled 2016-11-25 (×2): qty 1

## 2016-11-25 MED ORDER — BUPROPION HCL ER (SR) 150 MG PO TB12
150.0000 mg | ORAL_TABLET | Freq: Two times a day (BID) | ORAL | Status: DC
  Administered 2016-11-25 – 2016-11-28 (×6): 150 mg via ORAL
  Filled 2016-11-25 (×7): qty 1

## 2016-11-25 MED ORDER — THROMBIN 5000 UNITS EX SOLR
CUTANEOUS | Status: DC | PRN
  Administered 2016-11-25 (×2): 5000 [IU] via TOPICAL

## 2016-11-25 MED ORDER — MUPIROCIN 2 % EX OINT
1.0000 "application " | TOPICAL_OINTMENT | Freq: Once | CUTANEOUS | Status: AC
  Administered 2016-11-25: 1 via TOPICAL
  Filled 2016-11-25: qty 22

## 2016-11-25 MED ORDER — SODIUM CHLORIDE 0.9 % IR SOLN
Status: DC | PRN
  Administered 2016-11-25: 500 mL

## 2016-11-25 MED ORDER — FENTANYL CITRATE (PF) 100 MCG/2ML IJ SOLN
INTRAMUSCULAR | Status: DC | PRN
  Administered 2016-11-25: 50 ug via INTRAVENOUS
  Administered 2016-11-25: 100 ug via INTRAVENOUS
  Administered 2016-11-25 (×2): 50 ug via INTRAVENOUS

## 2016-11-25 MED ORDER — ROCURONIUM BROMIDE 100 MG/10ML IV SOLN
INTRAVENOUS | Status: DC | PRN
  Administered 2016-11-25: 50 mg via INTRAVENOUS
  Administered 2016-11-25: 30 mg via INTRAVENOUS

## 2016-11-25 MED ORDER — PROPOFOL 10 MG/ML IV BOLUS
INTRAVENOUS | Status: DC | PRN
  Administered 2016-11-25: 200 mg via INTRAVENOUS

## 2016-11-25 MED ORDER — PHENYLEPHRINE HCL 10 MG/ML IJ SOLN
INTRAMUSCULAR | Status: DC | PRN
  Administered 2016-11-25: 20 ug/min via INTRAVENOUS

## 2016-11-25 MED ORDER — PROMETHAZINE HCL 25 MG/ML IJ SOLN: 6.2500 mg | INTRAMUSCULAR | Status: DC | PRN

## 2016-11-25 MED ORDER — POTASSIUM CHLORIDE IN NACL 20-0.9 MEQ/L-% IV SOLN
INTRAVENOUS | Status: DC
  Administered 2016-11-25: 21:00:00 via INTRAVENOUS
  Filled 2016-11-25 (×2): qty 1000

## 2016-11-25 MED ORDER — ROCURONIUM BROMIDE 50 MG/5ML IV SOSY
PREFILLED_SYRINGE | INTRAVENOUS | Status: AC
  Filled 2016-11-25: qty 5

## 2016-11-25 MED ORDER — FENTANYL CITRATE (PF) 250 MCG/5ML IJ SOLN
INTRAMUSCULAR | Status: AC
  Filled 2016-11-25: qty 5

## 2016-11-25 MED ORDER — 0.9 % SODIUM CHLORIDE (POUR BTL) OPTIME
TOPICAL | Status: DC | PRN
  Administered 2016-11-25: 1000 mL

## 2016-11-25 MED ORDER — DEXAMETHASONE SODIUM PHOSPHATE 4 MG/ML IJ SOLN
INTRAMUSCULAR | Status: DC | PRN
  Administered 2016-11-25: 10 mg via INTRAVENOUS

## 2016-11-25 MED ORDER — OXYCODONE HCL 5 MG PO TABS
ORAL_TABLET | ORAL | Status: AC
Start: 2016-11-25 — End: 2016-11-26
  Filled 2016-11-25: qty 1

## 2016-11-25 MED ORDER — THROMBIN 5000 UNITS EX SOLR
CUTANEOUS | Status: AC
  Filled 2016-11-25: qty 10000

## 2016-11-25 MED ORDER — MORPHINE SULFATE (PF) 2 MG/ML IV SOLN
1.0000 mg | INTRAVENOUS | Status: DC | PRN
  Administered 2016-11-25 (×2): 2 mg via INTRAVENOUS
  Filled 2016-11-25 (×3): qty 1

## 2016-11-25 MED ORDER — POLYETHYL GLYCOL-PROPYL GLYCOL 0.4-0.3 % OP SOLN: 1.0000 [drp] | Freq: Every day | OPHTHALMIC | Status: DC

## 2016-11-25 MED ORDER — BACITRACIN ZINC 500 UNIT/GM EX OINT
TOPICAL_OINTMENT | CUTANEOUS | Status: DC | PRN
  Administered 2016-11-25: 1 via TOPICAL

## 2016-11-25 MED ORDER — PROMETHAZINE HCL 25 MG PO TABS: 12.5000 mg | ORAL_TABLET | ORAL | Status: DC | PRN

## 2016-11-25 SURGICAL SUPPLY — 65 items
BAG DECANTER FOR FLEXI CONT (MISCELLANEOUS) ×3 IMPLANT
BENZOIN TINCTURE PRP APPL 2/3 (GAUZE/BANDAGES/DRESSINGS) ×12 IMPLANT
BLADE CLIPPER SPEC (BLADE) ×3 IMPLANT
BUR ACORN 6.0 PRECISION (BURR) ×2 IMPLANT
BUR ACORN 6.0MM PRECISION (BURR) ×1
CANISTER SUCT 3000ML PPV (MISCELLANEOUS) ×3 IMPLANT
CARTRIDGE OIL MAESTRO DRILL (MISCELLANEOUS) ×1 IMPLANT
CLIP RANEY DISP (INSTRUMENTS) ×3 IMPLANT
CLOSURE WOUND 1/2 X4 (GAUZE/BANDAGES/DRESSINGS) ×1
DERMABOND ADVANCED (GAUZE/BANDAGES/DRESSINGS) ×2
DERMABOND ADVANCED .7 DNX12 (GAUZE/BANDAGES/DRESSINGS) ×1 IMPLANT
DIFFUSER DRILL AIR PNEUMATIC (MISCELLANEOUS) ×3 IMPLANT
DRAPE INCISE IOBAN 66X45 STRL (DRAPES) ×3 IMPLANT
DRAPE ORTHO SPLIT 77X108 STRL (DRAPES) ×4
DRAPE POUCH INSTRU U-SHP 10X18 (DRAPES) ×3 IMPLANT
DRAPE SURG 17X23 STRL (DRAPES) IMPLANT
DRAPE SURG ORHT 6 SPLT 77X108 (DRAPES) ×2 IMPLANT
ELECT REM PT RETURN 9FT ADLT (ELECTROSURGICAL) ×3
ELECTRODE REM PT RTRN 9FT ADLT (ELECTROSURGICAL) ×1 IMPLANT
GAUZE SPONGE 4X4 12PLY STRL LF (GAUZE/BANDAGES/DRESSINGS) ×3 IMPLANT
GAUZE SPONGE 4X4 16PLY XRAY LF (GAUZE/BANDAGES/DRESSINGS) ×3 IMPLANT
GLOVE BIO SURGEON STRL SZ7 (GLOVE) IMPLANT
GLOVE BIO SURGEON STRL SZ8 (GLOVE) ×3 IMPLANT
GLOVE BIOGEL PI IND STRL 7.0 (GLOVE) IMPLANT
GLOVE BIOGEL PI INDICATOR 7.0 (GLOVE)
GLOVE ECLIPSE 9.0 STRL (GLOVE) ×3 IMPLANT
GLOVE SURG SS PI 7.5 STRL IVOR (GLOVE) ×9 IMPLANT
GOWN STRL REUS W/ TWL LRG LVL3 (GOWN DISPOSABLE) ×1 IMPLANT
GOWN STRL REUS W/ TWL XL LVL3 (GOWN DISPOSABLE) ×2 IMPLANT
GOWN STRL REUS W/TWL 2XL LVL3 (GOWN DISPOSABLE) ×3 IMPLANT
GOWN STRL REUS W/TWL LRG LVL3 (GOWN DISPOSABLE) ×2
GOWN STRL REUS W/TWL XL LVL3 (GOWN DISPOSABLE) ×4
KIT BASIN OR (CUSTOM PROCEDURE TRAY) ×3 IMPLANT
KIT ROOM TURNOVER OR (KITS) ×3 IMPLANT
NEEDLE HYPO 25X1 1.5 SAFETY (NEEDLE) ×3 IMPLANT
NS IRRIG 1000ML POUR BTL (IV SOLUTION) ×3 IMPLANT
OIL CARTRIDGE MAESTRO DRILL (MISCELLANEOUS) ×3
PACK LAMINECTOMY NEURO (CUSTOM PROCEDURE TRAY) ×3 IMPLANT
PAD ARMBOARD 7.5X6 YLW CONV (MISCELLANEOUS) ×9 IMPLANT
PATTIES SURGICAL .5 X.5 (GAUZE/BANDAGES/DRESSINGS) IMPLANT
PATTIES SURGICAL .5 X3 (DISPOSABLE) IMPLANT
PATTIES SURGICAL .75X.75 (GAUZE/BANDAGES/DRESSINGS) IMPLANT
RUBBERBAND STERILE (MISCELLANEOUS) IMPLANT
SHEATH PERITONEAL INTRO 46 (MISCELLANEOUS) IMPLANT
SHEATH PERITONEAL INTRO 61 (MISCELLANEOUS) ×3 IMPLANT
SPONGE LAP 4X18 X RAY DECT (DISPOSABLE) IMPLANT
SPONGE SURGIFOAM ABS GEL SZ50 (HEMOSTASIS) ×3 IMPLANT
STAPLER VISISTAT 35W (STAPLE) ×3 IMPLANT
STRIP CLOSURE SKIN 1/2X4 (GAUZE/BANDAGES/DRESSINGS) ×2 IMPLANT
SUT ETHILON 3 0 FSL (SUTURE) IMPLANT
SUT SILK 0 TIES 10X30 (SUTURE) IMPLANT
SUT SILK 2 0 TIES 17X18 (SUTURE) ×2
SUT SILK 2-0 18XBRD TIE BLK (SUTURE) ×1 IMPLANT
SUT SILK 3 0 SH 30 (SUTURE) IMPLANT
SUT VIC AB 2-0 CP2 18 (SUTURE) ×6 IMPLANT
SUT VIC AB 3-0 SH 8-18 (SUTURE) ×3 IMPLANT
SYR 5ML LL (SYRINGE) IMPLANT
SYR CONTROL 10ML LL (SYRINGE) ×6 IMPLANT
TAPE CLOTH SURG 4X10 WHT LF (GAUZE/BANDAGES/DRESSINGS) ×3 IMPLANT
TOWEL GREEN STERILE (TOWEL DISPOSABLE) ×3 IMPLANT
TOWEL GREEN STERILE FF (TOWEL DISPOSABLE) ×2 IMPLANT
TRAY FOLEY W/METER SILVER 16FR (SET/KITS/TRAYS/PACK) IMPLANT
UNDERPAD 30X30 (UNDERPADS AND DIAPERS) IMPLANT
VALVE RT ANGLE UNITIZE DIST (Valve) ×3 IMPLANT
WATER STERILE IRR 1000ML POUR (IV SOLUTION) ×3 IMPLANT

## 2016-11-25 NOTE — Op Note (Signed)
11/25/2016  4:39 PM  PATIENT:  Albert Hahn  71 y.o. male  PRE-OPERATIVE DIAGNOSIS:  Normal pressure hydrocephalus  POST-OPERATIVE DIAGNOSIS:  same  PROCEDURE:  Ventriculoperitoneal shunt placed with a H[place]im programmable valve set at a pressure of 140 mm of water  SURGEON:  Sherley Bounds, MD  ASSISTANTS: Dr. Annette Stable  ANESTHESIA:   General  EBL: Less than 25 ml  Total I/O In: 1000 [I.V.:1000] Out: -   BLOOD ADMINISTERED: none  DRAINS: None  SPECIMEN:  none  INDICATION FOR PROCEDURE: This patient presented with ataxia and mental disturbance and urinary incontinence. Imaging showed large ventricles. The patient tried conservative measures without relief. Symptoms were debilitating. He did well after lumbar puncture. Recommended ventriculoperitoneal shunt.. Patient understood the risks, benefits, and alternatives and potential outcomes and wished to proceed.  PROCEDURE DETAILS: The patient was taken to the operating room and after induction of adequate generalized endotracheal anesthesia he was placed in the supine position on the operating room table. His head was turned left and his posterior parietal region was shaved. He was then cleaned and prepped from the head to the right abdomen and then draped in the usual sterile fashion. 5 mL of local anesthesia was injected and a hockey-stick incision was made behind the right ear in the superior posterior parietal region. An incision was made in the right upper quadrant and I dissected down to the rectus sheath which was opened. The underlying musculature was split and the inferior rectus sheath was opened to identify the underlying peritoneum. Peritoneum was then opened and we could see the peristalsis of the intestines and the omentum. We then passed a shunt passer from the abdominal incision to the cranial incision, tied a silk suture to the end of the distal catheter and pulled the distal catheter through the shunt passer. We created a  pocket for the valve. We then drilled a hole in the skull with the high-speed drill. We opened the dura. We passed the ventricular catheter to a depth of about 9 cm with good flow of clear CSF starting at about 4 cm. We attached this to the [place]ham reservoir and tied it into position with a silk suture. We then had good flow of CSF through the distal catheter. We then passed the distal catheter within the peritoneum. Cranial incision was closed with 2-0 Vicryl in the galea and staples in the skin. The peritoneum was closed and the rectus sheath was closed with 2-0 Vicryl. I then closed the anterior rectus sheath with 2-0 Vicryl. I then closed the subcutaneous tissues with 2-0 Vicryl and the subcuticular tissue with 3-0 Vicryl and the skin with Dermabond. The drapes were removed. Dressings were applied. The patient was then awakened from general anesthesia and transported to the recovery room in stable condition. At the end of the procedure all sponge needle and instruments counts were correct.  PLAN OF CARE: Admit to inpatient   PATIENT DISPOSITION:  PACU - hemodynamically stable.   Delay start of Pharmacological VTE agent (>24hrs) due to surgical blood loss or risk of bleeding:  yes

## 2016-11-25 NOTE — H&P (Signed)
Subjective: Patient is a 71 y.o. 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 Onset of symptoms was several months ago, gradually worsening since that time. He has the typical triad od sxs. Did well with LP. Marland Kitchen The symptoms have been progressive. Symptoms are exacerbated by exercise. MRI or CT showed HCP   Past Medical History:  Diagnosis Date  . Anxiety   . Arthritis   . Complication of anesthesia    woke up during lymph node removal, facial surgery  . Depression   . H/O emphysema   . Headache(784.0)   . History of hiatal hernia    "doesnt bother me"  . Hypertension   . 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 PTSD (post-traumatic stress disorder)     Past Surgical History:  Procedure Laterality Date  . BACK SURGERY     L3-L4 Fusion per pt  . COLONOSCOPY W/ POLYPECTOMY    . HERNIA REPAIR    . HIP SURGERY    . JOINT REPLACEMENT    . LUMBAR PUNCTURE  07/23/2012   severe headaches  . NECK SURGERY     metal plates and screws in neck   . TOTAL HIP ARTHROPLASTY      Prior to Admission medications   Medication Sig Start Date End Date Taking? Authorizing Provider  buPROPion (WELLBUTRIN SR) 150 MG 12 hr tablet Take 150 mg by mouth 2 (two) times daily.   Yes Historical Provider, MD  diazepam (VALIUM) 5 MG tablet Take 5 mg by mouth every 8 (eight) hours as needed for anxiety.   Yes Historical Provider, MD  docusate sodium (COLACE) 100 MG capsule Take 500 mg by mouth daily.    Yes Historical Provider, MD  hydrochlorothiazide (HYDRODIURIL) 25 MG tablet Take 25 mg by mouth daily.   Yes Historical Provider, MD  oxyCODONE (OXY IR/ROXICODONE) 5 MG immediate release tablet Take 5 mg by mouth every 6 (six) hours as needed (for pain.).   Yes Historical Provider, MD  temazepam (RESTORIL) 15 MG capsule Take 15 mg by mouth at bedtime. sleep   Yes Historical Provider, MD  terazosin (HYTRIN) 2 MG capsule Take 4 mg by mouth at bedtime.   Yes Historical Provider, MD  acetaminophen (TYLENOL) 500 MG tablet Take 500-1,000  mg by mouth every 6 (six) hours as needed for mild pain or moderate pain.    Historical Provider, MD  ALPRAZolam Duanne Moron) 1 MG tablet Take 1 tablet (1 mg total) by mouth as needed for anxiety. Patient not taking: Reported on 11/16/2016 10/17/16   Larey Seat, MD  diphenhydrAMINE (BENADRYL) 25 mg capsule Take 25 mg by mouth 2 (two) times daily as needed (for allergies/sleep.).     Historical Provider, MD  Polyethyl Glycol-Propyl Glycol (LUBRICANT EYE DROPS) 0.4-0.3 % SOLN Place 1-2 drops into both eyes daily.    Historical Provider, MD   Allergies  Allergen Reactions  . Bee Venom Anaphylaxis  . Shellfish Allergy Anaphylaxis  . Codeine Nausea And Vomiting    Social History  Substance Use Topics  . Smoking status: Former Research scientist (life sciences)  . Smokeless tobacco: Never Used     Comment: Stopped smoking 29 years ago   . Alcohol use No    History reviewed. No pertinent family history.   Review of Systems  Positive ROS: neg  All other systems have been reviewed and were otherwise negative with the exception of those mentioned in the HPI and as above.  Objective: Vital signs in last 24 hours: Temp:  [99 F (  37.2 C)] 99 F (37.2 C) (04/13 1324) Pulse Rate:  [67] 67 (04/13 1324) Resp:  [20] 20 (04/13 1324) BP: (163)/(81) 163/81 (04/13 1324) SpO2:  [96 %] 96 % (04/13 1324)  General Appearance: Alert, cooperative, no distress, appears stated age Head: Normocephalic, without obvious abnormality, atraumatic Eyes: PERRL, conjunctiva/corneas clear, EOM's intact    Neck: Supple, symmetrical, trachea midline Back: Symmetric, no curvature, ROM normal, no CVA tenderness Lungs:  respirations unlabored Heart: Regular rate and rhythm Abdomen: Soft, non-tender Extremities: Extremities normal, atraumatic, no cyanosis or edema Pulses: 2+ and symmetric all extremities Skin: Skin color, texture, turgor normal, no rashes or lesions  NEUROLOGIC:   Mental status: Alert and oriented x4,  no aphasia, good  attention span, fund of knowledge, and memory Motor Exam - grossly normal Sensory Exam - grossly normal Reflexes: 1+ Coordination - grossly normal Gait - grossly normal Balance - grossly normal Cranial Nerves: I: smell Not tested  II: visual acuity  OS: nl    OD: nl  II: visual fields Full to confrontation  II: pupils Equal, round, reactive to light  III,VII: ptosis None  III,IV,VI: extraocular muscles  Full ROM  V: mastication Normal  V: facial light touch sensation  Normal  V,VII: corneal reflex  Present  VII: facial muscle function - upper  Normal  VII: facial muscle function - lower Normal  VIII: hearing Not tested  IX: soft palate elevation  Normal  IX,X: gag reflex Present  XI: trapezius strength  5/5  XI: sternocleidomastoid strength 5/5  XI: neck flexion strength  5/5  XII: tongue strength  Normal    Data Review Lab Results  Component Value Date   WBC 5.4 11/22/2016   HGB 14.7 11/22/2016   HCT 42.3 11/22/2016   MCV 95.7 11/22/2016   PLT 170 11/22/2016   Lab Results  Component Value Date   NA 140 11/22/2016   K 3.6 11/22/2016   CL 107 11/22/2016   CO2 24 11/22/2016   BUN 29 (H) 11/22/2016   CREATININE 1.44 (H) 11/22/2016   GLUCOSE 111 (H) 11/22/2016   Lab Results  Component Value Date   INR 1.00 11/22/2016    Assessment/Plan: Patient admitted for VPS for NPH. Patient has failed a reasonable attempt at conservative therapy.  I explained the condition and procedure to the patient and answered any questions.  Patient wishes to proceed with procedure as planned. Understands risks/ benefits and typical outcomes of procedure.   Alieah Brinton S 11/25/2016 2:25 PM

## 2016-11-25 NOTE — Progress Notes (Signed)
eLink Physician-Brief Progress Note Patient Name: Albert Hahn DOB: [birth year] MRN: [id number]   Date of Service  11/25/2016  HPI/Events of Note  Hydro, s/p shunt Awake, alert, rr 13, No distress, hr 3 Talking up a storm  eICU Interventions  Neur monitoring      Intervention Category Evaluation Type: New Patient Evaluation  Albert Sirek J. 11/25/2016, 8:17 PM

## 2016-11-25 NOTE — Anesthesia Preprocedure Evaluation (Signed)
Anesthesia Evaluation  Patient identified by MRN, date of birth, ID band Patient awake    Reviewed: Allergy & Precautions, NPO status , Patient's Chart, lab work & pertinent test results  Airway Mallampati: II  TM Distance: >3 FB Neck ROM: Full    Dental no notable dental hx.    Pulmonary neg pulmonary ROS, former smoker,    Pulmonary exam normal breath sounds clear to auscultation       Cardiovascular hypertension, Normal cardiovascular exam Rhythm:Regular Rate:Normal     Neuro/Psych negative neurological ROS  negative psych ROS   GI/Hepatic negative GI ROS, Neg liver ROS,   Endo/Other  negative endocrine ROS  Renal/GU negative Renal ROS  negative genitourinary   Musculoskeletal negative musculoskeletal ROS (+)   Abdominal   Peds negative pediatric ROS (+)  Hematology H/O NHL   Anesthesia Other Findings   Reproductive/Obstetrics negative OB ROS                             Anesthesia Physical Anesthesia Plan  ASA: II  Anesthesia Plan: General   Post-op Pain Management:    Induction: Intravenous  Airway Management Planned: Oral ETT  Additional Equipment:   Intra-op Plan:   Post-operative Plan: Extubation in OR  Informed Consent: I have reviewed the patients History and Physical, chart, labs and discussed the procedure including the risks, benefits and alternatives for the proposed anesthesia with the patient or authorized representative who has indicated his/her understanding and acceptance.   Dental advisory given  Plan Discussed with: CRNA and Surgeon  Anesthesia Plan Comments:         Anesthesia Quick Evaluation

## 2016-11-25 NOTE — Anesthesia Postprocedure Evaluation (Signed)
Anesthesia Post Note  Patient: Albert Hahn  Procedure(s) Performed: Procedure(s) (LRB): SHUNT INSERTION VENTRICULAR-PERITONEAL (N/A)  Patient location during evaluation: PACU Anesthesia Type: General Level of consciousness: awake and alert Pain management: pain level controlled Vital Signs Assessment: post-procedure vital signs reviewed and stable Respiratory status: spontaneous breathing, nonlabored ventilation, respiratory function stable and patient connected to nasal cannula oxygen Cardiovascular status: blood pressure returned to baseline and stable Postop Assessment: no signs of nausea or vomiting Anesthetic complications: no       Last Vitals:  Vitals:   11/25/16 1656 11/25/16 1709  BP:    Pulse: 70 69  Resp: 11 10  Temp:      Last Pain:  Vitals:   11/25/16 1709  TempSrc:   PainSc: 0-No pain                 Amarilis Belflower S

## 2016-11-25 NOTE — Transfer of Care (Signed)
Immediate Anesthesia Transfer of Care Note  Patient: Albert Hahn  Procedure(s) Performed: Procedure(s): SHUNT INSERTION VENTRICULAR-PERITONEAL (N/A)  Patient Location: PACU  Anesthesia Type:General  Level of Consciousness: awake, alert  and oriented  Airway & Oxygen Therapy: Patient Spontanous Breathing and Patient connected to nasal cannula oxygen  Post-op Assessment: Report given to RN and Post -op Vital signs reviewed and stable  Post vital signs: Reviewed and stable  Last Vitals:  Vitals:   11/25/16 1324 11/25/16 1653  BP: (!) 163/81   Pulse: 67   Resp: 20   Temp: 37.2 C 36.6 C    Last Pain:  Vitals:   11/25/16 1653  TempSrc:   PainSc: 0-No pain      Patients Stated Pain Goal: 3 (91/66/06 0045)  Complications: No apparent anesthesia complications

## 2016-11-25 NOTE — Anesthesia Procedure Notes (Signed)
Procedure Name: Intubation Date/Time: 11/25/2016 3:07 PM Performed by: Oletta Lamas Pre-anesthesia Checklist: Patient identified, Emergency Drugs available, Suction available and Patient being monitored Patient Re-evaluated:Patient Re-evaluated prior to inductionOxygen Delivery Method: Circle System Utilized Preoxygenation: Pre-oxygenation with 100% oxygen Intubation Type: IV induction Ventilation: Mask ventilation without difficulty Laryngoscope Size: Mac and 3 Grade View: Grade I Tube type: Oral Tube size: 7.5 mm Number of attempts: 1 Airway Equipment and Method: Stylet Placement Confirmation: ETT inserted through vocal cords under direct vision,  positive ETCO2 and breath sounds checked- equal and bilateral Secured at: 23 cm Tube secured with: Tape Dental Injury: Teeth and Oropharynx as per pre-operative assessment

## 2016-11-26 ENCOUNTER — Inpatient Hospital Stay (HOSPITAL_COMMUNITY): Payer: Medicare Other

## 2016-11-26 MED ORDER — DOCUSATE SODIUM 100 MG PO CAPS
100.0000 mg | ORAL_CAPSULE | Freq: Two times a day (BID) | ORAL | Status: DC
  Administered 2016-11-26 – 2016-11-28 (×3): 100 mg via ORAL
  Filled 2016-11-26 (×3): qty 1

## 2016-11-26 MED ORDER — DIAZEPAM 5 MG PO TABS
5.0000 mg | ORAL_TABLET | Freq: Three times a day (TID) | ORAL | Status: DC | PRN
  Administered 2016-11-26 – 2016-11-27 (×3): 5 mg via ORAL
  Filled 2016-11-26 (×3): qty 1

## 2016-11-26 MED ORDER — OXYCODONE HCL 5 MG PO TABS
5.0000 mg | ORAL_TABLET | ORAL | Status: DC | PRN
  Administered 2016-11-26 (×2): 5 mg via ORAL
  Filled 2016-11-26: qty 1

## 2016-11-26 MED ORDER — DIPHENHYDRAMINE HCL 25 MG PO CAPS
25.0000 mg | ORAL_CAPSULE | Freq: Three times a day (TID) | ORAL | Status: DC | PRN
  Administered 2016-11-26 – 2016-11-27 (×2): 25 mg via ORAL
  Filled 2016-11-26 (×2): qty 1

## 2016-11-26 MED ORDER — TEMAZEPAM 15 MG PO CAPS
15.0000 mg | ORAL_CAPSULE | Freq: Every evening | ORAL | Status: DC | PRN
  Administered 2016-11-26 – 2016-11-27 (×2): 15 mg via ORAL
  Filled 2016-11-26 (×2): qty 1

## 2016-11-26 NOTE — Progress Notes (Signed)
Patient ID: Albert Hahn, male   DOB: [birth year], 71 y.o.   MRN: [id number] Subjective: Patient reports no headache, some weakness in L shoulder girdle with pain (has cuff injury)  Objective: Vital signs in last 24 hours: Temp:  [97.5 F (36.4 C)-99 F (37.2 C)] 98.3 F (36.8 C) (04/14 0400) Pulse Rate:  [61-89] 79 (04/14 0700) Resp:  [10-21] 18 (04/14 0700) BP: (99-163)/(55-95) 130/95 (04/14 0700) SpO2:  [94 %-100 %] 99 % (04/14 0700) Weight:  [92.4 kg (203 lb 11.3 oz)] 92.4 kg (203 lb 11.3 oz) (04/13 2050)  Intake/Output from previous day: 04/13 0701 - 04/14 0700 In: 2690 [P.O.:460; I.V.:2180; IV Piggyback:50] Out: 405 [Urine:375; Blood:30] Intake/Output this shift: No intake/output data recorded.  Neurologic: Grossly normal, with some weakness in L prox UE good grip, mild L facial droop  Lab Results: Lab Results  Component Value Date   WBC 5.4 11/22/2016   HGB 14.7 11/22/2016   HCT 42.3 11/22/2016   MCV 95.7 11/22/2016   PLT 170 11/22/2016   Lab Results  Component Value Date   INR 1.00 11/22/2016   BMET Lab Results  Component Value Date   NA 140 11/22/2016   K 3.6 11/22/2016   CL 107 11/22/2016   CO2 24 11/22/2016   GLUCOSE 111 (H) 11/22/2016   BUN 29 (H) 11/22/2016   CREATININE 1.44 (H) 11/22/2016   CALCIUM 9.1 11/22/2016    Studies/Results: No results found.  Assessment/Plan: Suspect mild L hemiparesis from shunt catheter passage, but will get CT head to rule out hemorrhage along tract. PT/OT   LOS: 1 day    Kyran Whittier S 11/26/2016, 7:34 AM

## 2016-11-27 MED ORDER — DEXAMETHASONE 4 MG PO TABS
4.0000 mg | ORAL_TABLET | Freq: Four times a day (QID) | ORAL | Status: AC
  Administered 2016-11-27 (×2): 4 mg via ORAL
  Filled 2016-11-27 (×2): qty 1

## 2016-11-27 MED ORDER — BISACODYL 10 MG RE SUPP
10.0000 mg | Freq: Every day | RECTAL | Status: DC | PRN
Start: 2016-11-27 — End: 2016-11-28
  Administered 2016-11-27: 10 mg via RECTAL
  Filled 2016-11-27: qty 1

## 2016-11-27 MED ORDER — DEXAMETHASONE 4 MG PO TABS
4.0000 mg | ORAL_TABLET | Freq: Three times a day (TID) | ORAL | Status: DC
  Administered 2016-11-28 (×2): 4 mg via ORAL
  Filled 2016-11-27 (×2): qty 1

## 2016-11-27 MED ORDER — PANTOPRAZOLE SODIUM 40 MG PO TBEC
40.0000 mg | DELAYED_RELEASE_TABLET | Freq: Every day | ORAL | Status: DC
  Administered 2016-11-27: 40 mg via ORAL
  Filled 2016-11-27: qty 1

## 2016-11-27 NOTE — Progress Notes (Signed)
Overall improved. No headache. Patient states that his unsteadiness and balance issues are much better. He's been able to stand and walk without difficulty. He still having some difficulty with his left shoulder and some proximal weakness in the left upper extremity but hit his motor control is much improved.  Afebrile. Vitals are stable. Awake and alert. Oriented and appropriate. Motor examination limited by some poor range of motion his left shoulder. Motor strength is intact otherwise. Wounds clean and dry.  Follow-up head CT scan yesterday demonstrates suboptimal positioning of his right ventricular catheter however the catheter itself is within the right frontal horn and appears to be functioning well. Ventricles appeared decompressed. No evidence of hemorrhage.  Overall doing better. We'll transfer to floor. I suspect some of his left shoulder issues are secondary to rotator cuff pathology and positioning during surgery. Hopefully this will improve with time area and I do not see any indication for repositioning of the catheter at this point.

## 2016-11-27 NOTE — Evaluation (Signed)
Physical Therapy Evaluation Patient Details Name: Albert Hahn MRN: [id number] DOB: [birth year] Today's Date: 11/27/2016   History of Present Illness  Patient is a 71 y.o. 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 Onset of symptoms was several months ago, gradually worsening since that time. Did well with LP; now s/p VP shunt placement;  has a past medical history of Anxiety; Arthritis; Depression; H/O emphysema; Headache(784.0);  Hypertension; and PTSD (post-traumatic stress disorder).  has a past surgical history that includes Hip surgery; Lumbar puncture (07/23/2012); Hernia repair; Joint replacement; Total hip arthroplasty; Neck surgery; Back surgery  Clinical Impression   Patient is s/p above surgery resulting in functional limitations due to the deficits listed below (see PT Problem List). Very motivated to get up and walk, and he would rather not use the RW; at time of eval, however, pt benefited from using RW for stability and steadiness during ambulation; Noting that he is a bit impulsive; DC home tomorrow is not unreasonable, especially if wife can stay at home with him; Patient will benefit from skilled PT to increase their independence and safety with mobility to allow discharge to the venue listed below.       Follow Up Recommendations Home health PT;Supervision/Assistance - 24 hour;Other (comment) (possibly HHOT as well)    Equipment Recommendations  Rolling walker with 5" wheels;3in1 (PT) (he may already have a RW)    Recommendations for Other Services OT consult     Precautions / Restrictions Precautions Precautions: Fall Restrictions Weight Bearing Restrictions: No      Mobility  Bed Mobility                  Transfers Overall transfer level: Needs assistance Equipment used: Rolling walker (2 wheeled) Transfers: Sit to/from Stand Sit to Stand: Min assist         General transfer comment: Min assist to steady; cues for hand placment; noted tendency to have uncontrolled  descent to sit  Ambulation/Gait Ambulation/Gait assistance: Min guard;+2 safety/equipment Ambulation Distance (Feet): 200 Feet Assistive device: Rolling walker (2 wheeled) Gait Pattern/deviations: Step-through pattern     General Gait Details: Cues for RW proximity and posture; tends to move feet outside the RW when turning  Stairs            Wheelchair Mobility    Modified Rankin (Stroke Patients Only)       Balance Overall balance assessment: Needs assistance           Standing balance-Leahy Scale: Fair                               Pertinent Vitals/Pain Pain Assessment: Faces Faces Pain Scale: Hurts little more Pain Location: Headache pain; soreness in abdomen Pain Descriptors / Indicators: Aching Pain Intervention(s): Monitored during session    Home Living Family/patient expects to be discharged to:: Private residence Living Arrangements: Spouse/significant other Available Help at Discharge: Family;Available 24 hours/day Type of Home: House Home Access: Stairs to enter   [employer] of Steps: 6; 10 (front 6 no rails; back 10 one rail) Home Layout: One level Home Equipment: Walker - 2 wheels      Prior Function Level of Independence: Independent (though with trouble from NPH leading to shunt)               Hand Dominance        Extremity/Trunk Assessment   Upper Extremity Assessment Upper Extremity Assessment: Defer to OT evaluation (pt  with LUE coordination difficulty)    Lower Extremity Assessment Lower Extremity Assessment: Generalized weakness       Communication      Cognition Arousal/Alertness: Awake/alert Behavior During Therapy: WFL for tasks assessed/performed Overall Cognitive Status: Within Functional Limits for tasks assessed                                        General Comments      Exercises Other Exercises Other Exercises: Serial sit<>stands with focus on control X10    Assessment/Plan    PT Assessment Patient needs continued PT services  PT Problem List Decreased strength;Decreased range of motion;Decreased activity tolerance;Decreased balance;Decreased mobility;Decreased coordination;Decreased knowledge of use of DME;Decreased safety awareness;Pain       PT Treatment Interventions DME instruction;Gait training;Stair training;Functional mobility training;Therapeutic activities;Therapeutic exercise;Patient/family education;Balance training;Neuromuscular re-education    PT Goals (Current goals can be found in the Care Plan section)  Acute Rehab PT Goals Patient Stated Goal: walk without RW PT Goal Formulation: With patient Time For Goal Achievement: 12/11/16 Potential to Achieve Goals: Good    Frequency Min 4X/week   Barriers to discharge        Co-evaluation               End of Session Equipment Utilized During Treatment: Gait belt Activity Tolerance: Patient tolerated treatment well Patient left: in chair;with call bell/phone within reach;with family/visitor present Nurse Communication: Mobility status PT Visit Diagnosis: Unsteadiness on feet (R26.81)    Time: 9021-1155 PT Time Calculation (min) (ACUTE ONLY): 23 min   Charges:   PT Evaluation $PT Eval Moderate Complexity: 1 Procedure PT Treatments $Gait Training: 8-22 mins   PT G Codes:        [name], PT  Acute Rehabilitation Services Pager [phone] Office [phone]  [name] 11/27/2016, 10:58 AM

## 2016-11-28 ENCOUNTER — Encounter (HOSPITAL_COMMUNITY): Payer: Self-pay | Admitting: Neurological Surgery

## 2016-11-28 NOTE — Progress Notes (Signed)
Physical Therapy Treatment Patient Details Name: Albert Hahn MRN: [id number] DOB: 05/06/1946 Today's Date: 11/28/2016    History of Present Illness Patient is a 71 y.o. 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 Onset of symptoms was several months ago, gradually worsening since that time. Did well with LP; now s/p VP shunt placement;  has a past medical history of Anxiety; Arthritis; Depression; H/O emphysema; Headache(784.0);  Hypertension; and PTSD (post-traumatic stress disorder).  has a past surgical history that includes Hip surgery; Lumbar puncture (07/23/2012); Hernia repair; Joint replacement; Total hip arthroplasty; Neck surgery; Back surgery    PT Comments    Patient progressing well towards PT goals. Continues to demonstrate impulsivity which seems to be baseline and balance deficits putting pt at increased risk for falls. Tolerated gait training without use of RW as pt refuses to use RW despite education on fall risk. Tolerated stair training with Min guard assist for safety. Education re: safety at home, fall reduction, stair training with wife assist etc. Pt does have RW at home. Eager to return home today. Will follow.   Follow Up Recommendations  Supervision for mobility/OOB;Supervision/Assistance - 24 hour;Home health PT     Equipment Recommendations  3in1 (PT)    Recommendations for Other Services       Precautions / Restrictions Precautions Precautions: Fall Restrictions Weight Bearing Restrictions: No    Mobility  Bed Mobility Overal bed mobility: Modified Independent             General bed mobility comments: Increased time to get to EOB.   Transfers Overall transfer level: Needs assistance Equipment used: None Transfers: Sit to/from Stand Sit to Stand: Min guard         General transfer comment: Min guard for safety. Declined using RW. "I will not ever use that." Transferred to chair post ambulation bout.  Ambulation/Gait Ambulation/Gait assistance: Min  guard Ambulation Distance (Feet): 400 Feet Assistive device: None Gait Pattern/deviations: Step-through pattern;Decreased stride length;Trunk flexed;Wide base of support Gait velocity: fast- cues to decrease   General Gait Details: Fast, unsteady gait with anterior lean and forward momentum with staggering noted. Refused to use RW.    Stairs Stairs: Yes   Stair Management: Alternating pattern;One rail Right Number of Stairs: 5 General stair comments: Cues to slow down and for safety.   Wheelchair Mobility    Modified Rankin (Stroke Patients Only)       Balance Overall balance assessment: Needs assistance Sitting-balance support: Feet supported;No upper extremity supported Sitting balance-Leahy Scale: Fair Sitting balance - Comments: Requires assist with donning shoes/socks.   Standing balance support: During functional activity Standing balance-Leahy Scale: Fair Standing balance comment: Able to stand without UE support. Mild sway noted during dynamic standing.                            Cognition Arousal/Alertness: Awake/alert Behavior During Therapy: Impulsive (baseline) Overall Cognitive Status: Within Functional Limits for tasks assessed                                 General Comments: Pt with questionable safety awareness but think this is more baseline impulsivity vs HOH.      Exercises      General Comments General comments (skin integrity, edema, etc.): Wife present during session.      Pertinent Vitals/Pain Pain Assessment: No/denies pain    Home Living  Prior Function            PT Goals (current goals can now be found in the care plan section) Progress towards PT goals: Progressing toward goals    Frequency    Min 4X/week      PT Plan Current plan remains appropriate    Co-evaluation             End of Session Equipment Utilized During Treatment: Gait belt Activity  Tolerance: Patient tolerated treatment well Patient left: in chair;with call bell/phone within reach;with family/visitor present Nurse Communication: Mobility status PT Visit Diagnosis: Unsteadiness on feet (R26.81)     Time: 8341-9622 PT Time Calculation (min) (ACUTE ONLY): 26 min  Charges:  $Gait Training: 23-37 mins                    G Codes:       Wray Kearns, PT, DPT [place] 11/28/2016, 9:11 AM

## 2016-11-28 NOTE — Progress Notes (Signed)
Patient provided discharge and medication instructions. No questions or concerns voiced by patient or family. Patient discharged home.

## 2016-11-28 NOTE — Discharge Summary (Signed)
Physician Discharge Summary  Patient ID: Albert Hahn MRN: [id number] DOB/AGE: 1946-07-26 71 y.o.  Admit date: 11/25/2016 Discharge date: 11/28/2016  Admission Diagnoses: NPH    Discharge Diagnoses: same   Discharged Condition: good  Hospital Course: The patient was admitted on 11/25/2016 and taken to the operating room where the patient underwent VPS. The patient tolerated the procedure well and was taken to the recovery room and then to the ICU in stable condition. He had some weakness in his left shoulder after the surgery and it is difficult to know if that was neurologic or orthopedic as he has preoperative left shoulder rotator cuff issues. He did improve over a couple of days in the hospital. I did do a postoperative CT scan of the head and found that the path of the catheter is not optimal but is in the ventricle and seems to be functioning. Therefore I think revision carries more risk the potential benefit at this point. The hospital course was otherwise routine. There were no complications. The wound remained clean dry and intact. Pt had appropriate incision soreness.  The patient remained afebrile with stable vital signs, and tolerated a regular diet. The patient continued to increase activities, and pain was well controlled with oral pain medications.   Consults: None  Significant Diagnostic Studies:  Results for orders placed or performed during the hospital encounter of 11/25/16  Surgical pcr screen  Result Value Ref Range   MRSA, PCR NEGATIVE NEGATIVE   Staphylococcus aureus NEGATIVE NEGATIVE    Chest 2 View  Result Date: 11/22/2016 CLINICAL DATA:  Preop for shunt insertion, hydrocephalus EXAM: CHEST  2 VIEW COMPARISON:  08/20/2008 FINDINGS: Cardiomediastinal silhouette is unremarkable. No infiltrate or pleural effusion. No pulmonary edema. Mild degenerative changes mid thoracic spine. Metallic fixation plate noted cervical spine. IMPRESSION: No active cardiopulmonary  disease. Electronically Signed   By: Lahoma Crocker M.D.   On: 11/22/2016 16:03   Ct Head Wo Contrast  Result Date: 11/26/2016 CLINICAL DATA:  71 year old male status post CSF shunt since March 2018, now with decreasing use of hands. EXAM: CT HEAD WITHOUT CONTRAST TECHNIQUE: Contiguous axial images were obtained from the base of the skull through the vertex without intravenous contrast. COMPARISON:  Preoperative brain MRI 10/31/2016 and earlier. FINDINGS: Brain: Right posterolateral approach CSF shunt takes a course obliquely through the right hemisphere and right caudothalamic groove terminating in the subependyma anterior to the right frontal horn (series 21, image 16). The catheter does appear to communicate with the right frontal horn as seen on coronal image 28. Small volume gas in the right sylvian fissure and along the posterior catheter tract. Trace right convexity pneumocephalus. No definite associated acute intracranial hemorrhage. However, there is a second faint hyperdense track diverging from the posterior catheter course directed toward the body of the right lateral ventricle (series 21 images 19 and 20). Stable ventricle size and configuration. Basilar cisterns remain normal. No midline shift. Stable gray-white matter differentiation throughout the brain. No cortically based acute infarct identified. Vascular: No suspicious intracranial vascular hyperdensity. Skull: Right posterior convexity burr hole. No other No acute osseous abnormality identified. Sinuses/Orbits: Visualized paranasal sinuses and mastoids are stable and well pneumatized. Other: Right posterior scalp skin staples in place overlying a CSF reservoir and shunt tubing which continues inferiorly in the posterior right neck. Mild subcutaneous gas and scalp hematoma, but otherwise no adverse scalp soft tissue findings. Visualized orbit soft tissues are within normal limits. IMPRESSION: 1. Right posterolateral approach ventriculostomy  catheter takes  an oblique course toward the right frontal horn including through the right caudothalamic groove. There is a second faint track from the posterior catheter toward the body of the right lateral ventricle. 2. Small volume pneumocephalus but no significant intracranial hemorrhage identified. No intracranial mass effect. 3.  Stable ventricle size and configuration since March. 4. Postoperative changes to the scalp soft tissues about the reservoir and shunt tubing. Electronically Signed   By: Genevie Ann M.D.   On: 11/26/2016 15:02   Mr Jeri Cos RD Contrast  Result Date: 10/31/2016 CLINICAL DATA:  71 year old male with worsening gait disorder. Shuffling gait. Possible normal pressure hydrocephalus. EXAM: MRI HEAD WITHOUT AND WITH CONTRAST TECHNIQUE: Multiplanar, multiecho pulse sequences of the brain and surrounding structures were obtained without and with intravenous contrast. CONTRAST:  14mL MULTIHANCE GADOBENATE DIMEGLUMINE 529 MG/ML IV SOLN COMPARISON:  Face CT 01/18/2013. FINDINGS: Brain: Lateral and third ventricular enlargement, although the temporal horns remain diminutive. The fourth ventricle is normal. No evidence of hyperdynamic flow at the cerebral aqueduct. No transependymal edema suspected. There is anterior frontal lobe volume loss bilaterally (most apparent on sagittal images 11 and 14). Elsewhere cerebral volume appears more normal for age. No restricted diffusion to suggest acute infarction. No midline shift, mass effect, evidence of mass lesion, extra-axial collection or acute intracranial hemorrhage. Cervicomedullary junction and pituitary are within normal limits. Mostly periventricular cerebral white matter T2 and FLAIR hyperintensity, with other scattered central and subcortical white matter signal changes, in a nonspecific configuration. This includes somewhat nodular signal changes in the left periatrial white matter (series 7, image 17). No cortical encephalomalacia. No chronic  cerebral blood products. Deep gray matter nuclei, brainstem, and cerebellum appear normal. No abnormal enhancement identified. No dural thickening. Vascular: There is evidence that the distal left vertebral artery is occluded proximal to the left PICA origin (series 6, image 4), and the left vertebrobasilar junction and PICA probably are supplied in a retrograde fashion from the right. Other Major intracranial vascular flow voids are preserved. Dural venous sinuses are normally enhancing. Skull and upper cervical spine: Negative. Visualized bone marrow signal is within normal limits. Sinuses/Orbits: Normal orbits soft tissues. Trace paranasal sinus mucosal thickening. Other: Visible internal auditory structures appear normal. Mastoids are clear. Negative scalp soft tissues. IMPRESSION: 1. Chronic lateral and third ventriculomegaly, although favor this is related to bilateral anterior frontal lobe volume loss/atrophy rather than NPH. Consider a frontal lobe or frontotemporal neurodegenerative process. 2. 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 3. No acute intracranial abnormality. 4. Mild to moderate for age nonspecific cerebral white matter signal changes. Electronically Signed   By: Genevie Ann M.D.   On: 10/31/2016 11:27   Dg Fluoro Guided Needle Plc Aspiration/injection Loc  Result Date: 10/31/2016 CLINICAL DATA:  Normal pressure hydrocephalus EXAM: DIAGNOSTIC LUMBAR PUNCTURE UNDER FLUOROSCOPIC GUIDANCE FLUOROSCOPY TIME:  Fluoroscopy Time:  0 minutes 30 second Radiation Exposure Index (if provided by the fluoroscopic device): Number of Acquired Spot Images: 0 PROCEDURE: Informed consent was obtained from the patient prior to the procedure, including potential complications of headache, allergy, and pain. With the patient prone, the lower back was prepped with Betadine. 1% Lidocaine was used for local  anesthesia. Lumbar puncture was performed at the L4-5 level using a 20 gauge needle with return of clear CSF with an opening pressure of 12 cm water in the left lateral decubitus position. Twenty-eight ml of CSF  were obtained for laboratory studies. The patient tolerated the procedure well and there were no apparent complications. IMPRESSION: Successful lumbar puncture with fluoroscopic guidance. Normal opening pressure. Electronically Signed   By: Franchot Gallo M.D.   On: 10/31/2016 11:47    Antibiotics:  Anti-infectives    Start     Dose/Rate Route Frequency Ordered Stop   11/26/16 0000  ceFAZolin (ANCEF) IVPB 1 g/50 mL premix     1 g 100 mL/hr over 30 Minutes Intravenous Every 8 hours 11/25/16 2004 11/26/16 0920   11/25/16 1600  bacitracin 50,000 Units in sodium chloride irrigation 0.9 % 500 mL irrigation  Status:  Discontinued       As needed 11/25/16 1602 11/25/16 1645   11/25/16 1430  ceFAZolin (ANCEF) IVPB 2g/100 mL premix     2 g 200 mL/hr over 30 Minutes Intravenous To [facility] 11/24/16 1346 11/25/16 1541      Discharge Exam: Blood pressure 138/85, pulse 60, temperature 97.9 F (36.6 C), temperature source Oral, resp. rate 15, height 5\' 6"  (1.676 m), weight 92.4 kg (203 lb 11.3 oz), SpO2 94 %. Neurologic: Grossly normal Incisions clean dry and intact  Discharge Medications:   Allergies as of 11/28/2016      Reactions   Bee Venom Anaphylaxis   Shellfish Allergy Anaphylaxis   Codeine Nausea And Vomiting      Medication List    TAKE these medications   acetaminophen 500 MG tablet Commonly known as:  TYLENOL Take 500-1,000 mg by mouth every 6 (six) hours as needed for mild pain or moderate pain.   ALPRAZolam 1 MG tablet Commonly known as:  XANAX Take 1 tablet (1 mg total) by mouth as needed for anxiety.   buPROPion 150 MG 12 hr tablet Commonly known as:  WELLBUTRIN SR Take 150 mg by mouth 2 (two) times daily.   diazepam 5 MG tablet Commonly known as:   VALIUM Take 5 mg by mouth every 8 (eight) hours as needed for anxiety.   diphenhydrAMINE 25 mg capsule Commonly known as:  BENADRYL Take 25 mg by mouth 2 (two) times daily as needed (for allergies/sleep.).   docusate sodium 100 MG capsule Commonly known as:  COLACE Take 500 mg by mouth daily.   hydrochlorothiazide 25 MG tablet Commonly known as:  HYDRODIURIL Take 25 mg by mouth daily.   LUBRICANT EYE DROPS 0.4-0.3 % Soln Generic drug:  Polyethyl Glycol-Propyl Glycol Place 1-2 drops into both eyes daily.   oxyCODONE 5 MG immediate release tablet Commonly known as:  Oxy IR/ROXICODONE Take 5 mg by mouth every 6 (six) hours as needed (for pain.).   temazepam 15 MG capsule Commonly known as:  RESTORIL Take 15 mg by mouth at bedtime. sleep   terazosin 2 MG capsule Commonly known as:  HYTRIN Take 4 mg by mouth at bedtime.       Disposition: home   Final Dx: VPS for NPH  Discharge Instructions    Call MD for:  difficulty breathing, headache or visual disturbances    Complete by:  As directed    Call MD for:  persistant nausea and vomiting    Complete by:  As directed    Call MD for:  redness, tenderness, or signs of infection (pain, swelling, redness, odor or green/yellow discharge around incision site)    Complete by:  As directed    Call MD for:  severe uncontrolled pain    Complete by:  As directed    Call MD for:  temperature >100.4    Complete by:  As directed    Diet - low sodium heart healthy    Complete by:  As directed    Increase activity slowly    Complete by:  As directed       Follow-up Information    Sophiarose Eades S, MD. Schedule an appointment as soon as possible for a visit in 2 week(s).   Specialty:  Neurosurgery Contact information: [address] [place] [address] [phone]            Signed: Eustace Moore 11/28/2016, 5:41 AM

## 2016-11-28 NOTE — Evaluation (Signed)
Occupational Therapy Evaluation Patient Details Name: Albert Hahn MRN: [id number] DOB: Jun 07, 1946 Today's Date: 11/28/2016    History of Present Illness Patient is a 71 y.o. 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 Onset of symptoms was several months ago, gradually worsening since that time. Did well with LP; now s/p VP shunt placement;  has a past medical history of Anxiety; Arthritis; Depression; H/O emphysema; Headache(784.0);  Hypertension; and PTSD (post-traumatic stress disorder).  has a past surgical history that includes Hip surgery; Lumbar puncture (07/23/2012); Hernia repair; Joint replacement; Total hip arthroplasty; Neck surgery; Back surgery   Clinical Impression   Pt admitted with above and demonstrates the below listed deficits.  He requires min A - min guard assist for ADLs due to impaired balance and decreased safety awareness.  He has long standing Lt shoulder limitations and new mild deficit with Lt hand coordination.  HE was instructed in initial HEP for Lt UE.  Recommend OPOT.  Pt for discharge home today.  All further OT needs to be addressed at OP.      Follow Up Recommendations  Outpatient OT;Supervision/Assistance - 24 hour    Equipment Recommendations  None recommended by OT    Recommendations for Other Services       Precautions / Restrictions Precautions Precautions: Fall Restrictions Weight Bearing Restrictions: No      Mobility Bed Mobility Overal bed mobility: Modified Independent             General bed mobility comments: in chair   Transfers Overall transfer level: Needs assistance Equipment used: None Transfers: Sit to/from Stand;Stand Pivot Transfers Sit to Stand: Min guard Stand pivot transfers: Min guard       General transfer comment: for safety and balance     Balance Overall balance assessment: Needs assistance Sitting-balance support: Feet supported;No upper extremity supported Sitting balance-Leahy Scale: Good Sitting balance -  Comments: Requires assist with donning shoes/socks.   Standing balance support: No upper extremity supported;During functional activity Standing balance-Leahy Scale: Fair Standing balance comment: Able to stand without UE support. Mild sway noted during dynamic standing.                           ADL either performed or assessed with clinical judgement   ADL                                         General ADL Comments: Pt requires min A to pull pants over feed and min guard assist for balance.  He was instructed to don Lt UE first in shirt due to limited ROM Lt shoulder.  He was instructed to sit to shower.       Vision Baseline Vision/History: No visual deficits Patient Visual Report: No change from baseline       Perception     Praxis      Pertinent Vitals/Pain Pain Assessment: Faces Faces Pain Scale: Hurts little more Pain Location: headache, Lt shoulder with ROM  Pain Descriptors / Indicators: Aching Pain Intervention(s): Monitored during session     Hand Dominance Right   Extremity/Trunk Assessment Upper Extremity Assessment Upper Extremity Assessment: LUE deficits/detail LUE Deficits / Details: Pt reports pain Lt shoulder due to arthritis and "rotator cuff " issues.  He is able to abduct and flex shoulder to 90*.   Pt reports decreased coordination since surgery.  He denies  sensory changes.  Grips strength is equal.  He is able to manipulate items in palm/fingertips    Lower Extremity Assessment Lower Extremity Assessment: Defer to PT evaluation   Cervical / Trunk Assessment Cervical / Trunk Assessment: Kyphotic   Communication Communication Communication: No difficulties   Cognition Arousal/Alertness: Awake/alert Behavior During Therapy: Impulsive Overall Cognitive Status: History of cognitive impairments - at baseline                                 General Comments: Pt very tangential and requires information  repeated.  Wife reports this is better than his prior functioning    General Comments  wife present    Exercises Other Exercises Other Exercises: Pt instructed in isometric scapular adduction and in coordination exercises for Lt UE   Shoulder Instructions      Home Living Family/patient expects to be discharged to:: Private residence Living Arrangements: Spouse/significant other Available Help at Discharge: Family;Available 24 hours/day Type of Home: House Home Access: Stairs to enter [employer] of Steps: 6; 10   Home Layout: One level     Bathroom Shower/Tub: Occupational psychologist: Standard     Home Equipment: Environmental consultant - 2 wheels;Shower seat - built in          Prior Functioning/Environment Level of Independence: Independent        Comments: h/o frequent falls.  Lt shoulder pain with limited ROM, and reports of confusion         OT Problem List: Decreased strength;Decreased range of motion;Impaired balance (sitting and/or standing);Decreased cognition;Decreased safety awareness;Impaired UE functional use;Pain      OT Treatment/Interventions:      OT Goals(Current goals can be found in the care plan section) Acute Rehab OT Goals Patient Stated Goal: go home  OT Goal Formulation: All assessment and education complete, DC therapy  OT Frequency:     Barriers to D/C:            Co-evaluation              End of Session Nurse Communication: Mobility status  Activity Tolerance: Patient tolerated treatment well Patient left: in chair;with call bell/phone within reach;with family/visitor present  OT Visit Diagnosis: Unsteadiness on feet (R26.81)                Time: 9030-0923 OT Time Calculation (min): 36 min Charges:  OT General Charges $OT Visit: 1 Procedure OT Evaluation $OT Eval Moderate Complexity: 1 Procedure OT Treatments $Self Care/Home Management : 8-22 mins G-Codes:     [facility], OTR/L [phone]   Brent,  Narvel Kozub M 11/28/2016, 11:05 AM

## 2016-11-28 NOTE — Care Management Note (Signed)
Case Management Note  Patient Details  Name: Albert Hahn MRN: [id number] Date of Birth: 1945/08/29  Subjective/Objective:   Pt admitted on 11/25/16 s/p VP shunt placement for NPH.  PTA, pt resided at home with spouse.                   Action/Plan: PT/OT recommending HH follow up.  Referral to [facility] for [facility] follow up, per pt/wife choice.  Start of care 24-48h post dc date.    Expected Discharge Date:  11/28/16               Expected Discharge Plan:  [place]  In-House Referral:     Discharge planning Services  CM Consult  Post Acute Care Choice:    Choice offered to:  Spouse, Patient  DME Arranged:    DME Agency:     HH Arranged:  PT, OT HH Agency:  [facility]  Status of Service:  Completed, signed off  If discussed at [place] of Stay Meetings, dates discussed:    Additional Comments:  Reinaldo Raddle, RN, BSN  Trauma/Neuro ICU Case Manager [phone]

## 2016-11-29 DIAGNOSIS — Z982 Presence of cerebrospinal fluid drainage device: Secondary | ICD-10-CM | POA: Diagnosis not present

## 2016-11-29 DIAGNOSIS — I10 Essential (primary) hypertension: Secondary | ICD-10-CM | POA: Diagnosis not present

## 2016-11-29 DIAGNOSIS — G912 (Idiopathic) normal pressure hydrocephalus: Secondary | ICD-10-CM | POA: Diagnosis not present

## 2016-11-29 DIAGNOSIS — Z87891 Personal history of nicotine dependence: Secondary | ICD-10-CM | POA: Diagnosis not present

## 2016-11-29 DIAGNOSIS — Z79891 Long term (current) use of opiate analgesic: Secondary | ICD-10-CM | POA: Diagnosis not present

## 2016-11-29 DIAGNOSIS — M1991 Primary osteoarthritis, unspecified site: Secondary | ICD-10-CM | POA: Diagnosis not present

## 2016-11-30 DIAGNOSIS — Z982 Presence of cerebrospinal fluid drainage device: Secondary | ICD-10-CM | POA: Diagnosis not present

## 2016-11-30 DIAGNOSIS — M1991 Primary osteoarthritis, unspecified site: Secondary | ICD-10-CM | POA: Diagnosis not present

## 2016-11-30 DIAGNOSIS — Z87891 Personal history of nicotine dependence: Secondary | ICD-10-CM | POA: Diagnosis not present

## 2016-11-30 DIAGNOSIS — Z79891 Long term (current) use of opiate analgesic: Secondary | ICD-10-CM | POA: Diagnosis not present

## 2016-11-30 DIAGNOSIS — I10 Essential (primary) hypertension: Secondary | ICD-10-CM | POA: Diagnosis not present

## 2016-11-30 DIAGNOSIS — G912 (Idiopathic) normal pressure hydrocephalus: Secondary | ICD-10-CM | POA: Diagnosis not present

## 2016-12-02 DIAGNOSIS — Z87891 Personal history of nicotine dependence: Secondary | ICD-10-CM | POA: Diagnosis not present

## 2016-12-02 DIAGNOSIS — I10 Essential (primary) hypertension: Secondary | ICD-10-CM | POA: Diagnosis not present

## 2016-12-02 DIAGNOSIS — G912 (Idiopathic) normal pressure hydrocephalus: Secondary | ICD-10-CM | POA: Diagnosis not present

## 2016-12-02 DIAGNOSIS — Z982 Presence of cerebrospinal fluid drainage device: Secondary | ICD-10-CM | POA: Diagnosis not present

## 2016-12-02 DIAGNOSIS — M1991 Primary osteoarthritis, unspecified site: Secondary | ICD-10-CM | POA: Diagnosis not present

## 2016-12-02 DIAGNOSIS — Z79891 Long term (current) use of opiate analgesic: Secondary | ICD-10-CM | POA: Diagnosis not present

## 2016-12-07 DIAGNOSIS — G912 (Idiopathic) normal pressure hydrocephalus: Secondary | ICD-10-CM | POA: Diagnosis not present

## 2016-12-07 DIAGNOSIS — Z982 Presence of cerebrospinal fluid drainage device: Secondary | ICD-10-CM | POA: Diagnosis not present

## 2016-12-07 DIAGNOSIS — I10 Essential (primary) hypertension: Secondary | ICD-10-CM | POA: Diagnosis not present

## 2016-12-07 DIAGNOSIS — Z87891 Personal history of nicotine dependence: Secondary | ICD-10-CM | POA: Diagnosis not present

## 2016-12-07 DIAGNOSIS — M1991 Primary osteoarthritis, unspecified site: Secondary | ICD-10-CM | POA: Diagnosis not present

## 2016-12-07 DIAGNOSIS — Z79891 Long term (current) use of opiate analgesic: Secondary | ICD-10-CM | POA: Diagnosis not present

## 2016-12-08 DIAGNOSIS — Z87891 Personal history of nicotine dependence: Secondary | ICD-10-CM | POA: Diagnosis not present

## 2016-12-08 DIAGNOSIS — G912 (Idiopathic) normal pressure hydrocephalus: Secondary | ICD-10-CM | POA: Diagnosis not present

## 2016-12-08 DIAGNOSIS — Z79891 Long term (current) use of opiate analgesic: Secondary | ICD-10-CM | POA: Diagnosis not present

## 2016-12-08 DIAGNOSIS — I10 Essential (primary) hypertension: Secondary | ICD-10-CM | POA: Diagnosis not present

## 2016-12-08 DIAGNOSIS — M1991 Primary osteoarthritis, unspecified site: Secondary | ICD-10-CM | POA: Diagnosis not present

## 2016-12-08 DIAGNOSIS — Z982 Presence of cerebrospinal fluid drainage device: Secondary | ICD-10-CM | POA: Diagnosis not present

## 2016-12-09 DIAGNOSIS — Z79891 Long term (current) use of opiate analgesic: Secondary | ICD-10-CM | POA: Diagnosis not present

## 2016-12-09 DIAGNOSIS — G912 (Idiopathic) normal pressure hydrocephalus: Secondary | ICD-10-CM | POA: Diagnosis not present

## 2016-12-09 DIAGNOSIS — M1991 Primary osteoarthritis, unspecified site: Secondary | ICD-10-CM | POA: Diagnosis not present

## 2016-12-09 DIAGNOSIS — Z87891 Personal history of nicotine dependence: Secondary | ICD-10-CM | POA: Diagnosis not present

## 2016-12-09 DIAGNOSIS — Z982 Presence of cerebrospinal fluid drainage device: Secondary | ICD-10-CM | POA: Diagnosis not present

## 2016-12-09 DIAGNOSIS — I10 Essential (primary) hypertension: Secondary | ICD-10-CM | POA: Diagnosis not present

## 2016-12-14 DIAGNOSIS — M1991 Primary osteoarthritis, unspecified site: Secondary | ICD-10-CM | POA: Diagnosis not present

## 2016-12-14 DIAGNOSIS — I10 Essential (primary) hypertension: Secondary | ICD-10-CM | POA: Diagnosis not present

## 2016-12-14 DIAGNOSIS — Z87891 Personal history of nicotine dependence: Secondary | ICD-10-CM | POA: Diagnosis not present

## 2016-12-14 DIAGNOSIS — G912 (Idiopathic) normal pressure hydrocephalus: Secondary | ICD-10-CM | POA: Diagnosis not present

## 2016-12-14 DIAGNOSIS — Z79891 Long term (current) use of opiate analgesic: Secondary | ICD-10-CM | POA: Diagnosis not present

## 2016-12-14 DIAGNOSIS — Z982 Presence of cerebrospinal fluid drainage device: Secondary | ICD-10-CM | POA: Diagnosis not present

## 2016-12-22 DIAGNOSIS — Z87891 Personal history of nicotine dependence: Secondary | ICD-10-CM | POA: Diagnosis not present

## 2016-12-22 DIAGNOSIS — I10 Essential (primary) hypertension: Secondary | ICD-10-CM | POA: Diagnosis not present

## 2016-12-22 DIAGNOSIS — G912 (Idiopathic) normal pressure hydrocephalus: Secondary | ICD-10-CM | POA: Diagnosis not present

## 2016-12-22 DIAGNOSIS — M1991 Primary osteoarthritis, unspecified site: Secondary | ICD-10-CM | POA: Diagnosis not present

## 2016-12-22 DIAGNOSIS — Z982 Presence of cerebrospinal fluid drainage device: Secondary | ICD-10-CM | POA: Diagnosis not present

## 2016-12-22 DIAGNOSIS — Z79891 Long term (current) use of opiate analgesic: Secondary | ICD-10-CM | POA: Diagnosis not present

## 2017-01-04 ENCOUNTER — Other Ambulatory Visit: Payer: Self-pay | Admitting: Neurological Surgery

## 2017-01-04 DIAGNOSIS — G919 Hydrocephalus, unspecified: Secondary | ICD-10-CM

## 2017-01-06 ENCOUNTER — Ambulatory Visit
Admission: RE | Admit: 2017-01-06 | Discharge: 2017-01-06 | Disposition: A | Discharge status: Home / Self Care | Payer: Medicare Other | Source: Ambulatory Visit | Attending: Neurological Surgery | Admitting: Neurological Surgery

## 2017-01-06 DIAGNOSIS — G919 Hydrocephalus, unspecified: Secondary | ICD-10-CM

## 2017-01-16 NOTE — Anesthesia Postprocedure Evaluation (Signed)
Anesthesia Post Note  Patient: Albert Hahn  Procedure(s) Performed: Procedure(s) (LRB): SHUNT INSERTION VENTRICULAR-PERITONEAL (N/A)     Anesthesia Post Evaluation  Last Vitals:  Vitals:   11/28/16 0400 11/28/16 0800  BP: 136/86   Pulse: (!) 55   Resp: 14   Temp: 36.6 C 36.5 C    Last Pain:  Vitals:   11/28/16 0800  TempSrc: Oral  PainSc:                  Courtez Twaddle S

## 2017-01-16 NOTE — Addendum Note (Signed)
Addendum  created 01/16/17 1318 by Eragon Hammond, MD   Sign clinical note    

## 2017-01-31 ENCOUNTER — Telehealth: Payer: Self-pay | Admitting: *Deleted

## 2017-01-31 NOTE — Telephone Encounter (Signed)
From the front desk: "Patient stopped by the office today.  States he went to Dr. Ronnald Ramp and had the surgery but Dr Ronnald Ramp messed him up.  Caused him brain damage.   States that after the surgery he had problems with his left hand.  Couldnt move his fingers.  Now is having terrible headaches.  He has been to [facility] and they agree that Dr. Ronnald Ramp messed up.  He is having another surgery at [facility] on July 9th.  Appreciates Dr. Keturah Barre for her great care but suggests that we not send anyone else to Dr. Ronnald Ramp.  Please call.  "   Will send to Dr. Brett Fairy as an Juluis Rainier.

## 2017-02-07 NOTE — Telephone Encounter (Signed)
I called Mr. Kush, who has been very unhappy with the results of his normal pressure hydrocephalus treatment. He reports having seen a neurosurgeon at [facility] for a second opinion. He reports having severe headaches and being told that the shunt does not work. He has an appointment to see Dr. Ronnald Ramp next week, but is reluctant to go back. He reports photophobia, hemiplegia and severe headache.

## 2017-04-04 DIAGNOSIS — H02043 Spastic entropion of right eye, unspecified eyelid: Secondary | ICD-10-CM | POA: Diagnosis not present

## 2017-04-14 DIAGNOSIS — H02043 Spastic entropion of right eye, unspecified eyelid: Secondary | ICD-10-CM | POA: Diagnosis not present

## 2017-04-24 ENCOUNTER — Other Ambulatory Visit (HOSPITAL_COMMUNITY): Payer: Self-pay | Admitting: Internal Medicine

## 2017-04-24 DIAGNOSIS — R319 Hematuria, unspecified: Secondary | ICD-10-CM

## 2017-04-24 DIAGNOSIS — R310 Gross hematuria: Secondary | ICD-10-CM | POA: Diagnosis not present

## 2017-04-26 ENCOUNTER — Ambulatory Visit (HOSPITAL_COMMUNITY)
Admission: RE | Admit: 2017-04-26 | Discharge: 2017-04-26 | Disposition: A | Discharge status: Home / Self Care | Payer: Medicare Other | Source: Ambulatory Visit | Attending: Internal Medicine | Admitting: Internal Medicine

## 2017-04-26 DIAGNOSIS — R319 Hematuria, unspecified: Secondary | ICD-10-CM | POA: Diagnosis not present

## 2017-04-26 DIAGNOSIS — Z8572 Personal history of non-Hodgkin lymphomas: Secondary | ICD-10-CM | POA: Diagnosis not present

## 2017-04-26 DIAGNOSIS — I700 Atherosclerosis of aorta: Secondary | ICD-10-CM | POA: Insufficient documentation

## 2017-04-26 DIAGNOSIS — I2510 Atherosclerotic heart disease of native coronary artery without angina pectoris: Secondary | ICD-10-CM | POA: Insufficient documentation

## 2017-04-26 DIAGNOSIS — N200 Calculus of kidney: Secondary | ICD-10-CM | POA: Diagnosis not present

## 2017-04-26 LAB — POCT I-STAT CREATININE: CREATININE: 1.3 mg/dL — AB (ref 0.61–1.24)

## 2017-04-26 MED ORDER — SODIUM CHLORIDE 0.9 % IV SOLN
INTRAVENOUS | Status: AC
  Filled 2017-04-26: qty 250

## 2017-04-26 MED ORDER — IOPAMIDOL (ISOVUE-300) INJECTION 61%
INTRAVENOUS | Status: AC
  Filled 2017-04-26: qty 100

## 2017-07-12 ENCOUNTER — Ambulatory Visit: Payer: Medicare Other | Admitting: Urology

## 2017-07-12 DIAGNOSIS — R310 Gross hematuria: Secondary | ICD-10-CM

## 2017-08-30 ENCOUNTER — Ambulatory Visit: Payer: Medicare Other | Admitting: Urology

## 2017-08-30 DIAGNOSIS — N400 Enlarged prostate without lower urinary tract symptoms: Secondary | ICD-10-CM

## 2017-08-30 DIAGNOSIS — R310 Gross hematuria: Secondary | ICD-10-CM | POA: Diagnosis not present

## 2017-10-27 DIAGNOSIS — Z87891 Personal history of nicotine dependence: Secondary | ICD-10-CM | POA: Diagnosis not present

## 2017-10-27 DIAGNOSIS — G912 (Idiopathic) normal pressure hydrocephalus: Secondary | ICD-10-CM | POA: Diagnosis not present

## 2017-10-27 DIAGNOSIS — G919 Hydrocephalus, unspecified: Secondary | ICD-10-CM | POA: Diagnosis not present

## 2017-10-27 DIAGNOSIS — R51 Headache: Secondary | ICD-10-CM | POA: Diagnosis not present

## 2017-11-02 DIAGNOSIS — M25461 Effusion, right knee: Secondary | ICD-10-CM | POA: Diagnosis not present

## 2017-11-29 DIAGNOSIS — G4483 Primary cough headache: Secondary | ICD-10-CM | POA: Diagnosis not present

## 2017-12-06 ENCOUNTER — Ambulatory Visit: Payer: Medicare Other | Admitting: Urology

## 2017-12-14 DIAGNOSIS — G912 (Idiopathic) normal pressure hydrocephalus: Secondary | ICD-10-CM | POA: Diagnosis not present

## 2018-01-25 DIAGNOSIS — G912 (Idiopathic) normal pressure hydrocephalus: Secondary | ICD-10-CM | POA: Diagnosis not present

## 2018-02-19 DIAGNOSIS — R51 Headache: Secondary | ICD-10-CM | POA: Diagnosis not present

## 2018-02-19 DIAGNOSIS — G919 Hydrocephalus, unspecified: Secondary | ICD-10-CM | POA: Diagnosis not present

## 2018-04-30 DIAGNOSIS — H02043 Spastic entropion of right eye, unspecified eyelid: Secondary | ICD-10-CM | POA: Diagnosis not present

## 2018-04-30 DIAGNOSIS — H2513 Age-related nuclear cataract, bilateral: Secondary | ICD-10-CM | POA: Diagnosis not present

## 2018-06-04 DIAGNOSIS — G919 Hydrocephalus, unspecified: Secondary | ICD-10-CM | POA: Diagnosis not present

## 2018-08-23 DIAGNOSIS — R05 Cough: Secondary | ICD-10-CM | POA: Diagnosis not present

## 2019-01-04 DIAGNOSIS — R05 Cough: Secondary | ICD-10-CM | POA: Diagnosis not present

## 2019-06-18 DIAGNOSIS — Z23 Encounter for immunization: Secondary | ICD-10-CM | POA: Diagnosis not present

## 2019-07-29 ENCOUNTER — Other Ambulatory Visit (HOSPITAL_COMMUNITY): Payer: Self-pay | Admitting: Internal Medicine

## 2019-07-29 ENCOUNTER — Ambulatory Visit (HOSPITAL_COMMUNITY)
Admission: RE | Admit: 2019-07-29 | Discharge: 2019-07-29 | Disposition: A | Discharge status: Home / Self Care | Payer: Medicare Other | Source: Ambulatory Visit | Attending: Internal Medicine | Admitting: Internal Medicine

## 2019-07-29 ENCOUNTER — Other Ambulatory Visit: Payer: Self-pay

## 2019-07-29 DIAGNOSIS — R911 Solitary pulmonary nodule: Secondary | ICD-10-CM

## 2019-07-29 DIAGNOSIS — R918 Other nonspecific abnormal finding of lung field: Secondary | ICD-10-CM | POA: Diagnosis not present

## 2019-07-29 DIAGNOSIS — J984 Other disorders of lung: Secondary | ICD-10-CM | POA: Diagnosis not present

## 2019-10-07 ENCOUNTER — Emergency Department (HOSPITAL_COMMUNITY): Payer: No Typology Code available for payment source

## 2019-10-07 ENCOUNTER — Inpatient Hospital Stay (HOSPITAL_COMMUNITY)
Admission: EM | Admit: 2019-10-07 | Discharge: 2019-10-13 | DRG: 177 | Disposition: A | Discharge status: Skilled Nursing Facility | Payer: No Typology Code available for payment source | Attending: Internal Medicine | Admitting: Internal Medicine

## 2019-10-07 ENCOUNTER — Encounter (HOSPITAL_COMMUNITY): Payer: Self-pay

## 2019-10-07 ENCOUNTER — Other Ambulatory Visit: Payer: Self-pay

## 2019-10-07 DIAGNOSIS — Z8572 Personal history of non-Hodgkin lymphomas: Secondary | ICD-10-CM | POA: Diagnosis not present

## 2019-10-07 DIAGNOSIS — Z923 Personal history of irradiation: Secondary | ICD-10-CM | POA: Diagnosis not present

## 2019-10-07 DIAGNOSIS — F329 Major depressive disorder, single episode, unspecified: Secondary | ICD-10-CM | POA: Diagnosis present

## 2019-10-07 DIAGNOSIS — Z885 Allergy status to narcotic agent status: Secondary | ICD-10-CM

## 2019-10-07 DIAGNOSIS — Z79891 Long term (current) use of opiate analgesic: Secondary | ICD-10-CM

## 2019-10-07 DIAGNOSIS — G919 Hydrocephalus, unspecified: Secondary | ICD-10-CM | POA: Diagnosis not present

## 2019-10-07 DIAGNOSIS — Z982 Presence of cerebrospinal fluid drainage device: Secondary | ICD-10-CM

## 2019-10-07 DIAGNOSIS — R7401 Elevation of levels of liver transaminase levels: Secondary | ICD-10-CM

## 2019-10-07 DIAGNOSIS — R509 Fever, unspecified: Secondary | ICD-10-CM

## 2019-10-07 DIAGNOSIS — F05 Delirium due to known physiological condition: Secondary | ICD-10-CM | POA: Diagnosis not present

## 2019-10-07 DIAGNOSIS — J069 Acute upper respiratory infection, unspecified: Secondary | ICD-10-CM | POA: Diagnosis present

## 2019-10-07 DIAGNOSIS — I10 Essential (primary) hypertension: Secondary | ICD-10-CM | POA: Diagnosis not present

## 2019-10-07 DIAGNOSIS — J1282 Pneumonia due to coronavirus disease 2019: Secondary | ICD-10-CM | POA: Diagnosis not present

## 2019-10-07 DIAGNOSIS — R0689 Other abnormalities of breathing: Secondary | ICD-10-CM | POA: Diagnosis not present

## 2019-10-07 DIAGNOSIS — F419 Anxiety disorder, unspecified: Secondary | ICD-10-CM | POA: Diagnosis present

## 2019-10-07 DIAGNOSIS — E876 Hypokalemia: Secondary | ICD-10-CM

## 2019-10-07 DIAGNOSIS — E869 Volume depletion, unspecified: Secondary | ICD-10-CM | POA: Diagnosis present

## 2019-10-07 DIAGNOSIS — R600 Localized edema: Secondary | ICD-10-CM | POA: Diagnosis not present

## 2019-10-07 DIAGNOSIS — M6281 Muscle weakness (generalized): Secondary | ICD-10-CM | POA: Diagnosis not present

## 2019-10-07 DIAGNOSIS — Z9181 History of falling: Secondary | ICD-10-CM | POA: Diagnosis not present

## 2019-10-07 DIAGNOSIS — J439 Emphysema, unspecified: Secondary | ICD-10-CM | POA: Diagnosis not present

## 2019-10-07 DIAGNOSIS — R531 Weakness: Secondary | ICD-10-CM | POA: Diagnosis not present

## 2019-10-07 DIAGNOSIS — R0602 Shortness of breath: Secondary | ICD-10-CM

## 2019-10-07 DIAGNOSIS — K4090 Unilateral inguinal hernia, without obstruction or gangrene, not specified as recurrent: Secondary | ICD-10-CM | POA: Diagnosis not present

## 2019-10-07 DIAGNOSIS — Z9221 Personal history of antineoplastic chemotherapy: Secondary | ICD-10-CM

## 2019-10-07 DIAGNOSIS — U071 COVID-19: Secondary | ICD-10-CM | POA: Diagnosis present

## 2019-10-07 DIAGNOSIS — L89311 Pressure ulcer of right buttock, stage 1: Secondary | ICD-10-CM | POA: Diagnosis present

## 2019-10-07 DIAGNOSIS — Z87891 Personal history of nicotine dependence: Secondary | ICD-10-CM

## 2019-10-07 DIAGNOSIS — I129 Hypertensive chronic kidney disease with stage 1 through stage 4 chronic kidney disease, or unspecified chronic kidney disease: Secondary | ICD-10-CM | POA: Diagnosis present

## 2019-10-07 DIAGNOSIS — R05 Cough: Secondary | ICD-10-CM | POA: Diagnosis not present

## 2019-10-07 DIAGNOSIS — F4310 Post-traumatic stress disorder, unspecified: Secondary | ICD-10-CM | POA: Diagnosis present

## 2019-10-07 DIAGNOSIS — R29898 Other symptoms and signs involving the musculoskeletal system: Secondary | ICD-10-CM | POA: Diagnosis not present

## 2019-10-07 DIAGNOSIS — I2699 Other pulmonary embolism without acute cor pulmonale: Secondary | ICD-10-CM

## 2019-10-07 DIAGNOSIS — Z91013 Allergy to seafood: Secondary | ICD-10-CM

## 2019-10-07 DIAGNOSIS — G9341 Metabolic encephalopathy: Secondary | ICD-10-CM

## 2019-10-07 DIAGNOSIS — N179 Acute kidney failure, unspecified: Secondary | ICD-10-CM | POA: Diagnosis not present

## 2019-10-07 DIAGNOSIS — R41841 Cognitive communication deficit: Secondary | ICD-10-CM | POA: Diagnosis present

## 2019-10-07 DIAGNOSIS — N400 Enlarged prostate without lower urinary tract symptoms: Secondary | ICD-10-CM | POA: Diagnosis present

## 2019-10-07 DIAGNOSIS — R262 Difficulty in walking, not elsewhere classified: Secondary | ICD-10-CM | POA: Diagnosis present

## 2019-10-07 DIAGNOSIS — R278 Other lack of coordination: Secondary | ICD-10-CM | POA: Diagnosis not present

## 2019-10-07 DIAGNOSIS — Z743 Need for continuous supervision: Secondary | ICD-10-CM | POA: Diagnosis not present

## 2019-10-07 DIAGNOSIS — C8590 Non-Hodgkin lymphoma, unspecified, unspecified site: Secondary | ICD-10-CM | POA: Diagnosis present

## 2019-10-07 DIAGNOSIS — R1312 Dysphagia, oropharyngeal phase: Secondary | ICD-10-CM | POA: Diagnosis not present

## 2019-10-07 DIAGNOSIS — Z79899 Other long term (current) drug therapy: Secondary | ICD-10-CM

## 2019-10-07 DIAGNOSIS — R062 Wheezing: Secondary | ICD-10-CM | POA: Diagnosis not present

## 2019-10-07 DIAGNOSIS — G9340 Encephalopathy, unspecified: Secondary | ICD-10-CM | POA: Diagnosis not present

## 2019-10-07 DIAGNOSIS — Z96649 Presence of unspecified artificial hip joint: Secondary | ICD-10-CM | POA: Diagnosis present

## 2019-10-07 DIAGNOSIS — Z8249 Family history of ischemic heart disease and other diseases of the circulatory system: Secondary | ICD-10-CM

## 2019-10-07 DIAGNOSIS — N1831 Chronic kidney disease, stage 3a: Secondary | ICD-10-CM | POA: Diagnosis not present

## 2019-10-07 DIAGNOSIS — Z981 Arthrodesis status: Secondary | ICD-10-CM | POA: Diagnosis not present

## 2019-10-07 DIAGNOSIS — R918 Other nonspecific abnormal finding of lung field: Secondary | ICD-10-CM | POA: Diagnosis not present

## 2019-10-07 DIAGNOSIS — R6510 Systemic inflammatory response syndrome (SIRS) of non-infectious origin without acute organ dysfunction: Secondary | ICD-10-CM | POA: Diagnosis not present

## 2019-10-07 DIAGNOSIS — K7689 Other specified diseases of liver: Secondary | ICD-10-CM | POA: Diagnosis not present

## 2019-10-07 DIAGNOSIS — R109 Unspecified abdominal pain: Secondary | ICD-10-CM

## 2019-10-07 DIAGNOSIS — R404 Transient alteration of awareness: Secondary | ICD-10-CM | POA: Diagnosis not present

## 2019-10-07 DIAGNOSIS — R0600 Dyspnea, unspecified: Secondary | ICD-10-CM | POA: Diagnosis not present

## 2019-10-07 DIAGNOSIS — N184 Chronic kidney disease, stage 4 (severe): Secondary | ICD-10-CM

## 2019-10-07 LAB — COMPREHENSIVE METABOLIC PANEL
ALT: 36 U/L (ref 0–44)
AST: 62 U/L — ABNORMAL HIGH (ref 15–41)
Albumin: 3.7 g/dL (ref 3.5–5.0)
Alkaline Phosphatase: 83 U/L (ref 38–126)
Anion gap: 14 (ref 5–15)
BUN: 34 mg/dL — ABNORMAL HIGH (ref 8–23)
CO2: 19 mmol/L — ABNORMAL LOW (ref 22–32)
Calcium: 8.4 mg/dL — ABNORMAL LOW (ref 8.9–10.3)
Chloride: 100 mmol/L (ref 98–111)
Creatinine, Ser: 2.11 mg/dL — ABNORMAL HIGH (ref 0.61–1.24)
GFR calc Af Amer: 35 mL/min — ABNORMAL LOW (ref >60)
GFR calc non Af Amer: 30 mL/min — ABNORMAL LOW (ref >60)
Glucose, Bld: 109 mg/dL — ABNORMAL HIGH (ref 70–99)
Potassium: 2.1 mmol/L — CL (ref 3.5–5.1)
Sodium: 133 mmol/L — ABNORMAL LOW (ref 135–145)
Total Bilirubin: 0.7 mg/dL (ref 0.3–1.2)
Total Protein: 7.2 g/dL (ref 6.5–8.1)

## 2019-10-07 LAB — LACTATE DEHYDROGENASE: LDH: 212 U/L — ABNORMAL HIGH (ref 98–192)

## 2019-10-07 LAB — URINALYSIS, ROUTINE W REFLEX MICROSCOPIC
Bacteria, UA: NONE SEEN
Bilirubin Urine: NEGATIVE
Glucose, UA: NEGATIVE mg/dL
Hgb urine dipstick: NEGATIVE
Ketones, ur: NEGATIVE mg/dL
Leukocytes,Ua: NEGATIVE
Nitrite: NEGATIVE
Protein, ur: 100 mg/dL — AB
Specific Gravity, Urine: 1.015 (ref 1.005–1.030)
pH: 5 (ref 5.0–8.0)

## 2019-10-07 LAB — D-DIMER, QUANTITATIVE: D-Dimer, Quant: 6.87 ug/mL-FEU — ABNORMAL HIGH (ref 0.00–0.50)

## 2019-10-07 LAB — POC SARS CORONAVIRUS 2 AG -  ED
SARS Coronavirus 2 Ag: POSITIVE — AB
SARS Coronavirus 2 Ag: POSITIVE — AB

## 2019-10-07 LAB — CBC WITH DIFFERENTIAL/PLATELET
Abs Immature Granulocytes: 0.02 10*3/uL (ref 0.00–0.07)
Basophils Absolute: 0 10*3/uL (ref 0.0–0.1)
Basophils Relative: 0 %
Eosinophils Absolute: 0 10*3/uL (ref 0.0–0.5)
Eosinophils Relative: 0 %
HCT: 44.2 % (ref 39.0–52.0)
Hemoglobin: 15.3 g/dL (ref 13.0–17.0)
Immature Granulocytes: 0 %
Lymphocytes Relative: 17 %
Lymphs Abs: 1 10*3/uL (ref 0.7–4.0)
MCH: 32 pg (ref 26.0–34.0)
MCHC: 34.6 g/dL (ref 30.0–36.0)
MCV: 92.5 fL (ref 80.0–100.0)
Monocytes Absolute: 0.7 10*3/uL (ref 0.1–1.0)
Monocytes Relative: 11 %
Neutro Abs: 4.2 10*3/uL (ref 1.7–7.7)
Neutrophils Relative %: 72 %
Platelets: 189 10*3/uL (ref 150–400)
RBC: 4.78 MIL/uL (ref 4.22–5.81)
RDW: 12.8 % (ref 11.5–15.5)
WBC: 5.9 10*3/uL (ref 4.0–10.5)
nRBC: 0 % (ref 0.0–0.2)

## 2019-10-07 LAB — RENAL FUNCTION PANEL
Albumin: 3.4 g/dL — ABNORMAL LOW (ref 3.5–5.0)
Anion gap: 14 (ref 5–15)
BUN: 34 mg/dL — ABNORMAL HIGH (ref 8–23)
CO2: 18 mmol/L — ABNORMAL LOW (ref 22–32)
Calcium: 7.9 mg/dL — ABNORMAL LOW (ref 8.9–10.3)
Chloride: 102 mmol/L (ref 98–111)
Creatinine, Ser: 2.02 mg/dL — ABNORMAL HIGH (ref 0.61–1.24)
GFR calc Af Amer: 37 mL/min — ABNORMAL LOW (ref >60)
GFR calc non Af Amer: 32 mL/min — ABNORMAL LOW (ref >60)
Glucose, Bld: 117 mg/dL — ABNORMAL HIGH (ref 70–99)
Phosphorus: 3.5 mg/dL (ref 2.5–4.6)
Potassium: 2.6 mmol/L — CL (ref 3.5–5.1)
Sodium: 134 mmol/L — ABNORMAL LOW (ref 135–145)

## 2019-10-07 LAB — FERRITIN: Ferritin: 238 ng/mL (ref 24–336)

## 2019-10-07 LAB — PROTIME-INR
INR: 1 (ref 0.8–1.2)
Prothrombin Time: 13 seconds (ref 11.4–15.2)

## 2019-10-07 LAB — FIBRINOGEN: Fibrinogen: 576 mg/dL — ABNORMAL HIGH (ref 210–475)

## 2019-10-07 LAB — LACTIC ACID, PLASMA
Lactic Acid, Venous: 0.9 mmol/L (ref 0.5–1.9)
Lactic Acid, Venous: 0.8 mmol/L (ref 0.5–1.9)

## 2019-10-07 LAB — ABO/RH: ABO/RH(D): O NEG

## 2019-10-07 LAB — TRIGLYCERIDES: Triglycerides: 92 mg/dL (ref <150)

## 2019-10-07 LAB — INFLUENZA PANEL BY PCR (TYPE A & B)
Influenza A By PCR: NEGATIVE
Influenza B By PCR: NEGATIVE

## 2019-10-07 LAB — APTT: aPTT: 31 seconds (ref 24–36)

## 2019-10-07 LAB — MAGNESIUM: Magnesium: 1.9 mg/dL (ref 1.7–2.4)

## 2019-10-07 LAB — PROCALCITONIN: Procalcitonin: 0.13 ng/mL

## 2019-10-07 LAB — C-REACTIVE PROTEIN: CRP: 9.2 mg/dL — ABNORMAL HIGH (ref <1.0)

## 2019-10-07 MED ORDER — POTASSIUM CHLORIDE CRYS ER 20 MEQ PO TBCR
40.0000 meq | EXTENDED_RELEASE_TABLET | ORAL | Status: AC
  Administered 2019-10-07 (×2): 40 meq via ORAL
  Filled 2019-10-07 (×2): qty 2

## 2019-10-07 MED ORDER — POLYETHYLENE GLYCOL 3350 17 G PO PACK: 17.0000 g | PACK | Freq: Every day | ORAL | Status: DC | PRN

## 2019-10-07 MED ORDER — ACETAMINOPHEN 500 MG PO TABS
500.0000 mg | ORAL_TABLET | Freq: Four times a day (QID) | ORAL | Status: DC | PRN
  Administered 2019-10-08: 1000 mg via ORAL
  Administered 2019-10-10 – 2019-10-11 (×2): 500 mg via ORAL
  Filled 2019-10-07: qty 2
  Filled 2019-10-07 (×2): qty 1

## 2019-10-07 MED ORDER — SODIUM CHLORIDE 0.9 % IV SOLN
200.0000 mg | Freq: Once | INTRAVENOUS | Status: DC
  Filled 2019-10-07: qty 40

## 2019-10-07 MED ORDER — ALPRAZOLAM 1 MG PO TABS: 1.0000 mg | ORAL_TABLET | ORAL | Status: DC | PRN

## 2019-10-07 MED ORDER — SODIUM CHLORIDE 0.9 % IV SOLN
2.0000 g | Freq: Two times a day (BID) | INTRAVENOUS | Status: DC
  Administered 2019-10-08: 2 g via INTRAVENOUS
  Filled 2019-10-07: qty 2

## 2019-10-07 MED ORDER — ONDANSETRON HCL 4 MG/2ML IJ SOLN: 4.0000 mg | Freq: Four times a day (QID) | INTRAMUSCULAR | Status: DC | PRN

## 2019-10-07 MED ORDER — HYDROCOD POLST-CPM POLST ER 10-8 MG/5ML PO SUER: 5.0000 mL | Freq: Two times a day (BID) | ORAL | Status: DC | PRN

## 2019-10-07 MED ORDER — AMITRIPTYLINE HCL 25 MG PO TABS
50.0000 mg | ORAL_TABLET | Freq: Every day | ORAL | Status: DC
  Administered 2019-10-07 – 2019-10-12 (×6): 50 mg via ORAL
  Filled 2019-10-07 (×6): qty 2

## 2019-10-07 MED ORDER — MAGNESIUM SULFATE 2 GM/50ML IV SOLN
2.0000 g | Freq: Once | INTRAVENOUS | Status: AC
  Administered 2019-10-07: 2 g via INTRAVENOUS
  Filled 2019-10-07: qty 50

## 2019-10-07 MED ORDER — TERAZOSIN HCL 1 MG PO CAPS
4.0000 mg | ORAL_CAPSULE | Freq: Every day | ORAL | Status: DC
  Administered 2019-10-07 – 2019-10-12 (×6): 4 mg via ORAL
  Filled 2019-10-07 (×6): qty 4

## 2019-10-07 MED ORDER — SODIUM CHLORIDE 0.9 % IV SOLN: 250.0000 mL | INTRAVENOUS | Status: DC | PRN

## 2019-10-07 MED ORDER — METRONIDAZOLE IN NACL 5-0.79 MG/ML-% IV SOLN
500.0000 mg | Freq: Once | INTRAVENOUS | Status: AC
  Administered 2019-10-07: 500 mg via INTRAVENOUS
  Filled 2019-10-07: qty 100

## 2019-10-07 MED ORDER — FOLIC ACID 1 MG PO TABS
1.0000 mg | ORAL_TABLET | Freq: Every day | ORAL | Status: DC
  Administered 2019-10-07 – 2019-10-13 (×7): 1 mg via ORAL
  Filled 2019-10-07 (×7): qty 1

## 2019-10-07 MED ORDER — SODIUM CHLORIDE 0.9 % IV SOLN
100.0000 mg | Freq: Every day | INTRAVENOUS | Status: DC
  Filled 2019-10-07: qty 20

## 2019-10-07 MED ORDER — SODIUM CHLORIDE 0.9 % IV BOLUS
1000.0000 mL | Freq: Once | INTRAVENOUS | Status: AC
  Administered 2019-10-07: 1000 mL via INTRAVENOUS

## 2019-10-07 MED ORDER — VANCOMYCIN HCL 1250 MG/250ML IV SOLN
1250.0000 mg | INTRAVENOUS | Status: DC
  Filled 2019-10-07: qty 250

## 2019-10-07 MED ORDER — VANCOMYCIN HCL IN DEXTROSE 1-5 GM/200ML-% IV SOLN: 1000.0000 mg | Freq: Once | INTRAVENOUS | Status: DC

## 2019-10-07 MED ORDER — GUAIFENESIN-DM 100-10 MG/5ML PO SYRP: 10.0000 mL | ORAL_SOLUTION | ORAL | Status: DC | PRN

## 2019-10-07 MED ORDER — ZINC SULFATE 220 (50 ZN) MG PO CAPS
220.0000 mg | ORAL_CAPSULE | Freq: Every day | ORAL | Status: DC
  Administered 2019-10-07 – 2019-10-13 (×7): 220 mg via ORAL
  Filled 2019-10-07 (×7): qty 1

## 2019-10-07 MED ORDER — HEPARIN SODIUM (PORCINE) 5000 UNIT/ML IJ SOLN
5000.0000 [IU] | Freq: Three times a day (TID) | INTRAMUSCULAR | Status: DC
  Administered 2019-10-07 – 2019-10-13 (×16): 5000 [IU] via SUBCUTANEOUS
  Filled 2019-10-07 (×15): qty 1

## 2019-10-07 MED ORDER — TEMAZEPAM 15 MG PO CAPS
15.0000 mg | ORAL_CAPSULE | Freq: Every day | ORAL | Status: DC
  Administered 2019-10-08: 15 mg via ORAL
  Filled 2019-10-07: qty 1

## 2019-10-07 MED ORDER — ADULT MULTIVITAMIN W/MINERALS CH
1.0000 | ORAL_TABLET | Freq: Every day | ORAL | Status: DC
  Administered 2019-10-07 – 2019-10-13 (×7): 1 via ORAL
  Filled 2019-10-07 (×7): qty 1

## 2019-10-07 MED ORDER — OXYCODONE HCL 5 MG PO TABS: 5.0000 mg | ORAL_TABLET | Freq: Four times a day (QID) | ORAL | Status: DC | PRN

## 2019-10-07 MED ORDER — SODIUM CHLORIDE 0.9% FLUSH
3.0000 mL | Freq: Two times a day (BID) | INTRAVENOUS | Status: DC
  Administered 2019-10-07 – 2019-10-13 (×13): 3 mL via INTRAVENOUS

## 2019-10-07 MED ORDER — POLYETHYLENE GLYCOL 3350 17 G PO PACK
17.0000 g | PACK | Freq: Every day | ORAL | Status: DC
  Administered 2019-10-07 – 2019-10-13 (×5): 17 g via ORAL
  Filled 2019-10-07 (×5): qty 1

## 2019-10-07 MED ORDER — SODIUM CHLORIDE 0.9 % IV SOLN
2.0000 g | Freq: Once | INTRAVENOUS | Status: AC
  Administered 2019-10-07: 2 g via INTRAVENOUS
  Filled 2019-10-07: qty 2

## 2019-10-07 MED ORDER — SENNOSIDES-DOCUSATE SODIUM 8.6-50 MG PO TABS
2.0000 | ORAL_TABLET | Freq: Every day | ORAL | Status: DC
  Administered 2019-10-07 – 2019-10-12 (×6): 2 via ORAL
  Filled 2019-10-07 (×6): qty 2

## 2019-10-07 MED ORDER — POTASSIUM CHLORIDE 10 MEQ/100ML IV SOLN
10.0000 meq | INTRAVENOUS | Status: AC
  Administered 2019-10-07 (×4): 10 meq via INTRAVENOUS
  Filled 2019-10-07 (×3): qty 100

## 2019-10-07 MED ORDER — SODIUM CHLORIDE 0.9% FLUSH: 3.0000 mL | INTRAVENOUS | Status: DC | PRN

## 2019-10-07 MED ORDER — POLYVINYL ALCOHOL 1.4 % OP SOLN
1.0000 [drp] | Freq: Every day | OPHTHALMIC | Status: DC
  Administered 2019-10-08 – 2019-10-11 (×4): 2 [drp] via OPHTHALMIC
  Administered 2019-10-12: 1 [drp] via OPHTHALMIC
  Administered 2019-10-13: 2 [drp] via OPHTHALMIC
  Filled 2019-10-07 (×4): qty 15

## 2019-10-07 MED ORDER — DEXAMETHASONE SODIUM PHOSPHATE 10 MG/ML IJ SOLN
6.0000 mg | Freq: Once | INTRAMUSCULAR | Status: DC
  Administered 2019-10-07: 6 mg via INTRAVENOUS
  Filled 2019-10-07: qty 1

## 2019-10-07 MED ORDER — ACETAMINOPHEN 650 MG RE SUPP: 650.0000 mg | Freq: Four times a day (QID) | RECTAL | Status: DC | PRN

## 2019-10-07 MED ORDER — ASCORBIC ACID 500 MG PO TABS
500.0000 mg | ORAL_TABLET | Freq: Every day | ORAL | Status: DC
  Administered 2019-10-07 – 2019-10-13 (×7): 500 mg via ORAL
  Filled 2019-10-07 (×7): qty 1

## 2019-10-07 MED ORDER — ACETAMINOPHEN 325 MG PO TABS: 650.0000 mg | ORAL_TABLET | Freq: Four times a day (QID) | ORAL | Status: DC | PRN

## 2019-10-07 MED ORDER — TOPIRAMATE 100 MG PO TABS
100.0000 mg | ORAL_TABLET | Freq: Every day | ORAL | Status: DC
  Administered 2019-10-07 – 2019-10-13 (×7): 100 mg via ORAL
  Filled 2019-10-07 (×7): qty 1

## 2019-10-07 MED ORDER — ACETAMINOPHEN 325 MG PO TABS
650.0000 mg | ORAL_TABLET | Freq: Once | ORAL | Status: AC
  Administered 2019-10-07: 650 mg via ORAL
  Filled 2019-10-07: qty 2

## 2019-10-07 MED ORDER — VANCOMYCIN HCL 2000 MG/400ML IV SOLN
2000.0000 mg | Freq: Once | INTRAVENOUS | Status: AC
  Administered 2019-10-07: 2000 mg via INTRAVENOUS
  Filled 2019-10-07: qty 400

## 2019-10-07 MED ORDER — AMLODIPINE BESYLATE 5 MG PO TABS
10.0000 mg | ORAL_TABLET | Freq: Every day | ORAL | Status: DC
  Administered 2019-10-08 – 2019-10-13 (×6): 10 mg via ORAL
  Filled 2019-10-07 (×7): qty 2

## 2019-10-07 MED ORDER — ONDANSETRON HCL 4 MG PO TABS: 4.0000 mg | ORAL_TABLET | Freq: Four times a day (QID) | ORAL | Status: DC | PRN

## 2019-10-07 MED ORDER — TRAZODONE HCL 50 MG PO TABS
50.0000 mg | ORAL_TABLET | Freq: Every evening | ORAL | Status: DC | PRN
  Filled 2019-10-07: qty 1

## 2019-10-07 MED ORDER — THIAMINE HCL 100 MG PO TABS
100.0000 mg | ORAL_TABLET | Freq: Every day | ORAL | Status: DC
  Administered 2019-10-07 – 2019-10-13 (×7): 100 mg via ORAL
  Filled 2019-10-07 (×7): qty 1

## 2019-10-07 MED ORDER — POTASSIUM CHLORIDE IN NACL 40-0.9 MEQ/L-% IV SOLN
INTRAVENOUS | Status: DC
  Administered 2019-10-07 – 2019-10-08 (×2): 125 mL/h via INTRAVENOUS
  Filled 2019-10-07 (×4): qty 1000

## 2019-10-07 NOTE — Progress Notes (Signed)
Pharmacy Antibiotic Note  Albert Hahn is a 74 y.o. male admitted on 10/07/2019 with infection- unknown source.  Pharmacy has been consulted for Vancomycin and Cefepime dosing.  Plan: Vancomycin 2000 mg IV x 1 dose. Vancomycin 1250 mg IV every 24 hours.  Goal trough 15-20 mcg/mL. Cefepime 2000 mg IV every 12 hours. Monitor labs, c/s, and vanco level as indicated.  Height: 5\' 7"  (170.2 cm) Weight: 209 lb (94.8 kg) IBW/kg (Calculated) : 66.1  Temp (24hrs), Avg:100.3 F (37.9 C), Min:98.8 F (37.1 C), Max:101.8 F (38.8 C)  Recent Labs  Lab 10/07/19 1013 10/07/19 1148  WBC 5.9  --   CREATININE 2.11*  --   LATICACIDVEN  --  0.9    Estimated Creatinine Clearance: 34.7 mL/min (A) (by C-G formula based on SCr of 2.11 mg/dL (H)).    Allergies  Allergen Reactions  . Bee Venom Anaphylaxis  . Shellfish Allergy Anaphylaxis  . Codeine Nausea And Vomiting    Antimicrobials this admission: Vanco 2/22 >>  Cefepime 2/22 >>  Flagyl x 1  Dose adjustments this admission: Vanco/Cefepime  Microbiology results: 2/22 BCx: pending 2/22 UCx: pending    Thank you for allowing pharmacy to be a part of this patient's care.  Margot Ables, PharmD Clinical Pharmacist 10/07/2019 2:00 PM

## 2019-10-07 NOTE — H&P (Addendum)
Patient Demographics:    Albert Hahn, is a 74 y.o. male  MRN: [id number]   DOB - [birth year]  Admit Date - 10/07/2019  Outpatient Primary MD for the patient is Asencion Noble, MD   Assessment & Plan:    Principal Problem:   COVID-19 virus infection Active Problems:   Acute respiratory disease due to COVID-19 virus   Acute hypokalemia   SIRS (systemic inflammatory response syndrome) ([place])   S/P VP shunt   Non Hodgkin's lymphoma ([place])   1)SIRs----POA-- suspect secondary to COVID-19 infection, patient met SIRS criteria, patient does not meet sepsis criteria -Chest x-ray without acute findings, UA not consistent with UTI, WBCs only 5.9, lactic acid is 0.8 -Procalcitonin 0.13--- presently with Vanco and cefepime pending blood cultures and further clinical/lab data -Influenza negative  2) COVID-19 infection--- no hypoxia, no pneumonia on chest x-ray -Patient apparently got his first dose of the COVID-19 vaccine few weeks ago, he was due for the second dose of COVID-19 vaccine on Saturday, 10/12/2019 -Patient had fever and tachypnea, -CRP is 9.2, D-dimer 6.87 (No hypoxia), LDH elevated at 212, fibrinogen elevated at 576, -Ferritin 238, triglycerides 92,  -Check and trend inflammatory markers .--patient does NOT meet criteria for initiation of Remdesivir or  Steroid therapy per protocol  --Supplemental oxygen to keep O2 sats above 93% -Follow serial chest x-rays and ABGs as indicated --- Encourage prone positioning for More than 16 hours/day in increments of 2 to 3 hours at a time if able to tolerate --Attempt to maintain euvolemic state --Zinc and vitamin C as ordered -Albuterol inhaler as needed -Check lower extremity venous Dopplers given elevated D-dimer (may be Covid related), unable to do CTA due to renal  function, VQ scan not available at [facility]  3)AKI----acute kidney injury on CKD stage -   creatinine on admission= 2.1, baseline creatinine =    , creatinine is now=  , renally adjust medications, avoid nephrotoxic agents / dehydration  / hypotension  4)Hypokalemia--- potassium is 2.1, stop HCTZ, replace potassium,  empirically replace magnesium pending serum magnesium levels  5)HTN-stable continue Amlodipine 10 mg daily, hold HCTZ due to hypokalemia  6)Anxiety Disorder/Depression/PTSD --- stable continue temazepam 15 mg nightly, and Xanax as needed, continue with Topamax   7)BPH--stable, continue Hytrin  8)Neuro--- status post prior VP shunt placement, CT head without acute findings  With History of - Reviewed by me  Past Medical History:  Diagnosis Date  . Anxiety   . Arthritis   . Complication of anesthesia    woke up during lymph node removal, facial surgery  . Depression   . H/O emphysema   . Headache(784.0)   . History of hiatal hernia    "doesnt bother me"  . Hypertension   . 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 PTSD (post-traumatic stress disorder)       Past Surgical History:  Procedure Laterality Date  . BACK SURGERY  L3-L4 Fusion per pt  . COLONOSCOPY W/ POLYPECTOMY    . HERNIA REPAIR    . HIP SURGERY    . JOINT REPLACEMENT    . LUMBAR PUNCTURE  07/23/2012   severe headaches  . NECK SURGERY     metal plates and screws in neck   . TOTAL HIP ARTHROPLASTY    . VENTRICULOPERITONEAL SHUNT N/A 11/25/2016   Procedure: SHUNT INSERTION VENTRICULAR-PERITONEAL;  Surgeon: Eustace Moore, MD;  Location: [place];  Service: Neurosurgery;  Laterality: N/A;    Chief Complaint  Patient presents with  . Fever      HPI:    Albert Hahn  is a 74 y.o. male with past medical history relevant for history of VP shunt, PTSD, HTN, with recent COVID-19 exposure who presents to the ED with weakness, fatigue, fevers, tachypnea without hypoxia  In Ed Chest x-ray  without acute findings, UA not consistent with UTI, WBCs only 5.9, lactic acid is 0.8 -Procalcitonin 0.13--- , -Influenza negative -CRP is 9.2, D-dimer 6.87 (No hypoxia), LDH elevated at 212, fibrinogen elevated at 576, -Ferritin 238, triglycerides 92,  -Creatinine is 2.1, potassium is also 2.1, serum ANCA pending  No productive cough, no vomiting or diarrhea  --Patient apparently got his first dose of the COVID-19 vaccine few weeks ago, he was due for the second dose of COVID-19 vaccine on Saturday, 10/12/2019    Review of systems:    In addition to the HPI above,   A full Review of  Systems was done, all other systems reviewed are negative except as noted above in HPI , .    Social History:  Reviewed by me    Social History   Tobacco Use  . Smoking status: Former Research scientist (life sciences)  . Smokeless tobacco: Never Used  . Tobacco comment: Stopped smoking 29 years ago   Substance Use Topics  . Alcohol use: No    Alcohol/week: 1.0 standard drinks    Types: 1 Glasses of wine per week     Family History :  Reviewed by me  HTN   Home Medications:   Prior to Admission medications   Medication Sig Start Date End Date Taking? Authorizing Provider  acetaminophen (TYLENOL) 500 MG tablet Take 500-1,000 mg by mouth every 6 (six) hours as needed for mild pain or moderate pain.   Yes [provider]  amitriptyline (ELAVIL) 50 MG tablet Take 1 tablet by mouth at bedtime. 75 mg at bedtime   Yes [provider]  amLODipine (NORVASC) 10 MG tablet Take 10 mg by mouth daily.   Yes [provider]  docusate sodium (COLACE) 100 MG capsule Take 500 mg by mouth daily.    Yes [provider]  hydrochlorothiazide (HYDRODIURIL) 25 MG tablet Take 25 mg by mouth daily.   Yes [provider]  oxyCODONE (OXY IR/ROXICODONE) 5 MG immediate release tablet Take 5 mg by mouth every 6 (six) hours as needed (for pain.).   Yes [provider]  Polyethyl Glycol-Propyl  Glycol (LUBRICANT EYE DROPS) 0.4-0.3 % SOLN Place 1-2 drops into both eyes daily.   Yes [provider]  temazepam (RESTORIL) 15 MG capsule Take 15 mg by mouth at bedtime. sleep   Yes [provider]  terazosin (HYTRIN) 2 MG capsule Take 4 mg by mouth at bedtime.   Yes [provider]  topiramate (TOPAMAX) 100 MG tablet Take 100 mg by mouth daily. At bedtime   Yes [provider]  ALPRAZolam Duanne Moron) 1 MG  tablet Take 1 tablet (1 mg total) by mouth as needed for anxiety. Patient not taking: Reported on 11/16/2016 10/17/16   Dohmeier, Asencion Partridge, MD     Allergies:     Allergies  Allergen Reactions  . Bee Venom Anaphylaxis  . Shellfish Allergy Anaphylaxis  . Codeine Nausea And Vomiting     Physical Exam:   Vitals  Blood pressure 130/80, pulse 82, temperature 98.9 F (37.2 C), temperature source Oral, resp. rate 18, height 5' 7"  (1.702 m), weight 93.2 kg, SpO2 96 %.  Physical Examination: General appearance - alert, ill appearing, and in no distress  Mental status - alert, oriented to person, place, and time,  Eyes - sclera anicteric Neck - supple, no JVD elevation , Chest - clear  to auscultation bilaterally, symmetrical air movement,  Heart - S1 and S2 normal, regular  Abdomen - soft, nontender, nondistended, no masses or organomegaly Neurological - screening mental status exam normal, neck supple without rigidity, cranial nerves II through XII intact, DTR's normal and symmetric Extremities - no pedal edema noted, intact peripheral pulses  Skin - warm, very dry     Data Review:    CBC Recent Labs  Lab 10/07/19 1013  WBC 5.9  HGB 15.3  HCT 44.2  PLT 189  MCV 92.5  MCH 32.0  MCHC 34.6  RDW 12.8  LYMPHSABS 1.0  MONOABS 0.7  EOSABS 0.0  BASOSABS 0.0   ------------------------------------------------------------------------------------------------------------------  Chemistries  Recent Labs  Lab 10/07/19 1013  NA 133*  K 2.1*  CL  100  CO2 19*  GLUCOSE 109*  BUN 34*  CREATININE 2.11*  CALCIUM 8.4*  AST 62*  ALT 36  ALKPHOS 83  BILITOT 0.7   ------------------------------------------------------------------------------------------------------------------ estimated creatinine clearance is 34.4 mL/min (A) (by C-G formula based on SCr of 2.11 mg/dL (H)). ------------------------------------------------------------------------------------------------------------------ No results for input(s): TSH, T4TOTAL, T3FREE, THYROIDAB in the last 72 hours.  Invalid input(s): FREET3   Coagulation profile Recent Labs  Lab 10/07/19 1040  INR 1.0   ------------------------------------------------------------------------------------------------------------------- Recent Labs    10/07/19 1013  DDIMER 6.87*    Cardiac Enzymes No results for input(s): CKMB, TROPONINI, MYOGLOBIN in the last 168 hours.  Invalid input(s): CK ------------------------------------------------------------------------------------------------------------------ No results found for: BNP   Urinalysis    Component Value Date/Time   COLORURINE YELLOW 10/07/2019 1013   APPEARANCEUR CLEAR 10/07/2019 1013   LABSPEC 1.015 10/07/2019 1013   LABSPEC 1.025 09/06/2006 1104   PHURINE 5.0 10/07/2019 1013   GLUCOSEU NEGATIVE 10/07/2019 1013   HGBUR NEGATIVE 10/07/2019 1013   [place] 10/07/2019 1013   BILIRUBINUR Negative 09/06/2006 1104   KETONESUR NEGATIVE 10/07/2019 1013   PROTEINUR 100 (A) 10/07/2019 1013   UROBILINOGEN 1.0 08/20/2008 1340   NITRITE NEGATIVE 10/07/2019 1013   LEUKOCYTESUR NEGATIVE 10/07/2019 1013   LEUKOCYTESUR Trace 09/06/2006 1104     Imaging Results:    CT Head Wo Contrast  Result Date: 10/07/2019 CLINICAL DATA:  Encephalopathy, VP shunt EXAM: CT HEAD WITHOUT CONTRAST TECHNIQUE: Contiguous axial images were obtained from the base of the skull through the vertex without intravenous contrast. COMPARISON:   01/06/2017 FINDINGS: Brain: Right frontal approach shunt catheter is new from the prior study and terminates near midline within the interhemispheric fissure. Right parietal approach catheter has been discontinued. Ventricle caliber is similar. There is no acute intracranial hemorrhage or mass effect. No new loss of gray differentiation. Nonspecific primarily periventricular white matter hypoattenuation. Vascular: No hyperdense vessel. Skull: Unremarkable apart from burr holes. Sinuses/Orbits: Patchy mucosal thickening  with chronic sphenoid sinusitis. Other: Mastoid air cells are clear. IMPRESSION: Interval placement of right frontal approach shunt catheter. Similar enlargement of the third and lateral ventricles. No acute intracranial hemorrhage, mass effect, or evidence of acute infarction. Electronically Signed   By: Macy Mis M.D.   On: 10/07/2019 11:16   DG Chest Port 1 View  Result Date: 10/07/2019 CLINICAL DATA:  EMS reports wife called because pt unable to get up off of couch. Reports when they arrived pt was soaked in urine. Reports recently exposed to someone with covid. Reports cough for " a while." EMS reports fever 101.8. EXAM: PORTABLE CHEST - 1 VIEW COMPARISON:  07/29/2019 FINDINGS: Lungs are clear. Heart size upper limits normal for technique. Tortuous thoracic aorta. No effusion. No pneumothorax. Stable cervical fixation hardware. VP shunt tubing projects over the right hemithorax. IMPRESSION: No acute disease. Electronically Signed   By: Lucrezia Europe M.D.   On: 10/07/2019 10:34    Radiological Exams on Admission: CT Head Wo Contrast  Result Date: 10/07/2019 CLINICAL DATA:  Encephalopathy, VP shunt EXAM: CT HEAD WITHOUT CONTRAST TECHNIQUE: Contiguous axial images were obtained from the base of the skull through the vertex without intravenous contrast. COMPARISON:  01/06/2017 FINDINGS: Brain: Right frontal approach shunt catheter is new from the prior study and terminates near midline  within the interhemispheric fissure. Right parietal approach catheter has been discontinued. Ventricle caliber is similar. There is no acute intracranial hemorrhage or mass effect. No new loss of gray differentiation. Nonspecific primarily periventricular white matter hypoattenuation. Vascular: No hyperdense vessel. Skull: Unremarkable apart from burr holes. Sinuses/Orbits: Patchy mucosal thickening with chronic sphenoid sinusitis. Other: Mastoid air cells are clear. IMPRESSION: Interval placement of right frontal approach shunt catheter. Similar enlargement of the third and lateral ventricles. No acute intracranial hemorrhage, mass effect, or evidence of acute infarction. Electronically Signed   By: Macy Mis M.D.   On: 10/07/2019 11:16   DG Chest Port 1 View  Result Date: 10/07/2019 CLINICAL DATA:  EMS reports wife called because pt unable to get up off of couch. Reports when they arrived pt was soaked in urine. Reports recently exposed to someone with covid. Reports cough for " a while." EMS reports fever 101.8. EXAM: PORTABLE CHEST - 1 VIEW COMPARISON:  07/29/2019 FINDINGS: Lungs are clear. Heart size upper limits normal for technique. Tortuous thoracic aorta. No effusion. No pneumothorax. Stable cervical fixation hardware. VP shunt tubing projects over the right hemithorax. IMPRESSION: No acute disease. Electronically Signed   By: Lucrezia Europe M.D.   On: 10/07/2019 10:34    DVT Prophylaxis -SCD/ AM Labs Ordered, also please review Full Orders  Family Communication: Admission, patients condition and plan of care including tests being ordered have been discussed with the patient who indicate understanding and agree with the plan   Code Status - Full Code  Likely DC to  Home with improvement in renal function over medical condition--- may need home health PT  Condition   stable  Roxan Hockey M.D on 10/07/2019 at 5:28 PM Go to [email or web address] -  for contact info  [facility] - Office   [phone]

## 2019-10-07 NOTE — Progress Notes (Signed)
CRITICAL VALUE ALERT  Critical Value: Potassium 2.6  Date & Time Notied:  10/07/2019, 0615  Provider Notified: Dr. Joesph Fillers  Orders Received/Actions taken: Continue to administer potassium chloride 10 mEq in 100 ml IVPB.

## 2019-10-07 NOTE — ED Triage Notes (Signed)
EMS reports wife called because pt unable to get up off of couch.  Reports when they arrived pt was soaked in urine.  Reports recently exposed to someone with covid.  Reports cough for " a while."  EMS reports fever 101.8.  ETC02 27-29, rr 24, o2 sat 98% on room air.  CBG 121.    Pt has vp shunt due to hydrocephalus.  REports has history of ptsd and gets tearful at times.

## 2019-10-07 NOTE — ED Provider Notes (Signed)
[facility] EMERGENCY DEPARTMENT Provider Note   CSN: [id number] Arrival date & time: 10/07/19  7510     History Chief Complaint  Patient presents with  . Fever    Albert Hahn is a 74 y.o. male.  Level 5 caveat for altered mental status.  Patient from home with generalized weakness.  EMS called because patient was unable to get off the couch.  EMS reports patient was soaked in urine on their arrival has been coughing.  He also has a known Covid exposure.  EMS reports tachypnea with fever but no hypoxia.  Patient denies any pain.  He is oriented to person and place.  He states his been sick for "1 month".  Says he always has a cough but does not feel short of breath.  Denies any headache, vomiting, chest pain, diarrhea.  No pain with urination or blood in the urine.  Medical history includes VP shunt, non-Hodgkin's lymphoma, PTSD .  The history is provided by the patient and the EMS personnel. The history is limited by the condition of the patient.       Past Medical History:  Diagnosis Date  . Anxiety   . Arthritis   . Complication of anesthesia    woke up during lymph node removal, facial surgery  . Depression   . H/O emphysema   . Headache(784.0)   . History of hiatal hernia    "doesnt bother me"  . Hypertension   . 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 PTSD (post-traumatic stress disorder)     Patient Active Problem List   Diagnosis Date Noted  . 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 Non Hodgkin's lymphoma ([place]) 12/29/2011    Past Surgical History:  Procedure Laterality Date  . BACK SURGERY     L3-L4 Fusion per pt  . COLONOSCOPY W/ POLYPECTOMY    . HERNIA REPAIR    . HIP SURGERY    . JOINT REPLACEMENT    . LUMBAR PUNCTURE  07/23/2012   severe headaches  . NECK SURGERY     metal plates and screws in neck   . TOTAL HIP ARTHROPLASTY    . VENTRICULOPERITONEAL SHUNT N/A 11/25/2016   Procedure: SHUNT INSERTION VENTRICULAR-PERITONEAL;  Surgeon: Eustace Moore,  MD;  Location: [place];  Service: Neurosurgery;  Laterality: N/A;       No family history on file.  Social History   Tobacco Use  . Smoking status: Former Research scientist (life sciences)  . Smokeless tobacco: Never Used  . Tobacco comment: Stopped smoking 29 years ago   Substance Use Topics  . Alcohol use: No    Alcohol/week: 1.0 standard drinks    Types: 1 Glasses of wine per week  . Drug use: No    Home Medications Prior to Admission medications   Medication Sig Start Date End Date Taking? Authorizing Provider  acetaminophen (TYLENOL) 500 MG tablet Take 500-1,000 mg by mouth every 6 (six) hours as needed for mild pain or moderate pain.    [provider]  ALPRAZolam Duanne Moron) 1 MG tablet Take 1 tablet (1 mg total) by mouth as needed for anxiety. Patient not taking: Reported on 11/16/2016 10/17/16   Dohmeier, Asencion Partridge, MD  buPROPion [facility] SR) 150 MG 12 hr tablet Take 150 mg by mouth 2 (two) times daily.    [provider]  diazepam (VALIUM) 5 MG tablet Take 5 mg by mouth every 8 (eight) hours as needed for anxiety.    [provider]  diphenhydrAMINE (BENADRYL) 25 mg capsule Take 25 mg by mouth 2 (two) times daily as needed (for allergies/sleep.).     [provider]  docusate sodium (COLACE) 100 MG capsule Take 500 mg by mouth daily.     [provider]  hydrochlorothiazide (HYDRODIURIL) 25 MG tablet Take 25 mg by mouth daily.    [provider]  oxyCODONE (OXY IR/ROXICODONE) 5 MG immediate release tablet Take 5 mg by mouth every 6 (six) hours as needed (for pain.).    [provider]  Polyethyl Glycol-Propyl Glycol (LUBRICANT EYE DROPS) 0.4-0.3 % SOLN Place 1-2 drops into both eyes daily.    [provider]  temazepam (RESTORIL) 15 MG capsule Take 15 mg by mouth at bedtime. sleep    [provider]  terazosin (HYTRIN) 2 MG capsule Take 4 mg by mouth at bedtime.    [provider]    Allergies    Bee venom, Shellfish  allergy, and Codeine  Review of Systems   Review of Systems  Constitutional: Positive for activity change, appetite change, fatigue and fever.  HENT: Positive for rhinorrhea. Negative for congestion.   Eyes: Negative for visual disturbance.  Respiratory: Positive for cough. Negative for chest tightness and shortness of breath.   Cardiovascular: Negative for chest pain.  Gastrointestinal: Negative for abdominal pain, nausea and vomiting.  Genitourinary: Negative for dysuria and hematuria.  Musculoskeletal: Positive for arthralgias and myalgias.  Neurological: Positive for weakness. Negative for dizziness and headaches.   all other systems are negative except as noted in the HPI and PMH.    Physical Exam Updated Vital Signs BP 140/84 (BP Location: Left Arm)   Pulse 97   Temp (!) 101.8 F (38.8 C) (Oral)   Resp (!) 28   Ht 5\' 7"  (1.702 m)   Wt 94.8 kg   SpO2 97%   BMI 32.73 kg/m   Physical Exam Vitals and nursing note reviewed.  Constitutional:      General: He is in acute distress.     Appearance: He is well-developed. He is ill-appearing.     Comments: Ill-appearing but nontoxic  HENT:     Head: Normocephalic and atraumatic.     Mouth/Throat:     Pharynx: No oropharyngeal exudate.  Eyes:     Conjunctiva/sclera: Conjunctivae normal.     Pupils: Pupils are equal, round, and reactive to light.  Neck:     Comments: No meningismus. VP shunt is in place.  There is no pain with range of motion of the neck. Cardiovascular:     Rate and Rhythm: Normal rate and regular rhythm.     Heart sounds: Normal heart sounds. No murmur.  Pulmonary:     Effort: Pulmonary effort is normal. No respiratory distress.     Breath sounds: Normal breath sounds.  Chest:     Chest wall: No tenderness.  Abdominal:     Palpations: Abdomen is soft.     Tenderness: There is no abdominal tenderness. There is no guarding or rebound.  Musculoskeletal:        General: No tenderness. Normal range of  motion.     Cervical back: Normal range of motion and neck supple.  Skin:    General: Skin is warm.  Neurological:     Mental Status: He is alert.     Cranial Nerves: No cranial nerve deficit.     Motor: No abnormal muscle tone.     Coordination: Coordination normal.  Comments: Slow to respond, 5/5 strength throughout, follows commands without difficulty. Moves all extremities.  Psychiatric:        Behavior: Behavior normal.     ED Results / Procedures / Treatments   Labs (all labs ordered are listed, but only abnormal results are displayed) Labs Reviewed  COMPREHENSIVE METABOLIC PANEL - Abnormal; Notable for the following components:      Result Value   Sodium 133 (*)    Potassium 2.1 (*)    CO2 19 (*)    Glucose, Bld 109 (*)    BUN 34 (*)    Creatinine, Ser 2.11 (*)    Calcium 8.4 (*)    AST 62 (*)    GFR calc non Af Amer 30 (*)    GFR calc Af Amer 35 (*)    All other components within normal limits  D-DIMER, QUANTITATIVE (NOT AT [facility]) - Abnormal; Notable for the following components:   D-Dimer, Quant 6.87 (*)    All other components within normal limits  LACTATE DEHYDROGENASE - Abnormal; Notable for the following components:   LDH 212 (*)    All other components within normal limits  FIBRINOGEN - Abnormal; Notable for the following components:   Fibrinogen 576 (*)    All other components within normal limits  C-REACTIVE PROTEIN - Abnormal; Notable for the following components:   CRP 9.2 (*)    All other components within normal limits  URINALYSIS, ROUTINE W REFLEX MICROSCOPIC - Abnormal; Notable for the following components:   Protein, ur 100 (*)    All other components within normal limits  POC SARS CORONAVIRUS 2 AG -  ED - Abnormal; Notable for the following components:   SARS Coronavirus 2 Ag POSITIVE (*)    All other components within normal limits  POC SARS CORONAVIRUS 2 AG -  ED - Abnormal; Notable for the following components:   SARS Coronavirus 2 Ag  POSITIVE (*)    All other components within normal limits  CULTURE, BLOOD (ROUTINE X 2)  CULTURE, BLOOD (ROUTINE X 2)  URINE CULTURE  LACTIC ACID, PLASMA  LACTIC ACID, PLASMA  CBC WITH DIFFERENTIAL/PLATELET  PROCALCITONIN  FERRITIN  TRIGLYCERIDES  APTT  PROTIME-INR  INFLUENZA PANEL BY PCR (TYPE A & B)  RENAL FUNCTION PANEL  CBC  CBC WITH DIFFERENTIAL/PLATELET  COMPREHENSIVE METABOLIC PANEL  C-REACTIVE PROTEIN  D-DIMER, QUANTITATIVE (NOT AT [facility])  FERRITIN  MAGNESIUM  PHOSPHORUS  ABO/RH    EKG None  Radiology CT Head Wo Contrast  Result Date: 10/07/2019 CLINICAL DATA:  Encephalopathy, VP shunt EXAM: CT HEAD WITHOUT CONTRAST TECHNIQUE: Contiguous axial images were obtained from the base of the skull through the vertex without intravenous contrast. COMPARISON:  01/06/2017 FINDINGS: Brain: Right frontal approach shunt catheter is new from the prior study and terminates near midline within the interhemispheric fissure. Right parietal approach catheter has been discontinued. Ventricle caliber is similar. There is no acute intracranial hemorrhage or mass effect. No new loss of gray differentiation. Nonspecific primarily periventricular white matter hypoattenuation. Vascular: No hyperdense vessel. Skull: Unremarkable apart from burr holes. Sinuses/Orbits: Patchy mucosal thickening with chronic sphenoid sinusitis. Other: Mastoid air cells are clear. IMPRESSION: Interval placement of right frontal approach shunt catheter. Similar enlargement of the third and lateral ventricles. No acute intracranial hemorrhage, mass effect, or evidence of acute infarction. Electronically Signed   By: Macy Mis M.D.   On: 10/07/2019 11:16   DG Chest Port 1 View  Result Date: 10/07/2019 CLINICAL DATA:  EMS reports  wife called because pt unable to get up off of couch. Reports when they arrived pt was soaked in urine. Reports recently exposed to someone with covid. Reports cough for " a while." EMS reports  fever 101.8. EXAM: PORTABLE CHEST - 1 VIEW COMPARISON:  07/29/2019 FINDINGS: Lungs are clear. Heart size upper limits normal for technique. Tortuous thoracic aorta. No effusion. No pneumothorax. Stable cervical fixation hardware. VP shunt tubing projects over the right hemithorax. IMPRESSION: No acute disease. Electronically Signed   By: Lucrezia Europe M.D.   On: 10/07/2019 10:34    Procedures .Critical Care Performed by: Ezequiel Essex, MD Authorized by: Ezequiel Essex, MD   Critical care provider statement:    Critical care time (minutes):  35   Critical care was necessary to treat or prevent imminent or life-threatening deterioration of the following conditions:  Dehydration and metabolic crisis   Critical care was time spent personally by me on the following activities:  Discussions with consultants, evaluation of patient's response to treatment, examination of patient, ordering and performing treatments and interventions, ordering and review of laboratory studies, ordering and review of radiographic studies, pulse oximetry, re-evaluation of patient's condition, obtaining history from patient or surrogate and review of old charts   (including critical care time)  Medications Ordered in ED Medications  potassium chloride 10 mEq in 100 mL IVPB (10 mEq Intravenous New Bag/Given 10/07/19 1712)  potassium chloride SA (KLOR-CON) CR tablet 40 mEq (40 mEq Oral Given 10/07/19 1551)  ceFEPIme (MAXIPIME) 2 g in sodium chloride 0.9 % 100 mL IVPB (has no administration in time range)  0.9 % NaCl with KCl 40 mEq / L  infusion (125 mL/hr Intravenous New Bag/Given 10/07/19 1545)  vancomycin (VANCOREADY) IVPB 1250 mg/250 mL (has no administration in time range)  temazepam (RESTORIL) capsule 15 mg (has no administration in time range)  acetaminophen (TYLENOL) tablet 500-1,000 mg (has no administration in time range)  terazosin (HYTRIN) capsule 4 mg (has no administration in time range)  oxyCODONE (Oxy  IR/ROXICODONE) immediate release tablet 5 mg (has no administration in time range)  polyvinyl alcohol (LIQUIFILM TEARS) 1.4 % ophthalmic solution 1-2 drop (has no administration in time range)  amitriptyline (ELAVIL) tablet 50 mg (has no administration in time range)  topiramate (TOPAMAX) tablet 100 mg (100 mg Oral Given 10/07/19 1551)  amLODipine (NORVASC) tablet 10 mg (has no administration in time range)  sodium chloride flush (NS) 0.9 % injection 3 mL (3 mLs Intravenous Given 10/07/19 1552)  sodium chloride flush (NS) 0.9 % injection 3 mL (has no administration in time range)  0.9 %  sodium chloride infusion (has no administration in time range)  traZODone (DESYREL) tablet 50 mg (has no administration in time range)  polyethylene glycol (MIRALAX / GLYCOLAX) packet 17 g (has no administration in time range)  ondansetron (ZOFRAN) tablet 4 mg (has no administration in time range)    Or  ondansetron (ZOFRAN) injection 4 mg (has no administration in time range)  heparin injection 5,000 Units (has no administration in time range)  guaiFENesin-dextromethorphan (ROBITUSSIN DM) 100-10 MG/5ML syrup 10 mL (has no administration in time range)  chlorpheniramine-HYDROcodone (TUSSIONEX) 10-8 MG/5ML suspension 5 mL (has no administration in time range)  ascorbic acid (VITAMIN C) tablet 500 mg (500 mg Oral Given 10/07/19 1551)  zinc sulfate capsule 220 mg (220 mg Oral Given 02/10/35 6294)  folic acid (FOLVITE) tablet 1 mg (1 mg Oral Given 10/07/19 1551)  multivitamin with minerals tablet 1 tablet (1 tablet Oral Given  10/07/19 1551)  thiamine tablet 100 mg (100 mg Oral Given 10/07/19 1551)  ceFEPIme (MAXIPIME) 2 g in sodium chloride 0.9 % 100 mL IVPB (2 g Intravenous New Bag/Given 10/07/19 1134)  metroNIDAZOLE (FLAGYL) IVPB 500 mg (500 mg Intravenous New Bag/Given 10/07/19 1240)  vancomycin (VANCOREADY) IVPB 2000 mg/400 mL (2,000 mg Intravenous New Bag/Given 10/07/19 1345)  acetaminophen (TYLENOL) tablet 650 mg  (650 mg Oral Given 10/07/19 1133)  sodium chloride 0.9 % bolus 1,000 mL (1,000 mLs Intravenous New Bag/Given 10/07/19 1318)    ED Course  I have reviewed the triage vital signs and the nursing notes.  Pertinent labs & imaging results that were available during my care of the patient were reviewed by me and considered in my medical decision making (see chart for details).    MDM Rules/Calculators/A&P                      Patient from home with several days of weakness, coughing, fever, fatigue. Febrile on arrival with tachypnea.  Concern for covid or other sepsis.  Gentle IVF, broad spectrum antibiotics after cultures obtained.   COVID+. CT head with stable VP shunt. CXR clear.   Labs with hypokalemia and AKI.   D/w patient's wife. He has been generally weak for several days and she is concerned that "his cancer (lymphoma) could be back." discussed that such evaluation does not take place in the ED, but his weakness can be explained by his COVID infection and hypokalemia.   With his severe hypokalemia and AKI, plan admission. D/w Dr. Denton Brick.  No steroids as no oxygen requirement. Will order remdisivir.   Albert Hahn was evaluated in Emergency Department on 10/07/2019 for the symptoms described in the history of present illness. He was evaluated in the context of the global COVID-19 pandemic, which necessitated consideration that the patient might be at risk for infection with the SARS-CoV-2 virus that causes COVID-19. Institutional protocols and algorithms that pertain to the evaluation of patients at risk for COVID-19 are in a state of rapid change based on information released by regulatory bodies including the CDC and federal and state organizations. These policies and algorithms were followed during the patient's care in the ED.  Final Clinical Impression(s) / ED Diagnoses Final diagnoses:  COVID-19 virus infection  AKI (acute kidney injury) ([place])  Hypokalemia    Rx / DC  Orders ED Discharge Orders    None       Dashae Wilcher, Annie Main, MD 10/07/19 1720

## 2019-10-07 NOTE — ED Notes (Signed)
Date and time results received: 10/07/2019, 1220 (use smartphrase ".now" to insert current time)  Test: K Critical Value: 2.1  Name of Provider Notified: Dr. Wyvonnia Dusky  Orders Received? Or Actions Taken?: See chart

## 2019-10-08 ENCOUNTER — Inpatient Hospital Stay (HOSPITAL_COMMUNITY): Payer: No Typology Code available for payment source

## 2019-10-08 DIAGNOSIS — R6510 Systemic inflammatory response syndrome (SIRS) of non-infectious origin without acute organ dysfunction: Secondary | ICD-10-CM

## 2019-10-08 DIAGNOSIS — E876 Hypokalemia: Secondary | ICD-10-CM

## 2019-10-08 DIAGNOSIS — N179 Acute kidney failure, unspecified: Secondary | ICD-10-CM

## 2019-10-08 LAB — CBC WITH DIFFERENTIAL/PLATELET
Abs Immature Granulocytes: 0.02 10*3/uL (ref 0.00–0.07)
Basophils Absolute: 0 10*3/uL (ref 0.0–0.1)
Basophils Relative: 0 %
Eosinophils Absolute: 0 10*3/uL (ref 0.0–0.5)
Eosinophils Relative: 0 %
HCT: 39.6 % (ref 39.0–52.0)
Hemoglobin: 13.5 g/dL (ref 13.0–17.0)
Immature Granulocytes: 0 %
Lymphocytes Relative: 16 %
Lymphs Abs: 1 10*3/uL (ref 0.7–4.0)
MCH: 32.4 pg (ref 26.0–34.0)
MCHC: 34.1 g/dL (ref 30.0–36.0)
MCV: 95 fL (ref 80.0–100.0)
Monocytes Absolute: 0.5 10*3/uL (ref 0.1–1.0)
Monocytes Relative: 8 %
Neutro Abs: 4.6 10*3/uL (ref 1.7–7.7)
Neutrophils Relative %: 76 %
Platelets: 170 10*3/uL (ref 150–400)
RBC: 4.17 MIL/uL — ABNORMAL LOW (ref 4.22–5.81)
RDW: 12.9 % (ref 11.5–15.5)
WBC: 6 10*3/uL (ref 4.0–10.5)
nRBC: 0 % (ref 0.0–0.2)

## 2019-10-08 LAB — URINE CULTURE: Culture: NO GROWTH

## 2019-10-08 LAB — COMPREHENSIVE METABOLIC PANEL
ALT: 34 U/L (ref 0–44)
AST: 50 U/L — ABNORMAL HIGH (ref 15–41)
Albumin: 3.1 g/dL — ABNORMAL LOW (ref 3.5–5.0)
Alkaline Phosphatase: 72 U/L (ref 38–126)
Anion gap: 10 (ref 5–15)
BUN: 36 mg/dL — ABNORMAL HIGH (ref 8–23)
CO2: 20 mmol/L — ABNORMAL LOW (ref 22–32)
Calcium: 8.2 mg/dL — ABNORMAL LOW (ref 8.9–10.3)
Chloride: 108 mmol/L (ref 98–111)
Creatinine, Ser: 1.87 mg/dL — ABNORMAL HIGH (ref 0.61–1.24)
GFR calc Af Amer: 41 mL/min — ABNORMAL LOW (ref >60)
GFR calc non Af Amer: 35 mL/min — ABNORMAL LOW (ref >60)
Glucose, Bld: 129 mg/dL — ABNORMAL HIGH (ref 70–99)
Potassium: 3.5 mmol/L (ref 3.5–5.1)
Sodium: 138 mmol/L (ref 135–145)
Total Bilirubin: 0.5 mg/dL (ref 0.3–1.2)
Total Protein: 6.4 g/dL — ABNORMAL LOW (ref 6.5–8.1)

## 2019-10-08 LAB — FERRITIN: Ferritin: 265 ng/mL (ref 24–336)

## 2019-10-08 LAB — D-DIMER, QUANTITATIVE: D-Dimer, Quant: 5.01 ug/mL-FEU — ABNORMAL HIGH (ref 0.00–0.50)

## 2019-10-08 LAB — MAGNESIUM: Magnesium: 2.2 mg/dL (ref 1.7–2.4)

## 2019-10-08 LAB — C-REACTIVE PROTEIN: CRP: 8.1 mg/dL — ABNORMAL HIGH (ref <1.0)

## 2019-10-08 LAB — PHOSPHORUS: Phosphorus: 2.5 mg/dL (ref 2.5–4.6)

## 2019-10-08 MED ORDER — DEXAMETHASONE 4 MG PO TABS
6.0000 mg | ORAL_TABLET | Freq: Every day | ORAL | Status: DC
  Administered 2019-10-09 – 2019-10-13 (×5): 6 mg via ORAL
  Filled 2019-10-08 (×5): qty 2

## 2019-10-08 MED ORDER — POTASSIUM CHLORIDE IN NACL 40-0.9 MEQ/L-% IV SOLN
INTRAVENOUS | Status: AC
  Administered 2019-10-08: 100 mL/h via INTRAVENOUS

## 2019-10-08 MED ORDER — TEMAZEPAM 15 MG PO CAPS
15.0000 mg | ORAL_CAPSULE | Freq: Every evening | ORAL | Status: DC | PRN
  Administered 2019-10-10: 15 mg via ORAL
  Filled 2019-10-08: qty 1

## 2019-10-08 NOTE — Plan of Care (Signed)
  Problem: Acute Rehab PT Goals(only PT should resolve) Goal: Pt Will Go Supine/Side To Sit Outcome: Progressing Flowsheets (Taken 10/08/2019 1024) Pt will go Supine/Side to Sit:  with min guard assist  with minimal assist Goal: Patient Will Transfer Sit To/From Stand Outcome: Progressing Flowsheets (Taken 10/08/2019 1024) Patient will transfer sit to/from stand:  with minimal assist  with min guard assist Goal: Pt Will Transfer Bed To Chair/Chair To Bed Outcome: Progressing Flowsheets (Taken 10/08/2019 1024) Pt will Transfer Bed to Chair/Chair to Bed:  min guard assist  with min assist Goal: Pt Will Ambulate Outcome: Progressing Flowsheets (Taken 10/08/2019 1024) Pt will Ambulate:  50 feet  with minimal assist  with rolling walker   10:25 AM, 10/08/19 Lonell Grandchild, MPT Physical Therapist with [facility] [phone] office [phone] mobile phone

## 2019-10-08 NOTE — Plan of Care (Signed)

## 2019-10-08 NOTE — TOC Initial Note (Signed)
Transition of Care [facility] - Initial/Assessment Note    Patient Details  Name: Albert Hahn MRN: [id number] Date of Birth: 12-06-1945  [facility]) CM/SW Contact:    Ihor Gully, LCSW Phone Number: 10/08/2019, 2:45 PM  Clinical Narrative:                 Patient from home with spouse. COVID+. At baseline he is independent. He and his wife walked a mile a day prior to the week of 2/14. Discussed PT recommendation of SNF. Discussed facilities accepting SNF patients. Spouse spoke with patient's children and sister and is agreeable to SNF. She became tearful as she has been unable to be with patient during hospitalizations. Mrs. Agcaoili advised that her, all of their children, grandchildren are COVID+.  Expected Discharge Plan: Skilled Nursing Facility Barriers to Discharge: Continued Medical Work up   Patient Goals and CMS Choice Patient states their goals for this hospitalization and ongoing recovery are:: Spouse wants him to get stronger.   Choice offered to / list presented to : Spouse  Expected Discharge Plan and Services Expected Discharge Plan: [place]       Living arrangements for the past 2 months: Single Family Home                                      Prior Living Arrangements/Services Living arrangements for the past 2 months: Single Family Home Lives with:: Spouse Patient language and need for interpreter reviewed:: Yes Do you feel safe going back to the place where you live?: Yes      Need for Family Participation in Patient Care: Yes (Comment) Care giver support system in place?: Yes (comment)   Criminal Activity/Legal Involvement Pertinent to Current Situation/Hospitalization: No - Comment as needed  Activities of Daily Living Home Assistive Devices/Equipment: None ADL Screening (condition at time of admission) Patient's cognitive ability adequate to safely complete daily activities?: Yes Is the patient deaf or have  difficulty hearing?: No Does the patient have difficulty seeing, even when wearing glasses/contacts?: No Does the patient have difficulty concentrating, remembering, or making decisions?: No Patient able to express need for assistance with ADLs?: Yes Does the patient have difficulty dressing or bathing?: No Independently performs ADLs?: Yes (appropriate for developmental age) Does the patient have difficulty walking or climbing stairs?: Yes Weakness of Legs: Both Weakness of Arms/Hands: Both  Permission Sought/Granted Permission sought to share information with : Family Supports          Permission granted to share info w Relationship: spouse, Mrs. Lokken     Emotional Assessment Appearance:: Appears stated age   Affect (typically observed): Unable to Assess Orientation: : Oriented to Self, Oriented to Place Alcohol / Substance Use: Not Applicable    Admission diagnosis:  Hypokalemia [E87.6] AKI (acute kidney injury) ([place]) [N17.9] COVID-19 virus infection [U07.1] Acute respiratory disease due to COVID-19 virus [U07.1, J06.9] Patient Active Problem List   Diagnosis Date Noted  . Acute respiratory disease due to COVID-19 virus 10/07/2019  . Acute hypokalemia 10/07/2019  . SIRS (systemic inflammatory response syndrome) ([place]) 10/07/2019  . COVID-19 virus infection 10/07/2019  . 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 Non Hodgkin's lymphoma ([place]) 12/29/2011   PCP:  Asencion Noble, MD Pharmacy:   [facility] [address] [facility] [place]  [place] [address] Phone: [phone] Fax: [phone]     Social Determinants of Health (SDOH) Interventions    Readmission Risk Interventions No flowsheet data found.

## 2019-10-08 NOTE — NC FL2 (Signed)
[facility] LEVEL OF CARE SCREENING TOOL     IDENTIFICATION  Patient Name: Albert Hahn Birthdate: 1946/07/20 Sex: male Admission Date (Current Location): 10/07/2019  [facility] and [place] Number:  [employer] and Address:  [place] [address] [place]      Provider Number: [id number]  Attending Physician Name and Address:  Barton Dubois, MD  Relative Name and Phone Number:       Current Level of Care: Hospital Recommended Level of Care: [place] Prior Approval Number:    Date Approved/Denied:   PASRR Number: [id number]  Discharge Plan: SNF    Current Diagnoses: Patient Active Problem List   Diagnosis Date Noted  . Acute respiratory disease due to COVID-19 virus 10/07/2019  . Acute hypokalemia 10/07/2019  . SIRS (systemic inflammatory response syndrome) ([place]) 10/07/2019  . COVID-19 virus infection 10/07/2019  . 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 Non Hodgkin's lymphoma ([place]) 12/29/2011    Orientation RESPIRATION BLADDER Height & Weight     Self, Place  Normal Continent Weight: 205 lb 7.5 oz (93.2 kg) Height:  5\' 7"  (170.2 cm)  BEHAVIORAL SYMPTOMS/MOOD NEUROLOGICAL BOWEL NUTRITION STATUS      Continent Diet(regular)  AMBULATORY STATUS COMMUNICATION OF NEEDS Skin   Limited Assist Verbally PU Stage and Appropriate Care(buttocks, right)                       Personal Care Assistance Level of Assistance  Bathing, Feeding, Dressing Bathing Assistance: Limited assistance Feeding assistance: Independent Dressing Assistance: Limited assistance     Functional Limitations Info  Sight, Speech, Hearing Sight Info: Adequate Hearing Info: Impaired(Hard of hearing, wears hearing aids) Speech Info: Adequate    SPECIAL CARE FACTORS FREQUENCY  PT (By licensed PT)     PT Frequency: 5x/week              Contractures Contractures Info: Not present    Additional Factors Info  Code Status,  Allergies, Psychotropic, Isolation Precautions Code Status Info: Full Code Allergies Info: Bee Venom, Shellfish Allergy, Codeine Psychotropic Info: Xanax   Isolation Precautions Info: COVID+ 10/07/19     Current Medications (10/08/2019):  This is the current hospital active medication list Current Facility-Administered Medications  Medication Dose Route Frequency Provider Last Rate Last Admin  . 0.9 %  sodium chloride infusion  250 mL Intravenous PRN Emokpae, Courage, MD      . 0.9 % NaCl with KCl 40 mEq / L  infusion   Intravenous Continuous Emokpae, Courage, MD 125 mL/hr at 10/08/19 1310 125 mL/hr at 10/08/19 1310  . acetaminophen (TYLENOL) tablet 500-1,000 mg  500-1,000 mg Oral Q6H PRN Roxan Hockey, MD   1,000 mg at 10/08/19 0508  . amitriptyline (ELAVIL) tablet 50 mg  50 mg Oral QHS Emokpae, Courage, MD   50 mg at 10/07/19 2041  . amLODipine (NORVASC) tablet 10 mg  10 mg Oral Daily Denton Brick, Courage, MD   10 mg at 10/08/19 0926  . ascorbic acid (VITAMIN C) tablet 500 mg  500 mg Oral Daily Emokpae, Courage, MD   500 mg at 10/08/19 0927  . chlorpheniramine-HYDROcodone (TUSSIONEX) 10-8 MG/5ML suspension 5 mL  5 mL Oral Q12H PRN Emokpae, Courage, MD      . folic acid (FOLVITE) tablet 1 mg  1 mg Oral Daily Emokpae, Courage, MD   1 mg at 10/08/19 0926  . guaiFENesin-dextromethorphan (ROBITUSSIN DM) 100-10 MG/5ML syrup 10 mL  10 mL  Oral Q4H PRN Roxan Hockey, MD      . heparin injection 5,000 Units  5,000 Units Subcutaneous Q8H Emokpae, Courage, MD   5,000 Units at 10/08/19 1311  . multivitamin with minerals tablet 1 tablet  1 tablet Oral Daily Roxan Hockey, MD   1 tablet at 10/08/19 0926  . ondansetron (ZOFRAN) tablet 4 mg  4 mg Oral Q6H PRN Emokpae, Courage, MD       Or  . ondansetron (ZOFRAN) injection 4 mg  4 mg Intravenous Q6H PRN Emokpae, Courage, MD      . oxyCODONE (Oxy IR/ROXICODONE) immediate release tablet 5 mg  5 mg Oral Q6H PRN Emokpae, Courage, MD      . polyethylene  glycol (MIRALAX / GLYCOLAX) packet 17 g  17 g Oral Daily Emokpae, Courage, MD   17 g at 10/08/19 0927  . polyvinyl alcohol (LIQUIFILM TEARS) 1.4 % ophthalmic solution 1-2 drop  1-2 drop Both Eyes Daily Emokpae, Courage, MD   2 drop at 10/08/19 1146  . senna-docusate (Senokot-S) tablet 2 tablet  2 tablet Oral QHS Roxan Hockey, MD   2 tablet at 10/07/19 2042  . sodium chloride flush (NS) 0.9 % injection 3 mL  3 mL Intravenous Q12H Emokpae, Courage, MD   3 mL at 10/08/19 0928  . sodium chloride flush (NS) 0.9 % injection 3 mL  3 mL Intravenous PRN Emokpae, Courage, MD      . temazepam (RESTORIL) capsule 15 mg  15 mg Oral QHS PRN Barton Dubois, MD      . terazosin (HYTRIN) capsule 4 mg  4 mg Oral QHS Emokpae, Courage, MD   4 mg at 10/07/19 2041  . thiamine tablet 100 mg  100 mg Oral Daily Emokpae, Courage, MD   100 mg at 10/08/19 0926  . topiramate (TOPAMAX) tablet 100 mg  100 mg Oral Daily Emokpae, Courage, MD   100 mg at 10/08/19 7711  . zinc sulfate capsule 220 mg  220 mg Oral Daily Roxan Hockey, MD   220 mg at 10/08/19 6579     Discharge Medications: Please see discharge summary for a list of discharge medications.  Relevant Imaging Results:  Relevant Lab Results:   Additional Information SSN [id number], Clydene Pugh, LCSW

## 2019-10-08 NOTE — Evaluation (Signed)
Physical Therapy Evaluation Patient Details Name: Albert Hahn MRN: [id number] DOB: 21-Mar-1946 Today's Date: 10/08/2019   History of Present Illness  Albert Hahn  is a 74 y.o. male with past medical history relevant for history of VP shunt, PTSD, HTN, with recent COVID-19 exposure who presents to the ED with weakness, fatigue, fevers, tachypnea without hypoxia    Clinical Impression  Patient demonstrates slow labored movement for sitting up at bedside, unable to stand without AD due to BLE weakness, very unsteady on feet and limited to ambulation in room due to poor standing balance and fall risk.  Patient tolerated sitting up in chair after therapy - RN notified.  Patient will benefit from continued physical therapy in hospital and recommended venue below to increase strength, balance, endurance for safe ADLs and gait.     Follow Up Recommendations SNF    Equipment Recommendations  None recommended by PT    Recommendations for Other Services       Precautions / Restrictions Precautions Precautions: Fall Restrictions Weight Bearing Restrictions: No      Mobility  Bed Mobility Overal bed mobility: Needs Assistance Bed Mobility: Supine to Sit     Supine to sit: Min assist;Mod assist     General bed mobility comments: slow labored movement  Transfers Overall transfer level: Needs assistance Equipment used: Rolling walker (2 wheeled) Transfers: Sit to/from [facility] Sit to Stand: Min assist;Mod assist Stand pivot transfers: Min assist;Mod assist       General transfer comment: unable to stand without use of AD due to BLE weakness, required use of RW  Ambulation/Gait Ambulation/Gait assistance: Min assist;Mod assist Gait Distance (Feet): 20 Feet Assistive device: Rolling walker (2 wheeled) Gait Pattern/deviations: Decreased step length - right;Decreased step length - left;Decreased stride length;Wide base of support Gait velocity: decreased    General Gait Details: slow labored cadence with decreased step/stride length, unsteady and limited due to fatigue  Stairs            Wheelchair Mobility    Modified Rankin (Stroke Patients Only)       Balance Overall balance assessment: Needs assistance Sitting-balance support: Feet supported;No upper extremity supported Sitting balance-Leahy Scale: Fair Sitting balance - Comments: fair/good seated at bedside   Standing balance support: During functional activity;Bilateral upper extremity supported Standing balance-Leahy Scale: Poor Standing balance comment: fair/poor using RW                             Pertinent Vitals/Pain Pain Assessment: No/denies pain    Home Living Family/patient expects to be discharged to:: Private residence Living Arrangements: Spouse/significant other Available Help at Discharge: Family;Available 24 hours/day Type of Home: House Home Access: Stairs to enter Entrance Stairs-Rails: Right Entrance Stairs-Number of Steps: 4 Home Layout: One level Home Equipment: Walker - 2 wheels;Cane - single point;Shower seat;Bedside commode;Wheelchair - manual;Hospital bed      Prior Function Level of Independence: Independent         Comments: community ambulator, drives     Hand Dominance   Dominant Hand: Right    Extremity/Trunk Assessment   Upper Extremity Assessment Upper Extremity Assessment: Generalized weakness    Lower Extremity Assessment Lower Extremity Assessment: Generalized weakness    Cervical / Trunk Assessment Cervical / Trunk Assessment: Normal  Communication   Communication: No difficulties  Cognition Arousal/Alertness: Awake/alert Behavior During Therapy: WFL for tasks assessed/performed Overall Cognitive Status: Within Functional Limits for tasks assessed  General Comments      Exercises     Assessment/Plan    PT Assessment Patient needs  continued PT services  PT Problem List Decreased strength;Decreased activity tolerance;Decreased mobility;Decreased balance       PT Treatment Interventions Balance training;Gait training;Stair training;Functional mobility training;Therapeutic activities;Therapeutic exercise;Patient/family education    PT Goals (Current goals can be found in the Care Plan section)  Acute Rehab PT Goals Patient Stated Goal: return home with family to assist PT Goal Formulation: With patient Time For Goal Achievement: 10/22/19 Potential to Achieve Goals: Good    Frequency Min 3X/week   Barriers to discharge        Co-evaluation               AM-PAC PT "6 Clicks" Mobility  Outcome Measure Help needed turning from your back to your side while in a flat bed without using bedrails?: A Little Help needed moving from lying on your back to sitting on the side of a flat bed without using bedrails?: A Lot Help needed moving to and from a bed to a chair (including a wheelchair)?: A Lot Help needed standing up from a chair using your arms (e.g., wheelchair or bedside chair)?: A Lot Help needed to walk in hospital room?: A Lot Help needed climbing 3-5 steps with a railing? : A Lot 6 Click Score: 13    End of Session Equipment Utilized During Treatment: Gait belt Activity Tolerance: Patient tolerated treatment well;Patient limited by fatigue Patient left: in chair;with call bell/phone within reach;with chair alarm set Nurse Communication: Mobility status PT Visit Diagnosis: Unsteadiness on feet (R26.81);Other abnormalities of gait and mobility (R26.89);Muscle weakness (generalized) (M62.81)    Time: 3817-7116 PT Time Calculation (min) (ACUTE ONLY): 33 min   Charges:   PT Evaluation $PT Eval Moderate Complexity: 1 Mod PT Treatments $Therapeutic Activity: 23-37 mins        10:23 AM, 10/08/19 Lonell Grandchild, MPT Physical Therapist with [facility] [phone]  office [phone] mobile phone

## 2019-10-08 NOTE — Progress Notes (Signed)
PROGRESS NOTE    Albert Hahn  KJI:[id number] DOB: [birth year] DOA: 10/07/2019 PCP: Asencion Noble, MD     Brief Narrative:  As per H&P written by Dr. Denton Brick on 10/07/2019 74 y.o. male with past medical history relevant for history of VP shunt, PTSD, HTN, with recent COVID-19 exposure who presents to the ED with weakness, fatigue, fevers, tachypnea without hypoxia  In Ed Chest x-ray without acute findings, UA not consistent with UTI, WBCs only 5.9, lactic acid is 0.8 -Procalcitonin 0.13--- , -Influenza negative -CRP is 9.2, D-dimer 6.87 (No hypoxia), LDH elevated at 212, fibrinogen elevated at 576, -Ferritin 238, triglycerides 92,  -Creatinine is 2.1, potassium is also 2.1, serum ANCA pending  No productive cough, no vomiting or diarrhea  --Patient apparently got his first dose of the COVID-19 vaccine few weeks ago, he was due for the second dose of COVID-19 vaccine on Saturday, 10/12/2019.  Assessment & Plan: 1-SIRS/COVID-19 virus infection -Significant inflammatory markers elevation on presentation -No hypoxic, but with multiple risk factors for decompensation; no infiltrates appreciated bilaterally on chest x-ray  -Continue holding on remdesivir, but start treatment with Decadron 6 mg for 10 days. -Continue as needed antipyretics, IV fluids, as needed antiemetics, IV fluids and supportive care. -Follow clinical response. -Procalcitonin 0.13 and no frank source of bacterial infection appreciated.  Will discontinue antibiotics.  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 -Avoid hypotension  3-Acute hypokalemia -In the setting of poor oral intake and GI losses -Continue electrolytes repletion and follow trend.  4-anxiety disorder/depression and PTSD -Continue as needed Xanax, Topamax and as needed Restoril  at bedtime. -Mood is a stable.  5-history of BPH -No urinary retention or obstruction appreciated, no -Continue Hytrin.  6-hypertension -Stable -Continue amlodipine -Continue holding diuretics and follow VS  DVT prophylaxis: Heparin Code Status: Full code Family Communication: No family at bedside. Disposition Plan: Remains inpatient, start treatment with Decadron, continue IV fluids, follow electrolytes and renal function.  Continue supportive care.  No meeting criteria for remdesivir protocol.  Inflammatory markers trending down.  Consultants:   None  Procedures:   None for x-ray reports  -Lower extremity Dopplers: Negative for DVT.  Antimicrobials:  Anti-infectives (From admission, onward)   Start     Dose/Rate Route Frequency Ordered Stop   10/08/19 1400  vancomycin (VANCOREADY) IVPB 1250 mg/250 mL  Status:  Discontinued     1,250 mg 166.7 mL/hr over 90 Minutes Intravenous Every 24 hours 10/07/19 1356 10/08/19 0809   10/08/19 1000  remdesivir 100 mg in sodium chloride 0.9 % 100 mL IVPB  Status:  Discontinued     100 mg 200 mL/hr over 30 Minutes Intravenous Daily 10/07/19 1301 10/07/19 1314   10/08/19 0000  ceFEPIme (MAXIPIME) 2 g in sodium chloride 0.9 % 100 mL IVPB  Status:  Discontinued     2 g 200 mL/hr over 30 Minutes Intravenous Every 12 hours 10/07/19 1312 10/08/19 0809   10/07/19 1330  remdesivir 200 mg in sodium chloride 0.9% 250 mL IVPB  Status:  Discontinued     200 mg 580 mL/hr over 30 Minutes Intravenous Once 10/07/19 1301 10/07/19 1314   10/07/19 1200  vancomycin (VANCOREADY) IVPB 2000 mg/400 mL     2,000 mg 200 mL/hr over 120 Minutes Intravenous  Once 10/07/19 1048 10/08/19 0016   10/07/19 1045  ceFEPIme (MAXIPIME) 2 g in sodium chloride 0.9 %  100 mL IVPB     2 g 200 mL/hr over 30 Minutes Intravenous  Once 10/07/19 1040 10/07/19 2043   10/07/19 1045  metroNIDAZOLE (FLAGYL) IVPB 500 mg     500 mg 100 mL/hr over 60 Minutes Intravenous  Once 10/07/19  1040 10/07/19 2043   10/07/19 1045  vancomycin (VANCOCIN) IVPB 1000 mg/200 mL premix  Status:  Discontinued     1,000 mg 200 mL/hr over 60 Minutes Intravenous  Once 10/07/19 1040 10/07/19 1048       Subjective: Spiking fever, no chest pain, no nausea, no vomiting, no abdominal pain.  Patient reports intermittent coughing spells and general malaise.  Objective: Vitals:   10/07/19 2038 10/08/19 0506 10/08/19 0600 10/08/19 1404  BP: 131/79 (!) 142/75 (!) 142/75 (!) 151/88  Pulse: 72 80 80 88  Resp: 20 (!) 22 20 (!) 22  Temp: 98.4 F (36.9 C) (!) 100.7 F (38.2 C) 99.2 F (37.3 C) 100.3 F (37.9 C)  TempSrc: Oral Oral  Oral  SpO2: 94% 98% 98% 98%  Weight:      Height:        Intake/Output Summary (Last 24 hours) at 10/08/2019 1717 Last data filed at 10/08/2019 1500 Gross per 24 hour  Intake 3304.95 ml  Output 1250 ml  Net 2054.95 ml   Filed Weights   10/07/19 1006 10/07/19 1500  Weight: 94.8 kg 93.2 kg    Examination: General exam: Alert, awake cooperative with examination; patient is a spiking fever, complaining of intermittent coughing spells and general malaise.  Currently he is not hypoxic and repeat x-ray demonstrated no infiltrates.  Inflammatory markers remains elevated (even started to trend down).   Respiratory system: Good air movement bilaterally; no wheezing, no crackles.  No using accessory muscles. Respiratory effort normal. Cardiovascular system:RRR. No murmurs, rubs, gallops. Gastrointestinal system: Abdomen is nondistended, soft and nontender. No organomegaly or masses felt. Normal bowel sounds heard. Central nervous system: Alert and oriented. No focal neurological deficits. Extremities: No cyanosis or clubbing; TED hoses in place, 1+ edema bilaterally. Skin: Stage I pressure injury appreciated on his buttocks (present on admission); no signs of superimposed infection. Psychiatry: Mood & affect appropriate.     Data Reviewed: I have personally reviewed  following labs and imaging studies  CBC: Recent Labs  Lab 10/07/19 1013 10/08/19 0539  WBC 5.9 6.0  NEUTROABS 4.2 4.6  HGB 15.3 13.5  HCT 44.2 39.6  MCV 92.5 95.0  PLT 189 297   Basic Metabolic Panel: Recent Labs  Lab 10/07/19 1013 10/07/19 1635 10/08/19 0539  NA 133* 134* 138  K 2.1* 2.6* 3.5  CL 100 102 108  CO2 19* 18* 20*  GLUCOSE 109* 117* 129*  BUN 34* 34* 36*  CREATININE 2.11* 2.02* 1.87*  CALCIUM 8.4* 7.9* 8.2*  MG  --  1.9 2.2  PHOS  --  3.5 2.5   GFR: Estimated Creatinine Clearance: 38.8 mL/min (A) (by C-G formula based on SCr of 1.87 mg/dL (H)).   Liver Function Tests: Recent Labs  Lab 10/07/19 1013 10/07/19 1635 10/08/19 0539  AST 62*  --  50*  ALT 36  --  34  ALKPHOS 83  --  72  BILITOT 0.7  --  0.5  PROT 7.2  --  6.4*  ALBUMIN 3.7 3.4* 3.1*   Coagulation Profile: Recent Labs  Lab 10/07/19 1040  INR 1.0   Lipid Profile: Recent Labs    10/07/19 1013  TRIG 92   Anemia Panel: Recent Labs  10/07/19 1013 10/08/19 0539  FERRITIN 238 265   Urine analysis:    Component Value Date/Time   COLORURINE YELLOW 10/07/2019 1013   APPEARANCEUR CLEAR 10/07/2019 1013   LABSPEC 1.015 10/07/2019 1013   LABSPEC 1.025 09/06/2006 1104   PHURINE 5.0 10/07/2019 1013   GLUCOSEU NEGATIVE 10/07/2019 1013   HGBUR NEGATIVE 10/07/2019 1013   BILIRUBINUR NEGATIVE 10/07/2019 1013   BILIRUBINUR Negative 09/06/2006 1104   KETONESUR NEGATIVE 10/07/2019 1013   PROTEINUR 100 (A) 10/07/2019 1013   UROBILINOGEN 1.0 08/20/2008 1340   NITRITE NEGATIVE 10/07/2019 1013   LEUKOCYTESUR NEGATIVE 10/07/2019 1013   LEUKOCYTESUR Trace 09/06/2006 1104    Recent Results (from the past 240 hour(s))  Blood Culture (routine x 2)     Status: None (Preliminary result)   Collection Time: 10/07/19 10:13 AM   Specimen: BLOOD LEFT ARM  Result Value Ref Range Status   Specimen Description BLOOD LEFT ARM  Final   Special Requests   Final    BOTTLES DRAWN AEROBIC ONLY Blood  Culture adequate volume   Culture   Final    NO GROWTH < 24 HOURS Performed at [facility], [address] [place], [place] [address]    Report Status PENDING  Incomplete  Urine Culture     Status: None   Collection Time: 10/07/19 10:13 AM   Specimen: Urine, Clean Catch  Result Value Ref Range Status   Specimen Description   Final    URINE, CLEAN CATCH Performed at [facility], [address], [place], [place] [address]    Special Requests   Final    NONE Performed at [facility], [address], [place], [place] [address]    Culture   Final    NO GROWTH Performed at [facility], [place] [address] [place], [place] [address]    Report Status 10/08/2019 FINAL  Final  Blood Culture (routine x 2)     Status: None (Preliminary result)   Collection Time: 10/07/19 10:18 AM   Specimen: BLOOD LEFT HAND  Result Value Ref Range Status   Specimen Description BLOOD LEFT HAND  Final   Special Requests   Final    BOTTLES DRAWN AEROBIC ONLY Blood Culture results may not be optimal due to an inadequate volume of blood received in culture bottles   Culture   Final    NO GROWTH < 24 HOURS Performed at [facility], [address] [place], [place] [address]    Report Status PENDING  Incomplete     Radiology Studies: CT Head Wo Contrast  Result Date: 10/07/2019 CLINICAL DATA:  Encephalopathy, VP shunt EXAM: CT HEAD WITHOUT CONTRAST TECHNIQUE: Contiguous axial images were obtained from the base of the skull through the vertex without intravenous contrast. COMPARISON:  01/06/2017 FINDINGS: Brain: Right frontal approach shunt catheter is new from the prior study and terminates near midline within the interhemispheric fissure. Right parietal approach catheter has been discontinued. Ventricle caliber is similar. There is no acute intracranial hemorrhage or mass effect. No new loss of gray differentiation. Nonspecific primarily periventricular white matter hypoattenuation. Vascular: No hyperdense  vessel. Skull: Unremarkable apart from burr holes. Sinuses/Orbits: Patchy mucosal thickening with chronic sphenoid sinusitis. Other: Mastoid air cells are clear. IMPRESSION: Interval placement of right frontal approach shunt catheter. Similar enlargement of the third and lateral ventricles. No acute intracranial hemorrhage, mass effect, or evidence of acute infarction. Electronically Signed   By: Macy Mis M.D.   On: 10/07/2019 11:16   US Venous Img  Lower Bilateral (DVT)  Result Date: 10/08/2019 CLINICAL DATA:  Concern for pulmonary embolism. Bilateral lower extremity edema. EXAM: BILATERAL LOWER EXTREMITY VENOUS DOPPLER ULTRASOUND TECHNIQUE: Gray-scale sonography with graded compression, as well as color Doppler and duplex ultrasound were performed to evaluate the lower extremity deep venous systems from the level of the common femoral vein and including the common femoral, femoral, profunda femoral, popliteal and calf veins including the posterior tibial, peroneal and gastrocnemius veins when visible. The superficial great saphenous vein was also interrogated. Spectral Doppler was utilized to evaluate flow at rest and with distal augmentation maneuvers in the common femoral, femoral and popliteal veins. COMPARISON:  None. FINDINGS: RIGHT LOWER EXTREMITY Common Femoral Vein: No evidence of thrombus. Normal compressibility, respiratory phasicity and response to augmentation. Saphenofemoral Junction: No evidence of thrombus. Normal compressibility and flow on color Doppler imaging. Profunda Femoral Vein: No evidence of thrombus. Normal compressibility and flow on color Doppler imaging. Femoral Vein: No evidence of thrombus. Normal compressibility, respiratory phasicity and response to augmentation. Popliteal Vein: No evidence of thrombus. Normal compressibility, respiratory phasicity and response to augmentation. Calf Veins: No evidence of thrombus. Normal compressibility and flow on color Doppler imaging.  Superficial Great Saphenous Vein: No evidence of thrombus. Normal compressibility. Venous Reflux:  None. Other Findings: No evidence of superficial thrombophlebitis or abnormal fluid collection. LEFT LOWER EXTREMITY Common Femoral Vein: No evidence of thrombus. Normal compressibility, respiratory phasicity and response to augmentation. Saphenofemoral Junction: No evidence of thrombus. Normal compressibility and flow on color Doppler imaging. Profunda Femoral Vein: No evidence of thrombus. Normal compressibility and flow on color Doppler imaging. Femoral Vein: No evidence of thrombus. Normal compressibility, respiratory phasicity and response to augmentation. Popliteal Vein: No evidence of thrombus. Normal compressibility, respiratory phasicity and response to augmentation. Calf Veins: No evidence of thrombus. Normal compressibility and flow on color Doppler imaging. Superficial Great Saphenous Vein: No evidence of thrombus. Normal compressibility. Venous Reflux:  None. Other Findings: No evidence of superficial thrombophlebitis or abnormal fluid collection. IMPRESSION: No evidence of deep venous thrombosis in either lower extremity. Electronically Signed   By: Aletta Edouard M.D.   On: 10/08/2019 10:27   DG CHEST PORT 1 VIEW  Result Date: 10/08/2019 CLINICAL DATA:  Dyspnea, COVID positive EXAM: PORTABLE CHEST 1 VIEW COMPARISON:  Chest radiograph from one day prior. FINDINGS: Intact appearing visualized right sided VP shunt. Partially visualized surgical fixation hardware in the lower cervical spine. Stable cardiomediastinal silhouette with top-normal heart size. No pneumothorax. No pleural effusion. Lungs appear clear, with no acute consolidative airspace disease and no pulmonary edema. IMPRESSION: No active disease. Electronically Signed   By: Ilona Sorrel M.D.   On: 10/08/2019 10:48   DG Chest Port 1 View  Result Date: 10/07/2019 CLINICAL DATA:  EMS reports wife called because pt unable to get up off of  couch. Reports when they arrived pt was soaked in urine. Reports recently exposed to someone with covid. Reports cough for " a while." EMS reports fever 101.8. EXAM: PORTABLE CHEST - 1 VIEW COMPARISON:  07/29/2019 FINDINGS: Lungs are clear. Heart size upper limits normal for technique. Tortuous thoracic aorta. No effusion. No pneumothorax. Stable cervical fixation hardware. VP shunt tubing projects over the right hemithorax. IMPRESSION: No acute disease. Electronically Signed   By: Lucrezia Europe M.D.   On: 10/07/2019 10:34    Scheduled Meds: . amitriptyline  50 mg Oral QHS  . amLODipine  10 mg Oral Daily  . vitamin C  500 mg Oral Daily  .  dexamethasone  6 mg Oral Daily  . folic acid  1 mg Oral Daily  . heparin  5,000 Units Subcutaneous Q8H  . multivitamin with minerals  1 tablet Oral Daily  . polyethylene glycol  17 g Oral Daily  . polyvinyl alcohol  1-2 drop Both Eyes Daily  . senna-docusate  2 tablet Oral QHS  . sodium chloride flush  3 mL Intravenous Q12H  . terazosin  4 mg Oral QHS  . thiamine  100 mg Oral Daily  . topiramate  100 mg Oral Daily  . zinc sulfate  220 mg Oral Daily   Continuous Infusions: . sodium chloride    . 0.9 % NaCl with KCl 40 mEq / L 125 mL/hr (10/08/19 1310)     LOS: 1 day    Time spent: 35 minutes.     Barton Dubois, MD [facility] Pager [phone]  10/08/2019, 5:17 PM

## 2019-10-09 DIAGNOSIS — G9341 Metabolic encephalopathy: Secondary | ICD-10-CM

## 2019-10-09 DIAGNOSIS — N179 Acute kidney failure, unspecified: Secondary | ICD-10-CM

## 2019-10-09 DIAGNOSIS — N184 Chronic kidney disease, stage 4 (severe): Secondary | ICD-10-CM

## 2019-10-09 LAB — CBC WITH DIFFERENTIAL/PLATELET
Abs Immature Granulocytes: 0.02 10*3/uL (ref 0.00–0.07)
Basophils Absolute: 0 10*3/uL (ref 0.0–0.1)
Basophils Relative: 0 %
Eosinophils Absolute: 0 10*3/uL (ref 0.0–0.5)
Eosinophils Relative: 0 %
HCT: 38 % — ABNORMAL LOW (ref 39.0–52.0)
Hemoglobin: 12.6 g/dL — ABNORMAL LOW (ref 13.0–17.0)
Immature Granulocytes: 0 %
Lymphocytes Relative: 21 %
Lymphs Abs: 1.2 10*3/uL (ref 0.7–4.0)
MCH: 31.7 pg (ref 26.0–34.0)
MCHC: 33.2 g/dL (ref 30.0–36.0)
MCV: 95.5 fL (ref 80.0–100.0)
Monocytes Absolute: 0.4 10*3/uL (ref 0.1–1.0)
Monocytes Relative: 7 %
Neutro Abs: 4.1 10*3/uL (ref 1.7–7.7)
Neutrophils Relative %: 72 %
Platelets: 157 10*3/uL (ref 150–400)
RBC: 3.98 MIL/uL — ABNORMAL LOW (ref 4.22–5.81)
RDW: 13.2 % (ref 11.5–15.5)
WBC: 5.7 10*3/uL (ref 4.0–10.5)
nRBC: 0 % (ref 0.0–0.2)

## 2019-10-09 LAB — COMPREHENSIVE METABOLIC PANEL
ALT: 33 U/L (ref 0–44)
AST: 46 U/L — ABNORMAL HIGH (ref 15–41)
Albumin: 2.9 g/dL — ABNORMAL LOW (ref 3.5–5.0)
Alkaline Phosphatase: 63 U/L (ref 38–126)
Anion gap: 9 (ref 5–15)
BUN: 30 mg/dL — ABNORMAL HIGH (ref 8–23)
CO2: 15 mmol/L — ABNORMAL LOW (ref 22–32)
Calcium: 7.9 mg/dL — ABNORMAL LOW (ref 8.9–10.3)
Chloride: 114 mmol/L — ABNORMAL HIGH (ref 98–111)
Creatinine, Ser: 1.66 mg/dL — ABNORMAL HIGH (ref 0.61–1.24)
GFR calc Af Amer: 47 mL/min — ABNORMAL LOW (ref >60)
GFR calc non Af Amer: 41 mL/min — ABNORMAL LOW (ref >60)
Glucose, Bld: 100 mg/dL — ABNORMAL HIGH (ref 70–99)
Potassium: 3 mmol/L — ABNORMAL LOW (ref 3.5–5.1)
Sodium: 138 mmol/L (ref 135–145)
Total Bilirubin: 0.4 mg/dL (ref 0.3–1.2)
Total Protein: 6.1 g/dL — ABNORMAL LOW (ref 6.5–8.1)

## 2019-10-09 LAB — PHOSPHORUS: Phosphorus: 2.3 mg/dL — ABNORMAL LOW (ref 2.5–4.6)

## 2019-10-09 LAB — MAGNESIUM: Magnesium: 1.8 mg/dL (ref 1.7–2.4)

## 2019-10-09 LAB — D-DIMER, QUANTITATIVE: D-Dimer, Quant: 3.29 ug/mL-FEU — ABNORMAL HIGH (ref 0.00–0.50)

## 2019-10-09 LAB — FERRITIN: Ferritin: 287 ng/mL (ref 24–336)

## 2019-10-09 LAB — C-REACTIVE PROTEIN: CRP: 8.1 mg/dL — ABNORMAL HIGH (ref <1.0)

## 2019-10-09 MED ORDER — POTASSIUM CHLORIDE IN NACL 20-0.9 MEQ/L-% IV SOLN: INTRAVENOUS | Status: DC

## 2019-10-09 MED ORDER — POTASSIUM CHLORIDE CRYS ER 20 MEQ PO TBCR
40.0000 meq | EXTENDED_RELEASE_TABLET | Freq: Once | ORAL | Status: AC
  Administered 2019-10-09: 40 meq via ORAL
  Filled 2019-10-09: qty 2

## 2019-10-09 MED ORDER — MAGNESIUM SULFATE 2 GM/50ML IV SOLN
2.0000 g | Freq: Once | INTRAVENOUS | Status: AC
  Administered 2019-10-09: 2 g via INTRAVENOUS
  Filled 2019-10-09 (×2): qty 50

## 2019-10-09 NOTE — Care Management Important Message (Signed)
Important Message  Patient Details  Name: Albert Hahn MRN: [id number] Date of Birth: [birth year]   Medicare Important Message Given:  Yes - Important Message mailed due to current National Emergency   Verbal consent obtained due to current National Emergency  Relationship to patient: Spouse/Significant Other Contact Name: Albert Hahn Call Date: 10/09/19  Time: 1647 Phone: [phone] Outcome: Spoke with contact        Tommy Medal 10/09/2019, 4:47 PM

## 2019-10-09 NOTE — Plan of Care (Signed)

## 2019-10-09 NOTE — Clinical Social Work Note (Signed)
Called Ms. Schuchart to discuss bed offers. Mrs. Howat stated that she was talking with patient via phone and would call LCSW back.    Duriel Deery, Clydene Pugh, LCSW

## 2019-10-09 NOTE — TOC Progression Note (Signed)
Transition of Care [facility]) - Progression Note    Patient Details  Name: Albert Hahn MRN: [id number] Date of Birth: August 19, 1945  Transition of Care [facility]) CM/SW Contact  Ihor Gully, LCSW Phone Number: 10/09/2019, 4:04 PM  Clinical Narrative:    Authorization started with [employer]. Family chose [facility]. Facility aware of potential discharge tomorrow.    Expected Discharge Plan: [place] Barriers to Discharge: Continued Medical Work up  Expected Discharge Plan and Services Expected Discharge Plan: [place] arrangements for the past 2 months: Single Family Home                                       Social Determinants of Health (SDOH) Interventions    Readmission Risk Interventions No flowsheet data found.

## 2019-10-09 NOTE — Plan of Care (Signed)
Patient alert and oriented to self. VSS overnight, RR slightly elevated to 24. On room air. Spoke with patient's wife Neldon Shepard, who states that patient's AMS has changed just prior to the patient being brought to the hospital. She expresses concerns of his readiness for discharge to a SNF. She was updated on the patient current status at the beginning of the shift. Patient self removed one IV. Patient currently still has MIVF infusing. Condom cath in place d/t incontinence. Adequate urine output overnight. Patient turned q 2 hr to maintain skin integrity. No complaints of pain overnight. Will continue to monitor.    Problem: Nutrition: Goal: Adequate nutrition will be maintained Outcome: Progressing   Problem: Elimination: Goal: Will not experience complications related to bowel motility Outcome: Progressing   Problem: Elimination: Goal: Will not experience complications related to urinary retention Outcome: Progressing   Problem: Pain Managment: Goal: General experience of comfort will improve Outcome: Progressing   Problem: Safety: Goal: Ability to remain free from injury will improve Outcome: Progressing   Problem: Skin Integrity: Goal: Risk for impaired skin integrity will decrease Outcome: Progressing

## 2019-10-09 NOTE — Progress Notes (Signed)
PROGRESS NOTE  Albert Hahn HGD:[id number] DOB: [birth year] DOA: 10/07/2019 PCP: Asencion Noble, MD  Brief History:  74 y.o. male with past medical history relevant for history of NHL,  VP shunt for NPH, PTSD, HTN, with recent COVID-19 exposure who presents to the ED with several days of generalized weakness, fatigue, fevers, cough,  tachypnea without hypoxia.  Pt received first dose of COVID-19 vaccination about 2 weeks ago.  He is due for his second dose on 10/12/19.  EMS was activated due to pt having generalized weakness to the point of being unable to get off couch.  EMS reports patient was soaked in urine on their arrival has been coughing.  He denied f/c cp, sob, n/v/d. In Ed Chest x-ray without acute findings, UA not consistent with UTI, WBCs only 5.9, lactic acid is 0.8 -Procalcitonin 0.13--- , -Influenza negative -CRP is 9.2, D-dimer 6.87 (No hypoxia), LDH elevated at 212, fibrinogen elevated at 576, -Ferritin 238, triglycerides 92,  -Creatinine is 2.1, potassium is also 2.1   Assessment/Plan: SIRS -present on admission -sepsis ruled out -due to COVID-19 infection -d/c vanco/cefepime  COVID-19 Infection -no hypoxia -personally reviewed CXR--no infiltrates, no edema -CRP 9.2>>>8.1>>>8.1 -d-Dimer-6.87>>>5.01>>>3.29 -ferritin 238>>265>>287 -PCT 0.13 -LDH 212 -fibrinogen 576 -continue zinc and vitamin c -d/c steroids--currently does not have dyspnea or hypoxia  Acute on chronic renal failure--CKD 3a -baseline creatinine 1.3-1.4 -serum creatinine peaked 2.62 -due to volume depletion -continue IVF  HTN -continue amlodipine, terazosin  Hypokalemia -replete -mag 1.8  Anxiety/Depression/PTSD -continue alprazolam, topiramate, Elavil, temazepam at hs  Acute metabolic Encephalopathy -?due to steroids -TSH -B12 -folate -ammonia  Stage 1 gluteal pressure ulcer  -present on admission -continue local care     Total time spent 35 minutes.  Greater than  50% spent face to face counseling and coordinating care.    Disposition Plan: Patient From: Home D/C Place: SNF - 1-2  Days Barriers: Not Clinically Stable--renal function not back to baseline  Family Communication:   Spouse updated 2/24  Consultants:  none  Code Status:  FULL   DVT Prophylaxis:  [place] Heparin    Procedures: As Listed in Progress Note Above  Antibiotics: None     Subjective: I hurt all over.  Patient denies fevers, chills, headache,  dyspnea, nausea, vomiting, diarrhea, abdominal pain, dysuria, hematuria, hematochezia, and melena.   Objective: Vitals:   10/08/19 2018 10/09/19 0257 10/09/19 0550 10/09/19 1430  BP: (!) 153/75 (!) 147/74 (!) 146/73 128/74  Pulse: 100 96 91 70  Resp: (!) 24 (!) 24 20   Temp: 100.2 F (37.9 C) 98.6 F (37 C) 99.8 F (37.7 C) 98.9 F (37.2 C)  TempSrc: Oral Oral Oral Oral  SpO2: 95% 96% 96% 95%  Weight:      Height:        Intake/Output Summary (Last 24 hours) at 10/09/2019 1653 Last data filed at 10/09/2019 1500 Gross per 24 hour  Intake 1289.86 ml  Output 2625 ml  Net -1335.14 ml   Weight change:  Exam:   General:  Pt is alert, follows commands appropriately, not in acute distress  HEENT: No icterus, No thrush, No neck mass, [place]/AT  Cardiovascular: RRR, S1/S2, no rubs, no gallops  Respiratory: poor inspiratory effort, bibasilar rales. No wheeze  Abdomen: Soft/+BS, non tender, non distended, no guarding  Extremities: No edema, No lymphangitis, No petechiae, No rashes, no synovitis   Data Reviewed: I have personally reviewed following labs and imaging studies  Basic Metabolic Panel: Recent Labs  Lab 10/07/19 1013 10/07/19 1635 10/08/19 0539 10/09/19 0540  NA 133* 134* 138 138  K 2.1* 2.6* 3.5 3.0*  CL 100 102 108 114*  CO2 19* 18* 20* 15*  GLUCOSE 109* 117* 129* 100*  BUN 34* 34* 36* 30*  CREATININE 2.11* 2.02* 1.87* 1.66*  CALCIUM 8.4* 7.9* 8.2* 7.9*  MG  --  1.9 2.2 1.8  PHOS  --  3.5 2.5  2.3*   Liver Function Tests: Recent Labs  Lab 10/07/19 1013 10/07/19 1635 10/08/19 0539 10/09/19 0540  AST 62*  --  50* 46*  ALT 36  --  34 33  ALKPHOS 83  --  72 63  BILITOT 0.7  --  0.5 0.4  PROT 7.2  --  6.4* 6.1*  ALBUMIN 3.7 3.4* 3.1* 2.9*   No results for input(s): LIPASE, AMYLASE in the last 168 hours. No results for input(s): AMMONIA in the last 168 hours. Coagulation Profile: Recent Labs  Lab 10/07/19 1040  INR 1.0   CBC: Recent Labs  Lab 10/07/19 1013 10/08/19 0539 10/09/19 0540  WBC 5.9 6.0 5.7  NEUTROABS 4.2 4.6 4.1  HGB 15.3 13.5 12.6*  HCT 44.2 39.6 38.0*  MCV 92.5 95.0 95.5  PLT 189 170 157   Cardiac Enzymes: No results for input(s): CKTOTAL, CKMB, CKMBINDEX, TROPONINI in the last 168 hours. BNP: Invalid input(s): POCBNP CBG: No results for input(s): GLUCAP in the last 168 hours. HbA1C: No results for input(s): HGBA1C in the last 72 hours. Urine analysis:    Component Value Date/Time   COLORURINE YELLOW 10/07/2019 1013   APPEARANCEUR CLEAR 10/07/2019 1013   LABSPEC 1.015 10/07/2019 1013   LABSPEC 1.025 09/06/2006 1104   PHURINE 5.0 10/07/2019 1013   GLUCOSEU NEGATIVE 10/07/2019 1013   HGBUR NEGATIVE 10/07/2019 1013   BILIRUBINUR NEGATIVE 10/07/2019 1013   BILIRUBINUR Negative 09/06/2006 1104   KETONESUR NEGATIVE 10/07/2019 1013   PROTEINUR 100 (A) 10/07/2019 1013   UROBILINOGEN 1.0 08/20/2008 1340   NITRITE NEGATIVE 10/07/2019 1013   LEUKOCYTESUR NEGATIVE 10/07/2019 1013   LEUKOCYTESUR Trace 09/06/2006 1104   Sepsis Labs: @LABRCNTIP (procalcitonin:4,lacticidven:4) ) Recent Results (from the past 240 hour(s))  Blood Culture (routine x 2)     Status: None (Preliminary result)   Collection Time: 10/07/19 10:13 AM   Specimen: BLOOD LEFT ARM  Result Value Ref Range Status   Specimen Description BLOOD LEFT ARM  Final   Special Requests   Final    BOTTLES DRAWN AEROBIC ONLY Blood Culture adequate volume   Culture   Final    NO GROWTH  2 DAYS Performed at [facility], [address] [place], [place] [address]    Report Status PENDING  Incomplete  Urine Culture     Status: None   Collection Time: 10/07/19 10:13 AM   Specimen: Urine, Clean Catch  Result Value Ref Range Status   Specimen Description   Final    URINE, CLEAN CATCH Performed at [facility], [address], [place], [place] [address]    Special Requests   Final    NONE Performed at [facility], [address], [place], [place] [address]    Culture   Final    NO GROWTH Performed at [facility], [place] [address] [place],  [address]    Report Status 10/08/2019 FINAL  Final  Blood Culture (routine x 2)     Status: None (Preliminary result)   Collection Time: 10/07/19 10:18 AM  Specimen: BLOOD LEFT HAND  Result Value Ref Range Status   Specimen Description BLOOD LEFT HAND  Final   Special Requests   Final    BOTTLES DRAWN AEROBIC ONLY Blood Culture results may not be optimal due to an inadequate volume of blood received in culture bottles   Culture   Final    NO GROWTH 2 DAYS Performed at [facility], [address] [place],  [address]    Report Status PENDING  Incomplete     Scheduled Meds: . amitriptyline  50 mg Oral QHS  . amLODipine  10 mg Oral Daily  . vitamin C  500 mg Oral Daily  . dexamethasone  6 mg Oral Daily  . folic acid  1 mg Oral Daily  . heparin  5,000 Units Subcutaneous Q8H  . multivitamin with minerals  1 tablet Oral Daily  . polyethylene glycol  17 g Oral Daily  . polyvinyl alcohol  1-2 drop Both Eyes Daily  . potassium chloride  40 mEq Oral Once  . senna-docusate  2 tablet Oral QHS  . sodium chloride flush  3 mL Intravenous Q12H  . terazosin  4 mg Oral QHS  . thiamine  100 mg Oral Daily  . topiramate  100 mg Oral Daily  . zinc sulfate  220 mg Oral Daily   Continuous Infusions: . sodium chloride    . 0.9 % NaCl with KCl 20 mEq / L      Procedures/Studies: CT Head Wo Contrast  Result Date:  10/07/2019 CLINICAL DATA:  Encephalopathy, VP shunt EXAM: CT HEAD WITHOUT CONTRAST TECHNIQUE: Contiguous axial images were obtained from the base of the skull through the vertex without intravenous contrast. COMPARISON:  01/06/2017 FINDINGS: Brain: Right frontal approach shunt catheter is new from the prior study and terminates near midline within the interhemispheric fissure. Right parietal approach catheter has been discontinued. Ventricle caliber is similar. There is no acute intracranial hemorrhage or mass effect. No new loss of gray differentiation. Nonspecific primarily periventricular white matter hypoattenuation. Vascular: No hyperdense vessel. Skull: Unremarkable apart from burr holes. Sinuses/Orbits: Patchy mucosal thickening with chronic sphenoid sinusitis. Other: Mastoid air cells are clear. IMPRESSION: Interval placement of right frontal approach shunt catheter. Similar enlargement of the third and lateral ventricles. No acute intracranial hemorrhage, mass effect, or evidence of acute infarction. Electronically Signed   By: Macy Mis M.D.   On: 10/07/2019 11:16   US Venous Img Lower Bilateral (DVT)  Result Date: 10/08/2019 CLINICAL DATA:  Concern for pulmonary embolism. Bilateral lower extremity edema. EXAM: BILATERAL LOWER EXTREMITY VENOUS DOPPLER ULTRASOUND TECHNIQUE: Gray-scale sonography with graded compression, as well as color Doppler and duplex ultrasound were performed to evaluate the lower extremity deep venous systems from the level of the common femoral vein and including the common femoral, femoral, profunda femoral, popliteal and calf veins including the posterior tibial, peroneal and gastrocnemius veins when visible. The superficial great saphenous vein was also interrogated. Spectral Doppler was utilized to evaluate flow at rest and with distal augmentation maneuvers in the common femoral, femoral and popliteal veins. COMPARISON:  None. FINDINGS: RIGHT LOWER EXTREMITY Common  Femoral Vein: No evidence of thrombus. Normal compressibility, respiratory phasicity and response to augmentation. Saphenofemoral Junction: No evidence of thrombus. Normal compressibility and flow on color Doppler imaging. Profunda Femoral Vein: No evidence of thrombus. Normal compressibility and flow on color Doppler imaging. Femoral Vein: No evidence of thrombus. Normal compressibility, respiratory phasicity and response to augmentation. Popliteal Vein: No evidence of thrombus. Normal  compressibility, respiratory phasicity and response to augmentation. Calf Veins: No evidence of thrombus. Normal compressibility and flow on color Doppler imaging. Superficial Great Saphenous Vein: No evidence of thrombus. Normal compressibility. Venous Reflux:  None. Other Findings: No evidence of superficial thrombophlebitis or abnormal fluid collection. LEFT LOWER EXTREMITY Common Femoral Vein: No evidence of thrombus. Normal compressibility, respiratory phasicity and response to augmentation. Saphenofemoral Junction: No evidence of thrombus. Normal compressibility and flow on color Doppler imaging. Profunda Femoral Vein: No evidence of thrombus. Normal compressibility and flow on color Doppler imaging. Femoral Vein: No evidence of thrombus. Normal compressibility, respiratory phasicity and response to augmentation. Popliteal Vein: No evidence of thrombus. Normal compressibility, respiratory phasicity and response to augmentation. Calf Veins: No evidence of thrombus. Normal compressibility and flow on color Doppler imaging. Superficial Great Saphenous Vein: No evidence of thrombus. Normal compressibility. Venous Reflux:  None. Other Findings: No evidence of superficial thrombophlebitis or abnormal fluid collection. IMPRESSION: No evidence of deep venous thrombosis in either lower extremity. Electronically Signed   By: Aletta Edouard M.D.   On: 10/08/2019 10:27   DG CHEST PORT 1 VIEW  Result Date: 10/08/2019 CLINICAL DATA:   Dyspnea, COVID positive EXAM: PORTABLE CHEST 1 VIEW COMPARISON:  Chest radiograph from one day prior. FINDINGS: Intact appearing visualized right sided VP shunt. Partially visualized surgical fixation hardware in the lower cervical spine. Stable cardiomediastinal silhouette with top-normal heart size. No pneumothorax. No pleural effusion. Lungs appear clear, with no acute consolidative airspace disease and no pulmonary edema. IMPRESSION: No active disease. Electronically Signed   By: Ilona Sorrel M.D.   On: 10/08/2019 10:48   DG Chest Port 1 View  Result Date: 10/07/2019 CLINICAL DATA:  EMS reports wife called because pt unable to get up off of couch. Reports when they arrived pt was soaked in urine. Reports recently exposed to someone with covid. Reports cough for " a while." EMS reports fever 101.8. EXAM: PORTABLE CHEST - 1 VIEW COMPARISON:  07/29/2019 FINDINGS: Lungs are clear. Heart size upper limits normal for technique. Tortuous thoracic aorta. No effusion. No pneumothorax. Stable cervical fixation hardware. VP shunt tubing projects over the right hemithorax. IMPRESSION: No acute disease. Electronically Signed   By: Lucrezia Europe M.D.   On: 10/07/2019 10:34    Orson Eva, DO  [facility]  If 7PM-7AM, please contact night-coverage [email or web address] Password [email or web address] 10/09/2019, 4:53 PM   LOS: 2 days

## 2019-10-09 NOTE — TOC Progression Note (Signed)
Transition of Care [facility]) - Progression Note    Patient Details  Name: Albert Hahn MRN: [id number] Date of Birth: Jan 10, 1946  [facility] CM/SW Contact  Ihor Gully, LCSW Phone Number: 10/09/2019, 1:21 PM  Clinical Narrative:    Patient's wife provided bed offers. She states that she would like to speak with her daughters prior to making a choice. She stated that at baseline, patient is very intelligent. He has PTSD and can be "manic depressant" due to events that happened in [place] that he cannot get over. They have been married 4 years. Mrs. Albert Hahn voiced that she wants to ensure that patient is medically stable before he is allowed to discharge.    Expected Discharge Plan: [place] Barriers to Discharge: Continued Medical Work up  Expected Discharge Plan and Services Expected Discharge Plan: [place] arrangements for the past 2 months: Single Family Home                                       Social Determinants of Health (SDOH) Interventions    Readmission Risk Interventions No flowsheet data found.

## 2019-10-10 ENCOUNTER — Inpatient Hospital Stay (HOSPITAL_COMMUNITY): Payer: No Typology Code available for payment source

## 2019-10-10 DIAGNOSIS — N1831 Chronic kidney disease, stage 3a: Secondary | ICD-10-CM

## 2019-10-10 DIAGNOSIS — G9341 Metabolic encephalopathy: Secondary | ICD-10-CM

## 2019-10-10 DIAGNOSIS — R7401 Elevation of levels of liver transaminase levels: Secondary | ICD-10-CM

## 2019-10-10 DIAGNOSIS — Z982 Presence of cerebrospinal fluid drainage device: Secondary | ICD-10-CM

## 2019-10-10 LAB — CBC WITH DIFFERENTIAL/PLATELET
Abs Immature Granulocytes: 0.04 10*3/uL (ref 0.00–0.07)
Basophils Absolute: 0 10*3/uL (ref 0.0–0.1)
Basophils Relative: 0 %
Eosinophils Absolute: 0 10*3/uL (ref 0.0–0.5)
Eosinophils Relative: 0 %
HCT: 42 % (ref 39.0–52.0)
Hemoglobin: 13.7 g/dL (ref 13.0–17.0)
Immature Granulocytes: 1 %
Lymphocytes Relative: 16 %
Lymphs Abs: 1 10*3/uL (ref 0.7–4.0)
MCH: 32.2 pg (ref 26.0–34.0)
MCHC: 32.6 g/dL (ref 30.0–36.0)
MCV: 98.6 fL (ref 80.0–100.0)
Monocytes Absolute: 0.4 10*3/uL (ref 0.1–1.0)
Monocytes Relative: 6 %
Neutro Abs: 4.8 10*3/uL (ref 1.7–7.7)
Neutrophils Relative %: 77 %
Platelets: 70 10*3/uL — ABNORMAL LOW (ref 150–400)
RBC: 4.26 MIL/uL (ref 4.22–5.81)
RDW: 13.4 % (ref 11.5–15.5)
WBC: 6.2 10*3/uL (ref 4.0–10.5)
nRBC: 0 % (ref 0.0–0.2)

## 2019-10-10 LAB — FOLATE: Folate: 15.1 ng/mL (ref >5.9)

## 2019-10-10 LAB — COMPREHENSIVE METABOLIC PANEL
ALT: 32 U/L (ref 0–44)
AST: 62 U/L — ABNORMAL HIGH (ref 15–41)
Albumin: 3 g/dL — ABNORMAL LOW (ref 3.5–5.0)
Alkaline Phosphatase: 63 U/L (ref 38–126)
Anion gap: 10 (ref 5–15)
BUN: 30 mg/dL — ABNORMAL HIGH (ref 8–23)
CO2: 15 mmol/L — ABNORMAL LOW (ref 22–32)
Calcium: 8.3 mg/dL — ABNORMAL LOW (ref 8.9–10.3)
Chloride: 116 mmol/L — ABNORMAL HIGH (ref 98–111)
Creatinine, Ser: 1.49 mg/dL — ABNORMAL HIGH (ref 0.61–1.24)
GFR calc Af Amer: 54 mL/min — ABNORMAL LOW (ref >60)
GFR calc non Af Amer: 46 mL/min — ABNORMAL LOW (ref >60)
Glucose, Bld: 92 mg/dL (ref 70–99)
Potassium: 4.4 mmol/L (ref 3.5–5.1)
Sodium: 141 mmol/L (ref 135–145)
Total Bilirubin: 1.6 mg/dL — ABNORMAL HIGH (ref 0.3–1.2)
Total Protein: 6.7 g/dL (ref 6.5–8.1)

## 2019-10-10 LAB — VITAMIN B12: Vitamin B-12: 379 pg/mL (ref 180–914)

## 2019-10-10 LAB — T4, FREE: Free T4: 0.59 ng/dL — ABNORMAL LOW (ref 0.61–1.12)

## 2019-10-10 LAB — C-REACTIVE PROTEIN: CRP: 8.4 mg/dL — ABNORMAL HIGH (ref <1.0)

## 2019-10-10 LAB — PHOSPHORUS: Phosphorus: 1.9 mg/dL — ABNORMAL LOW (ref 2.5–4.6)

## 2019-10-10 LAB — AMMONIA: Ammonia: 41 umol/L — ABNORMAL HIGH (ref 9–35)

## 2019-10-10 LAB — TSH: TSH: 1.637 u[IU]/mL (ref 0.350–4.500)

## 2019-10-10 LAB — MAGNESIUM: Magnesium: 2.5 mg/dL — ABNORMAL HIGH (ref 1.7–2.4)

## 2019-10-10 LAB — D-DIMER, QUANTITATIVE: D-Dimer, Quant: 5.29 ug/mL-FEU — ABNORMAL HIGH (ref 0.00–0.50)

## 2019-10-10 LAB — FERRITIN: Ferritin: 418 ng/mL — ABNORMAL HIGH (ref 24–336)

## 2019-10-10 MED ORDER — SODIUM CHLORIDE 0.9 % IV SOLN: INTRAVENOUS | Status: AC

## 2019-10-10 MED ORDER — K PHOS MONO-SOD PHOS DI & MONO 155-852-130 MG PO TABS
500.0000 mg | ORAL_TABLET | Freq: Two times a day (BID) | ORAL | Status: DC
  Administered 2019-10-10 – 2019-10-13 (×7): 500 mg via ORAL
  Filled 2019-10-10 (×7): qty 2

## 2019-10-10 NOTE — Plan of Care (Signed)

## 2019-10-10 NOTE — Progress Notes (Signed)
Wife updated on phone 

## 2019-10-10 NOTE — Progress Notes (Signed)
PROGRESS NOTE  Albert Hahn ZLD:[id number] DOB: [birth year] DOA: 10/07/2019 PCP: Asencion Noble, MD  Brief History:  73 y.o.malewith past medical history relevant for history of NHL,  VP shunt for NPH, PTSD, HTN, with recent COVID-19 exposure who presents to the ED with several days of generalized weakness, fatigue, fevers, cough,  tachypnea without hypoxia.  Pt received first dose of COVID-19 vaccination about 2 weeks ago.  He is due for his second dose on 10/12/19.  EMS was activated due to pt having generalized weakness to the point of being unable to get off couch.  EMS reports patient was soaked in urine on their arrival has been coughing.  He denied f/c cp, sob, n/v/d. In [place] x-ray without acute findings,UA not consistent with UTI,WBCs only 5.9,lactic acid is 0.8 -Procalcitonin 0.13--- , -Influenza negative -CRP is 9.2,D-dimer 6.87(Nohypoxia),LDH elevated at 212,fibrinogen elevated at 576, -Ferritin 238,triglycerides 92,  -Creatinine is 2.1,potassium is also 2.1   Assessment/Plan: SIRS -present on admission -sepsis ruled out -due to COVID-19 infection -d/c vanco/cefepime  COVID-19 Infection -no hypoxia -personally reviewed CXR--no infiltrates, no edema -CRP 9.2>>>8.1>>>8.1>>8.4 -d-Dimer-6.87>>>5.01>>>3.29>>5.29 -ferritin 238>>265>>287>>418 -PCT 0.13 -LDH 212 -fibrinogen 576 -continue zinc and vitamin c -d/c steroids--currently does not have dyspnea or hypoxia  Acute on chronic renal failure--CKD 3a -baseline creatinine 1.3-1.4 -serum creatinine peaked 2.62 -due to volume depletion -continue IVF  HTN -continue amlodipine, terazosin  Hypokalemia -replete -mag 1.8  Anxiety/Depression/PTSD -continue alprazolam, topiramate, Elavil, temazepam at hs  Acute metabolic Encephalopathy -?due to steroids, hospital delirium -TSH--1.637 -B12-379 -folate--15.1 -ammonia--41 -remains pleasantly confused -MR brain cannot be obtained due to  shunt incompatibility -repeat CT brain  Stage 1 gluteal pressure ulcer  -present on admission -continue local care     Total time spent 35 minutes.  Greater than 50% spent face to face counseling and coordinating care.    Disposition Plan: Patient From: Home D/C Place: SNF - 1-2  Days Barriers: Not Clinically Stable--renal function not back to baseline  Family Communication:   Daughter updated 2/25  Consultants:  none  Code Status:  FULL   DVT Prophylaxis:  [place] Heparin    Procedures: As Listed in Progress Note Above  Antibiotics: None    Subjective: Pt is pleasantly confused.  He denies cp, sob.  He complains of some upper abd pain.  Denies n/v, f/c.  No reported diarrhea.  Objective: Vitals:   10/10/19 0550 10/10/19 0830 10/10/19 0930 10/10/19 1400  BP: 137/80  130/63 138/78  Pulse:    86  Resp: 20   18  Temp: 100 F (37.8 C) 99 F (37.2 C)  99.9 F (37.7 C)  TempSrc: Oral   Oral  SpO2: 96%     Weight:      Height:        Intake/Output Summary (Last 24 hours) at 10/10/2019 1526 Last data filed at 10/10/2019 0300 Gross per 24 hour  Intake 675 ml  Output 500 ml  Net 175 ml   Weight change:  Exam:   General:  Pt is alert, follows commands appropriately, not in acute distress  HEENT: No icterus, No thrush, No neck mass, [place]/AT  Cardiovascular: RRR, S1/S2, no rubs, no gallops  Respiratory: bibasilar crackles. No wheeze  Abdomen: Soft/+BS, mild upper abd tender, non distended, no guarding  Extremities: No edema, No lymphangitis, No petechiae, No rashes, no synovitis   Data Reviewed: I have personally reviewed following labs and imaging studies Basic Metabolic Panel: Recent  Labs  Lab 10/07/19 1013 10/07/19 1635 10/08/19 0539 10/09/19 0540 10/10/19 0447  NA 133* 134* 138 138 141  K 2.1* 2.6* 3.5 3.0* 4.4  CL 100 102 108 114* 116*  CO2 19* 18* 20* 15* 15*  GLUCOSE 109* 117* 129* 100* 92  BUN 34* 34* 36* 30* 30*    CREATININE 2.11* 2.02* 1.87* 1.66* 1.49*  CALCIUM 8.4* 7.9* 8.2* 7.9* 8.3*  MG  --  1.9 2.2 1.8 2.5*  PHOS  --  3.5 2.5 2.3* 1.9*   Liver Function Tests: Recent Labs  Lab 10/07/19 1013 10/07/19 1635 10/08/19 0539 10/09/19 0540 10/10/19 0447  AST 62*  --  50* 46* 62*  ALT 36  --  34 33 32  ALKPHOS 83  --  72 63 63  BILITOT 0.7  --  0.5 0.4 1.6*  PROT 7.2  --  6.4* 6.1* 6.7  ALBUMIN 3.7 3.4* 3.1* 2.9* 3.0*   No results for input(s): LIPASE, AMYLASE in the last 168 hours. Recent Labs  Lab 10/10/19 0447  AMMONIA 41*   Coagulation Profile: Recent Labs  Lab 10/07/19 1040  INR 1.0   CBC: Recent Labs  Lab 10/07/19 1013 10/08/19 0539 10/09/19 0540 10/10/19 0447  WBC 5.9 6.0 5.7 6.2  NEUTROABS 4.2 4.6 4.1 4.8  HGB 15.3 13.5 12.6* 13.7  HCT 44.2 39.6 38.0* 42.0  MCV 92.5 95.0 95.5 98.6  PLT 189 170 157 70*   Cardiac Enzymes: No results for input(s): CKTOTAL, CKMB, CKMBINDEX, TROPONINI in the last 168 hours. BNP: Invalid input(s): POCBNP CBG: No results for input(s): GLUCAP in the last 168 hours. HbA1C: No results for input(s): HGBA1C in the last 72 hours. Urine analysis:    Component Value Date/Time   COLORURINE YELLOW 10/07/2019 1013   APPEARANCEUR CLEAR 10/07/2019 1013   LABSPEC 1.015 10/07/2019 1013   LABSPEC 1.025 09/06/2006 1104   PHURINE 5.0 10/07/2019 1013   GLUCOSEU NEGATIVE 10/07/2019 1013   HGBUR NEGATIVE 10/07/2019 1013   BILIRUBINUR NEGATIVE 10/07/2019 1013   BILIRUBINUR Negative 09/06/2006 1104   KETONESUR NEGATIVE 10/07/2019 1013   PROTEINUR 100 (A) 10/07/2019 1013   UROBILINOGEN 1.0 08/20/2008 1340   NITRITE NEGATIVE 10/07/2019 1013   LEUKOCYTESUR NEGATIVE 10/07/2019 1013   LEUKOCYTESUR Trace 09/06/2006 1104   Sepsis Labs: @LABRCNTIP (procalcitonin:4,lacticidven:4) ) Recent Results (from the past 240 hour(s))  Blood Culture (routine x 2)     Status: None (Preliminary result)   Collection Time: 10/07/19 10:13 AM   Specimen: BLOOD LEFT  ARM  Result Value Ref Range Status   Specimen Description BLOOD LEFT ARM  Final   Special Requests   Final    BOTTLES DRAWN AEROBIC ONLY Blood Culture adequate volume   Culture   Final    NO GROWTH 3 DAYS Performed at [facility], [address] [place], [place] [address]    Report Status PENDING  Incomplete  Urine Culture     Status: None   Collection Time: 10/07/19 10:13 AM   Specimen: Urine, Clean Catch  Result Value Ref Range Status   Specimen Description   Final    URINE, CLEAN CATCH Performed at [facility], [address], [place], [place] [address]    Special Requests   Final    NONE Performed at [facility], [address] [place], [place] [address]    Culture   Final    NO GROWTH Performed at [facility], [place] [address], [place], [place] [address]    Report Status 10/08/2019  FINAL  Final  Blood Culture (routine x 2)     Status: None (Preliminary result)   Collection Time: 10/07/19 10:18 AM   Specimen: BLOOD LEFT HAND  Result Value Ref Range Status   Specimen Description BLOOD LEFT HAND  Final   Special Requests   Final    BOTTLES DRAWN AEROBIC ONLY Blood Culture results may not be optimal due to an inadequate volume of blood received in culture bottles   Culture   Final    NO GROWTH 3 DAYS Performed at [facility], [address] [place], [place] [address]    Report Status PENDING  Incomplete     Scheduled Meds: . amitriptyline  50 mg Oral QHS  . amLODipine  10 mg Oral Daily  . vitamin C  500 mg Oral Daily  . dexamethasone  6 mg Oral Daily  . folic acid  1 mg Oral Daily  . heparin  5,000 Units Subcutaneous Q8H  . multivitamin with minerals  1 tablet Oral Daily  . polyethylene glycol  17 g Oral Daily  . polyvinyl alcohol  1-2 drop Both Eyes Daily  . senna-docusate  2 tablet Oral QHS  . sodium chloride flush  3 mL Intravenous Q12H  . terazosin  4 mg Oral QHS  . thiamine  100 mg Oral Daily  . topiramate  100 mg Oral Daily  . zinc sulfate  220  mg Oral Daily   Continuous Infusions: . sodium chloride    . 0.9 % NaCl with KCl 20 mEq / L Stopped (10/10/19 1441)    Procedures/Studies: CT Head Wo Contrast  Result Date: 10/07/2019 CLINICAL DATA:  Encephalopathy, VP shunt EXAM: CT HEAD WITHOUT CONTRAST TECHNIQUE: Contiguous axial images were obtained from the base of the skull through the vertex without intravenous contrast. COMPARISON:  01/06/2017 FINDINGS: Brain: Right frontal approach shunt catheter is new from the prior study and terminates near midline within the interhemispheric fissure. Right parietal approach catheter has been discontinued. Ventricle caliber is similar. There is no acute intracranial hemorrhage or mass effect. No new loss of gray differentiation. Nonspecific primarily periventricular white matter hypoattenuation. Vascular: No hyperdense vessel. Skull: Unremarkable apart from burr holes. Sinuses/Orbits: Patchy mucosal thickening with chronic sphenoid sinusitis. Other: Mastoid air cells are clear. IMPRESSION: Interval placement of right frontal approach shunt catheter. Similar enlargement of the third and lateral ventricles. No acute intracranial hemorrhage, mass effect, or evidence of acute infarction. Electronically Signed   By: Macy Mis M.D.   On: 10/07/2019 11:16   US Venous Img Lower Bilateral (DVT)  Result Date: 10/08/2019 CLINICAL DATA:  Concern for pulmonary embolism. Bilateral lower extremity edema. EXAM: BILATERAL LOWER EXTREMITY VENOUS DOPPLER ULTRASOUND TECHNIQUE: Gray-scale sonography with graded compression, as well as color Doppler and duplex ultrasound were performed to evaluate the lower extremity deep venous systems from the level of the common femoral vein and including the common femoral, femoral, profunda femoral, popliteal and calf veins including the posterior tibial, peroneal and gastrocnemius veins when visible. The superficial great saphenous vein was also interrogated. Spectral Doppler was  utilized to evaluate flow at rest and with distal augmentation maneuvers in the common femoral, femoral and popliteal veins. COMPARISON:  None. FINDINGS: RIGHT LOWER EXTREMITY Common Femoral Vein: No evidence of thrombus. Normal compressibility, respiratory phasicity and response to augmentation. Saphenofemoral Junction: No evidence of thrombus. Normal compressibility and flow on color Doppler imaging. Profunda Femoral Vein: No evidence of thrombus. Normal compressibility and flow on color Doppler imaging. Femoral Vein: No  evidence of thrombus. Normal compressibility, respiratory phasicity and response to augmentation. Popliteal Vein: No evidence of thrombus. Normal compressibility, respiratory phasicity and response to augmentation. Calf Veins: No evidence of thrombus. Normal compressibility and flow on color Doppler imaging. Superficial Great Saphenous Vein: No evidence of thrombus. Normal compressibility. Venous Reflux:  None. Other Findings: No evidence of superficial thrombophlebitis or abnormal fluid collection. LEFT LOWER EXTREMITY Common Femoral Vein: No evidence of thrombus. Normal compressibility, respiratory phasicity and response to augmentation. Saphenofemoral Junction: No evidence of thrombus. Normal compressibility and flow on color Doppler imaging. Profunda Femoral Vein: No evidence of thrombus. Normal compressibility and flow on color Doppler imaging. Femoral Vein: No evidence of thrombus. Normal compressibility, respiratory phasicity and response to augmentation. Popliteal Vein: No evidence of thrombus. Normal compressibility, respiratory phasicity and response to augmentation. Calf Veins: No evidence of thrombus. Normal compressibility and flow on color Doppler imaging. Superficial Great Saphenous Vein: No evidence of thrombus. Normal compressibility. Venous Reflux:  None. Other Findings: No evidence of superficial thrombophlebitis or abnormal fluid collection. IMPRESSION: No evidence of deep  venous thrombosis in either lower extremity. Electronically Signed   By: Aletta Edouard M.D.   On: 10/08/2019 10:27   DG CHEST PORT 1 VIEW  Result Date: 10/08/2019 CLINICAL DATA:  Dyspnea, COVID positive EXAM: PORTABLE CHEST 1 VIEW COMPARISON:  Chest radiograph from one day prior. FINDINGS: Intact appearing visualized right sided VP shunt. Partially visualized surgical fixation hardware in the lower cervical spine. Stable cardiomediastinal silhouette with top-normal heart size. No pneumothorax. No pleural effusion. Lungs appear clear, with no acute consolidative airspace disease and no pulmonary edema. IMPRESSION: No active disease. Electronically Signed   By: Ilona Sorrel M.D.   On: 10/08/2019 10:48   DG Chest Port 1 View  Result Date: 10/07/2019 CLINICAL DATA:  EMS reports wife called because pt unable to get up off of couch. Reports when they arrived pt was soaked in urine. Reports recently exposed to someone with covid. Reports cough for " a while." EMS reports fever 101.8. EXAM: PORTABLE CHEST - 1 VIEW COMPARISON:  07/29/2019 FINDINGS: Lungs are clear. Heart size upper limits normal for technique. Tortuous thoracic aorta. No effusion. No pneumothorax. Stable cervical fixation hardware. VP shunt tubing projects over the right hemithorax. IMPRESSION: No acute disease. Electronically Signed   By: Lucrezia Europe M.D.   On: 10/07/2019 10:34    Orson Eva, DO  [facility]  If 7PM-7AM, please contact night-coverage [email or web address] Password [email or web address] 10/10/2019, 3:26 PM   LOS: 3 days

## 2019-10-10 NOTE — TOC Progression Note (Signed)
Transition of Care [facility]) - Progression Note    Patient Details  Name: Albert Hahn MRN: [id number] Date of Birth: 07/14/1946  Transition of Care [facility]) CM/SW Contact  Ihor Gully, LCSW Phone Number: 10/10/2019, 3:51 PM  Clinical Narrative:    Patient has authorization from [place]. Ref #: [id number].  Authorization ID: [id number], for 5 days 10/10/19-10/14/19. Shawn Key is clinical reviewer. Clinicals to be faxed to [phone].    Expected Discharge Plan: [place] Barriers to Discharge: Continued Medical Work up  Expected Discharge Plan and Services Expected Discharge Plan: [place] arrangements for the past 2 months: Single Family Home                                       Social Determinants of Health (SDOH) Interventions    Readmission Risk Interventions No flowsheet data found.

## 2019-10-10 NOTE — Progress Notes (Signed)
Physical Therapy Treatment Patient Details Name: Albert Hahn MRN: [id number] DOB: [birth year] Today's Date: 10/10/2019    History of Present Illness Albert Hahn  is a 74 y.o. male with past medical history relevant for history of VP shunt, PTSD, HTN, with recent COVID-19 exposure who presents to the ED with weakness, fatigue, fevers, tachypnea without hypoxia    PT Comments    Patient requires repeated verbal/tactile cueing to complete functional tasks and exercises, slow labored movement for sitting up at bedside, sit to stands and transferring to chair due to generalized weakness and fatigue.  Patient tolerated sitting up in chair after therapy - RN notified.  Patient will benefit from continued physical therapy in hospital and recommended venue below to increase strength, balance, endurance for safe ADLs and gait.    Follow Up Recommendations  SNF     Equipment Recommendations  None recommended by PT    Recommendations for Other Services       Precautions / Restrictions Precautions Precautions: Fall Restrictions Weight Bearing Restrictions: No    Mobility  Bed Mobility Overal bed mobility: Needs Assistance Bed Mobility: Supine to Sit     Supine to sit: Mod assist     General bed mobility comments: slow labored movement  Transfers Overall transfer level: Needs assistance Equipment used: Rolling walker (2 wheeled) Transfers: Sit to/from [facility] Sit to Stand: Min assist;Mod assist Stand pivot transfers: Mod assist       General transfer comment: increased time, labored movement  Ambulation/Gait Ambulation/Gait assistance: Mod assist Gait Distance (Feet): 6 Feet Assistive device: Rolling walker (2 wheeled) Gait Pattern/deviations: Decreased step length - right;Decreased step length - left;Decreased stride length Gait velocity: decreased   General Gait Details: limited to 6-7 slow labored unsteady steps at bedside due to BLE weakness  and fall risk   Stairs             Wheelchair Mobility    Modified Rankin (Stroke Patients Only)       Balance Overall balance assessment: Needs assistance Sitting-balance support: Feet supported;No upper extremity supported Sitting balance-Leahy Scale: Fair Sitting balance - Comments: fair/good seated at bedside   Standing balance support: During functional activity;Bilateral upper extremity supported Standing balance-Leahy Scale: Poor Standing balance comment: fair/poor using RW                            Cognition Arousal/Alertness: Awake/alert Behavior During Therapy: WFL for tasks assessed/performed Overall Cognitive Status: Within Functional Limits for tasks assessed                                 General Comments: requires repeated verbal/tactile cueing to complete functional tasks      Exercises General Exercises - Lower Extremity Ankle Circles/Pumps: Seated;AROM;Strengthening;Both;10 reps Long Arc Quad: Seated;AAROM;Strengthening;Both;10 reps    General Comments        Pertinent Vitals/Pain Pain Assessment: No/denies pain    Home Living                      Prior Function            PT Goals (current goals can now be found in the care plan section) Acute Rehab PT Goals Patient Stated Goal: return home with family to assist PT Goal Formulation: With patient Time For Goal Achievement: 10/22/19 Potential to Achieve Goals: Good Progress towards PT goals:  Progressing toward goals    Frequency    Min 3X/week      PT Plan      Co-evaluation              AM-PAC PT "6 Clicks" Mobility   Outcome Measure  Help needed turning from your back to your side while in a flat bed without using bedrails?: A Lot Help needed moving from lying on your back to sitting on the side of a flat bed without using bedrails?: A Lot Help needed moving to and from a bed to a chair (including a wheelchair)?: A Lot Help  needed standing up from a chair using your arms (e.g., wheelchair or bedside chair)?: A Lot Help needed to walk in hospital room?: A Lot Help needed climbing 3-5 steps with a railing? : Total 6 Click Score: 11    End of Session   Activity Tolerance: Patient tolerated treatment well;Patient limited by fatigue Patient left: in chair;with call bell/phone within reach;with chair alarm set Nurse Communication: Mobility status PT Visit Diagnosis: Unsteadiness on feet (R26.81);Other abnormalities of gait and mobility (R26.89);Muscle weakness (generalized) (M62.81)     Time: 2229-7989 PT Time Calculation (min) (ACUTE ONLY): 26 min  Charges:  $Therapeutic Exercise: 8-22 mins $Therapeutic Activity: 8-22 mins                     9:57 AM, 10/10/19 Lonell Grandchild, MPT Physical Therapist with [facility] [phone] office [phone] mobile phone

## 2019-10-11 ENCOUNTER — Inpatient Hospital Stay (HOSPITAL_COMMUNITY): Payer: No Typology Code available for payment source

## 2019-10-11 DIAGNOSIS — G9340 Encephalopathy, unspecified: Secondary | ICD-10-CM

## 2019-10-11 DIAGNOSIS — R509 Fever, unspecified: Secondary | ICD-10-CM

## 2019-10-11 LAB — COMPREHENSIVE METABOLIC PANEL
ALT: 55 U/L — ABNORMAL HIGH (ref 0–44)
AST: 84 U/L — ABNORMAL HIGH (ref 15–41)
Albumin: 2.9 g/dL — ABNORMAL LOW (ref 3.5–5.0)
Alkaline Phosphatase: 61 U/L (ref 38–126)
Anion gap: 10 (ref 5–15)
BUN: 28 mg/dL — ABNORMAL HIGH (ref 8–23)
CO2: 18 mmol/L — ABNORMAL LOW (ref 22–32)
Calcium: 8.3 mg/dL — ABNORMAL LOW (ref 8.9–10.3)
Chloride: 113 mmol/L — ABNORMAL HIGH (ref 98–111)
Creatinine, Ser: 1.34 mg/dL — ABNORMAL HIGH (ref 0.61–1.24)
GFR calc Af Amer: >60 mL/min (ref >60)
GFR calc non Af Amer: 53 mL/min — ABNORMAL LOW (ref >60)
Glucose, Bld: 103 mg/dL — ABNORMAL HIGH (ref 70–99)
Potassium: 3.2 mmol/L — ABNORMAL LOW (ref 3.5–5.1)
Sodium: 141 mmol/L (ref 135–145)
Total Bilirubin: 0.6 mg/dL (ref 0.3–1.2)
Total Protein: 6.4 g/dL — ABNORMAL LOW (ref 6.5–8.1)

## 2019-10-11 LAB — CBC WITH DIFFERENTIAL/PLATELET
Abs Immature Granulocytes: 0.03 10*3/uL (ref 0.00–0.07)
Basophils Absolute: 0 10*3/uL (ref 0.0–0.1)
Basophils Relative: 0 %
Eosinophils Absolute: 0 10*3/uL (ref 0.0–0.5)
Eosinophils Relative: 0 %
HCT: 38.4 % — ABNORMAL LOW (ref 39.0–52.0)
Hemoglobin: 12.7 g/dL — ABNORMAL LOW (ref 13.0–17.0)
Immature Granulocytes: 0 %
Lymphocytes Relative: 12 %
Lymphs Abs: 0.9 10*3/uL (ref 0.7–4.0)
MCH: 32.2 pg (ref 26.0–34.0)
MCHC: 33.1 g/dL (ref 30.0–36.0)
MCV: 97.5 fL (ref 80.0–100.0)
Monocytes Absolute: 0.4 10*3/uL (ref 0.1–1.0)
Monocytes Relative: 5 %
Neutro Abs: 6.1 10*3/uL (ref 1.7–7.7)
Neutrophils Relative %: 83 %
Platelets: 181 10*3/uL (ref 150–400)
RBC: 3.94 MIL/uL — ABNORMAL LOW (ref 4.22–5.81)
RDW: 13.4 % (ref 11.5–15.5)
WBC: 7.4 10*3/uL (ref 4.0–10.5)
nRBC: 0 % (ref 0.0–0.2)

## 2019-10-11 LAB — CSF CELL COUNT WITH DIFFERENTIAL
RBC Count, CSF: 13 /mm3 — ABNORMAL HIGH
Tube #: 1
WBC, CSF: 2 /mm3 (ref 0–5)

## 2019-10-11 LAB — C-REACTIVE PROTEIN: CRP: 10 mg/dL — ABNORMAL HIGH (ref <1.0)

## 2019-10-11 LAB — URINALYSIS, COMPLETE (UACMP) WITH MICROSCOPIC
Bacteria, UA: NONE SEEN
Bilirubin Urine: NEGATIVE
Glucose, UA: NEGATIVE mg/dL
Ketones, ur: NEGATIVE mg/dL
Leukocytes,Ua: NEGATIVE
Nitrite: NEGATIVE
Protein, ur: 100 mg/dL — AB
Specific Gravity, Urine: 1.015 (ref 1.005–1.030)
pH: 5 (ref 5.0–8.0)

## 2019-10-11 LAB — FERRITIN: Ferritin: 490 ng/mL — ABNORMAL HIGH (ref 24–336)

## 2019-10-11 LAB — CRYPTOCOCCAL ANTIGEN, CSF: Crypto Ag: NEGATIVE

## 2019-10-11 LAB — PROCALCITONIN: Procalcitonin: 0.28 ng/mL

## 2019-10-11 LAB — MAGNESIUM: Magnesium: 1.9 mg/dL (ref 1.7–2.4)

## 2019-10-11 LAB — D-DIMER, QUANTITATIVE: D-Dimer, Quant: 3.49 ug/mL-FEU — ABNORMAL HIGH (ref 0.00–0.50)

## 2019-10-11 LAB — LACTIC ACID, PLASMA: Lactic Acid, Venous: 1.4 mmol/L (ref 0.5–1.9)

## 2019-10-11 LAB — PHOSPHORUS: Phosphorus: 2.9 mg/dL (ref 2.5–4.6)

## 2019-10-11 LAB — PROTEIN AND GLUCOSE, CSF
Glucose, CSF: 66 mg/dL (ref 40–70)
Total  Protein, CSF: 26 mg/dL (ref 15–45)

## 2019-10-11 LAB — AMMONIA: Ammonia: 18 umol/L (ref 9–35)

## 2019-10-11 MED ORDER — POVIDONE-IODINE 10 % EX SOLN
CUTANEOUS | Status: AC
  Filled 2019-10-11: qty 15

## 2019-10-11 MED ORDER — LIDOCAINE HCL (PF) 1 % IJ SOLN
INTRAMUSCULAR | Status: AC
  Filled 2019-10-11: qty 5

## 2019-10-11 MED ORDER — POTASSIUM CHLORIDE CRYS ER 20 MEQ PO TBCR
20.0000 meq | EXTENDED_RELEASE_TABLET | Freq: Once | ORAL | Status: AC
  Administered 2019-10-11: 20 meq via ORAL
  Filled 2019-10-11: qty 1

## 2019-10-11 NOTE — Care Management Important Message (Signed)
Important Message  Patient Details  Name: Albert Hahn MRN: [id number] Date of Birth: [birth year]   Medicare Important Message Given:  Rogue Jury, RN will deliver letter due to precautions  Tommy Medal 10/11/2019, 2:35 PM

## 2019-10-11 NOTE — Procedures (Signed)
Preprocedure Dx: Fever, encephalopathy Postprocedure Dx: Fever, encephalopathy Procedure:  Fluoroscopically guided lumbar puncture Radiologist:  Thornton Papas Anesthesia:  3 ml of 1% lidocaine Specimen:  9 ml CSF, clear colorless EBL:   < 1 ml Opening pressure: N/A cm O6A Complications: None

## 2019-10-11 NOTE — Progress Notes (Signed)
Pt back in room at this time.

## 2019-10-11 NOTE — Progress Notes (Signed)
PROGRESS NOTE  Albert Hahn WCH:[id number] DOB: 1946-06-16 DOA: 10/07/2019 PCP: Asencion Noble, MD  Brief History: 74 y.o.malewith past medical history relevant for history ofNHL,VP shunt for NPH, PTSD, HTN, with recent COVID-19 exposure who presents to the ED withseveral days of generalizedweakness, fatigue, fevers,cough,tachypnea without hypoxia. Pt received first dose of COVID-19 vaccination about 2 weeks ago. He is due for his second dose on 10/12/19. EMS was activated due to pt having generalized weakness to the point of being unable to get off couch. EMS reports patient was soaked in urine on their arrival has been coughing.He denied f/c cp, sob, n/v/d. In [place] x-ray without acute findings,UA not consistent with UTI,WBCs only 5.9,lactic acid is 0.8 -Procalcitonin 0.13--- , -Influenza negative -CRP is 9.2,D-dimer 6.87(Nohypoxia),LDH elevated at 212,fibrinogen elevated at 576, -Ferritin 238,triglycerides 92,  -Creatinine is 2.1,potassium is also 2.1   Assessment/Plan: SIRS -present on admission -sepsis ruled out -due to COVID-19 infection -d/c vanco/cefepime -10/11/19--developed fever 101.0  -repeat blood culture -2/26-repeat UA--no pyuria -request LP -check PCT--0.28 -check lactate--1.4 -continue IVF  COVID-19 Infection -no hypoxia -personally reviewed CXR--no infiltrates, no edema -CRP 9.2>>>8.1>>>8.1>>8.4 -d-Dimer-6.87>>>5.01>>>3.29>>5.29 -ferritin 238>>265>>287>>418 -PCT 0.13 -LDH 212 -fibrinogen 576 -continue zinc and vitamin c -d/c steroids--currently does not have dyspnea or hypoxia  Acute metabolic Encephalopathy -pt has underlying cognitive impairment -worsen due to infectious process and renal failure and hospital delirium -TSH--1.637 -B12-379 -folate--15.1 -ammonia--41 -TSH--1.637 -remains pleasantly confused -MR brain cannot be obtained due to shunt incompatibility  Acute on chronic renal failure--CKD  3a -baseline creatinine 1.3-1.4 -serum creatinine peaked 2.62>>>1.34 -due to volume depletion -continue IVF-->improved  Cognitive Impairment -07/25/19 note from [facility] states--longstanding left-sided weakness, with associated gait difficulty with 9 falls over the last few months -They also noted hx of irrational thoughts and cognitive changes -need formal neuropsychiatric eval in future once stable  HTN -continue amlodipine, terazosin  Hypokalemia -replete -mag 1.9  Anxiety/Depression/PTSD -continue alprazolam, topiramate, Elavil, temazepam at hs  Stage 1 gluteal pressure ulcer  -present on admission -continue local care     Total time spent 35 minutes. Greater than 50% spent face to face counseling and coordinating care.    Disposition Plan: Patient From:Home D/C Place: SNF -1-2Days Barriers: Not Clinically Stable--spiking fevers--work up in progress  Family Communication:74 y.o.malewith past medical history relevant for history ofNHL,VP shunt for NPH, PTSD, HTN, with recent COVID-19 exposure who presents to the ED withseveral days of generalizedweakness, fatigue, fevers,cough,tachypnea without hypoxia. Pt received first dose of COVID-19 vaccination about 2 weeks ago. He is due for his second dose on 10/12/19. EMS was activated due to pt having generalized weakness to the point of being unable to get off couch. EMS reports patient was soaked in urine on their arrival has been coughing.He denied f/c cp, sob, n/v/d. In [place] x-ray without acute findings,UA not consistent with UTI,WBCs only 5.9,lactic acid is 0.8 -Procalcitonin 0.13--- , -Influenza negative -CRP is 9.2,D-dimer 6.87(Nohypoxia),LDH elevated at 212,fibrinogen elevated at 576, -Ferritin 238,triglycerides 92,  -Creatinine is 2.1,potassium is also 2.1  Assessment/Plan: SIRS -present on admission -sepsis ruled out -due to COVID-19 infection -d/c vanco/cefepime -10/11/19--developed fever 101.0  -repeat blood culture -2/26-repeat UA--no pyuria -request LP -check PCT--0.28 -check lactate--1.4 -continue IVF  COVID-19 Infection -no hypoxia -personally reviewed CXR--no infiltrates, no edema -CRP 9.2>>>8.1>>>8.1>>8.4 -d-Dimer-6.87>>>5.01>>>3.29>>5.29 -ferritin 238>>265>>287>>418 -PCT 0.13 -LDH 212 -fibrinogen 576 -continue zinc and vitamin c -d/c steroids--currently does not have dyspnea or hypoxia  Acute metabolic Encephalopathy -pt has underlying cognitive impairment -worsen due to infectious process and renal failure and hospital delirium -TSH--1.637 -B12-379 -folate--15.1 -ammonia--41 -TSH--1.637 -remains pleasantly confused -MR brain cannot be obtained due to shunt incompatibility  Acute on chronic renal failure--CKD  3a -baseline creatinine 1.3-1.4 -serum creatinine peaked 2.62>>>1.34 -due to volume depletion -continue IVF-->improved  Cognitive Impairment -07/25/19 note from [facility] states--longstanding left-sided weakness, with associated gait difficulty with 9 falls over the last few months -They also noted hx of irrational thoughts and cognitive changes -need formal neuropsychiatric eval in future once stable  HTN -continue amlodipine, terazosin  Hypokalemia -replete -mag 1.9  Anxiety/Depression/PTSD -continue alprazolam, topiramate, Elavil, temazepam at hs  Stage 1 gluteal pressure ulcer  -present on admission -continue local care     Total time spent 35 minutes. Greater than 50% spent face to face counseling and coordinating care.    Disposition Plan: Patient From:Home D/C Place: SNF -1-2Days Barriers: Not Clinically Stable--spiking fevers--work up in progress  Family Communication:Spouse updated 10/11/19  Consultants:none  Code Status: FULL   DVT Prophylaxis: [place] Heparin    Procedures: As Listed in Progress Note Above  Antibiotics: None   Subjective: Pt is pleasantly confused.  Denies cp, sob, abd pain.  Complains of headache.  Denies visual disturbance.  Objective: Vitals:   10/10/19 1400 10/10/19 2100 10/11/19 0500 10/11/19 0600  BP: 138/78 (!) 114/94 (!) 149/93   Pulse: 86 79 93   Resp: 18 20 20    Temp: 99.9 F (37.7 C) 99 F (37.2 C) (!) 101 F (38.3 C) 100 F (37.8 C)  TempSrc: Oral Oral Oral   SpO2:  100% 99%   Weight:      Height:        Intake/Output Summary (Last 24 hours) at 10/11/2019 1011 Last data filed at 10/11/2019 0500 Gross per 1?>0}]}]}}  Consultants:none  Code Status: FULL   DVT Prophylaxis: [place] Heparin    Procedures: As Listed in Progress Note Above  Antibiotics: None   Subjective: Pt is pleasantly confused.  Denies cp, sob, abd pain.  Complains of headache.  Denies visual disturbance.  Objective: Vitals:   10/10/19 1400 10/10/19 2100 10/11/19 0500 10/11/19 0600  BP: 138/78 (!) 114/94 (!) 149/93   Pulse: 86 79 93   Resp: 18 20 20    Temp: 99.9 F (37.7 C) 99 F (37.2 C) (!) 101 F (38.3 C) 100 F (37.8 C)  TempSrc: Oral Oral Oral   SpO2:  100% 99%   Weight:      Height:        Intake/Output Summary (Last 24 hours) at 10/11/2019 1011 Last data filed at 10/11/2019 0500 Gross per 24 hour  Intake 998.76 ml  Output 1400 ml  Net -401.24 ml   Weight change:  Exam:   General:  Pt is alert, follows commands appropriately, not in acute distress  HEENT: No icterus, No thrush, No neck mass, [place]/AT  Cardiovascular: RRR, S1/S2, no rubs, no  gallops  Respiratory: bibasilar crackles. No wheeze. Poor inspiratory  effort  Abdomen: Soft/+BS, non tender, mild distended, no guarding  Extremities: No edema, No lymphangitis, No petechiae, No rashes, no synovitis   Data Reviewed: I have personally reviewed following labs and imaging studies Basic Metabolic Panel: Recent Labs  Lab 10/07/19 1635 10/08/19 0539 10/09/19 0540 10/10/19 0447 10/11/19 0543  NA 134* 138 138 141 141  K 2.6* 3.5 3.0* 4.4 3.2*  CL 102 108 114* 116* 113*  CO2 18* 20* 15* 15* 18*  GLUCOSE 117* 129* 100* 92 103*  BUN 34* 36* 30* 30* 28*  CREATININE 2.02* 1.87* 1.66* 1.49* 1.34*  CALCIUM 7.9* 8.2* 7.9* 8.3* 8.3*  MG 1.9 2.2 1.8 2.5* 1.9  PHOS 3.5 2.5 2.3* 1.9* 2.9   Liver Function Tests: Recent Labs  Lab 10/07/19 1013 10/07/19 1013 10/07/19 1635 10/08/19 0539 10/09/19 0540 10/10/19 0447 10/11/19 0543  AST 62*  --   --  50* 46* 62* 84*  ALT 36  --   --  34 33 32 55*  ALKPHOS 83  --   --  72 63 63 61  BILITOT 0.7  --   --  0.5 0.4 1.6* 0.6  PROT 7.2  --   --  6.4* 6.1* 6.7 6.4*  ALBUMIN 3.7   < > 3.4* 3.1* 2.9* 3.0* 2.9*   < > = values in this interval not displayed.   No results for input(s): LIPASE, AMYLASE in the last 168 hours. Recent Labs  Lab 10/10/19 0447 10/11/19 0543  AMMONIA 41* 18   Coagulation Profile: Recent Labs  Lab 10/07/19 1040  INR 1.0   CBC: Recent Labs  Lab 10/07/19 1013 10/08/19 0539 10/09/19 0540 10/10/19 0447 10/11/19 0543  WBC 5.9 6.0 5.7 6.2 7.4  NEUTROABS 4.2 4.6 4.1 4.8 6.1  HGB 15.3 13.5 12.6* 13.7 12.7*  HCT 44.2 39.6 38.0* 42.0 38.4*  MCV 92.5 95.0 95.5 98.6 97.5  PLT 189 170 157 70* 181   Cardiac Enzymes: No results for input(s): CKTOTAL, CKMB, CKMBINDEX, TROPONINI in the last 168 hours. BNP: Invalid input(s): POCBNP CBG: No results for input(s): GLUCAP in the last 168 hours. HbA1C: No results for input(s): HGBA1C in the last 72 hours. Urine analysis:    Component Value Date/Time    COLORURINE YELLOW 10/11/2019 0952   APPEARANCEUR HAZY (A) 10/11/2019 0952   LABSPEC 1.015 10/11/2019 0952   LABSPEC 1.025 09/06/2006 1104   PHURINE 5.0 10/11/2019 0952   GLUCOSEU NEGATIVE 10/11/2019 0952   HGBUR MODERATE (A) 10/11/2019 0952   BILIRUBINUR NEGATIVE 10/11/2019 0952   BILIRUBINUR Negative 09/06/2006 1104   KETONESUR NEGATIVE 10/11/2019 0952   PROTEINUR 100 (A) 10/11/2019 0952   UROBILINOGEN 1.0 08/20/2008 1340   NITRITE NEGATIVE 10/11/2019 0952   LEUKOCYTESUR NEGATIVE 10/11/2019 0952   LEUKOCYTESUR Trace 09/06/2006 1104   Sepsis Labs: @LABRCNTIP (procalcitonin:4,lacticidven:4) ) Recent Results (from the past 240 hour(s))  Blood Culture (routine x 2)     Status: None (Preliminary result)   Collection Time: 10/07/19 10:13 AM   Specimen: BLOOD LEFT ARM  Result Value Ref Range Status   Specimen Description BLOOD LEFT ARM  Final   Special Requests   Final    BOTTLES DRAWN AEROBIC ONLY Blood Culture adequate volume   Culture   Final    NO GROWTH 4 DAYS Performed at [facility], [address] [place], [place] [address]    Report Status PENDING  Incomplete  Urine Culture     Status: None   Collection Time: 10/07/19 10:13 AM   Specimen: Urine,  Clean Catch  Result Value Ref Range Status   Specimen Description   Final    URINE, CLEAN CATCH Performed at [facility], [address] [place], [place] [address]    Special Requests   Final    NONE Performed at [facility], [address], [place], [place] [address]    Culture   Final    NO GROWTH Performed at [facility], [place] [address] [place], [place] [address]    Report Status 10/08/2019 FINAL  Final  Blood Culture (routine x 2)     Status: None (Preliminary result)   Collection Time: 10/07/19 10:18 AM   Specimen: BLOOD LEFT HAND  Result Value Ref Range Status   Specimen Description BLOOD LEFT HAND  Final   Special Requests   Final    BOTTLES DRAWN AEROBIC ONLY Blood Culture results may not be optimal  due to an inadequate volume of blood received in culture bottles   Culture   Final    NO GROWTH 4 DAYS Performed at [facility], [address], [place], [place] [address]    Report Status PENDING  Incomplete  Culture, blood (Routine X 2) w Reflex to ID Panel     Status: None (Preliminary result)   Collection Time: 10/11/19  9:32 AM   Specimen: Left Antecubital; Blood  Result Value Ref Range Status   Specimen Description   Final    LEFT ANTECUBITAL BOTTLES DRAWN AEROBIC AND ANAEROBIC   Special Requests   Final    Blood Culture adequate volume Performed at [facility], [address] [place], [place] [address]    Culture PENDING  Incomplete   Report Status PENDING  Incomplete  Culture, blood (Routine X 2) w Reflex to ID Panel     Status: None (Preliminary result)   Collection Time: 10/11/19  9:33 AM   Specimen: BLOOD LEFT ARM  Result Value Ref Range Status   Specimen Description BLOOD LEFT ARM BOTTLES DRAWN AEROBIC AND ANAEROBIC  Final   Special Requests   Final    Blood Culture adequate volume Performed at [facility], [address], [place], [place] [address]    Culture PENDING  Incomplete   Report Status PENDING  Incomplete     Scheduled Meds: . amitriptyline  50 mg Oral QHS  . amLODipine  10 mg Oral Daily  . vitamin C  500 mg Oral Daily  . dexamethasone  6 mg Oral Daily  . folic acid  1 mg Oral Daily  . heparin  5,000 Units Subcutaneous Q8H  . multivitamin with minerals  1 tablet Oral Daily  . phosphorus  500 mg Oral BID  . polyethylene glycol  17 g Oral Daily  . polyvinyl alcohol  1-2 drop Both Eyes Daily  . senna-docusate  2 tablet Oral QHS  . sodium chloride flush  3 mL Intravenous Q12H  . terazosin  4 mg Oral QHS  . thiamine  100 mg Oral Daily  . topiramate  100 mg Oral Daily  . zinc sulfate  220 mg Oral Daily   Continuous Infusions: . sodium chloride    . sodium chloride 75 mL/hr at 10/11/19 6503    Procedures/Studies: CT HEAD WO CONTRAST  Result  Date: 10/10/2019 CLINICAL DATA:  Encephalopathy EXAM: CT HEAD WITHOUT CONTRAST TECHNIQUE: Contiguous axial images were obtained from the base of the skull through the vertex without intravenous contrast. COMPARISON:  10/07/2019 FINDINGS: Brain: Right frontal approach shunt catheter is stable in position. Discontinued right  parietal approach shunt catheter again noted. Ventricle caliber is stable. There is no acute intracranial hemorrhage or mass effect. There is no new loss of gray-white differentiation. Nonspecific primarily periventricular white matter hypoattenuation is unchanged. Vascular: No hyperdense vessel. Skull: Calvarium is unremarkable apart from burr holes. Sinuses/Orbits: No new finding. Chronic sphenoid sinusitis is again noted. Other: None. IMPRESSION: No acute abnormality. Stable appearance compared to 10/07/2019 examination. Electronically Signed   By: Macy Mis M.D.   On: 10/10/2019 17:12   CT Head Wo Contrast  Result Date: 10/07/2019 CLINICAL DATA:  Encephalopathy, VP shunt EXAM: CT HEAD WITHOUT CONTRAST TECHNIQUE: Contiguous axial images were obtained from the base of the skull through the vertex without intravenous contrast. COMPARISON:  01/06/2017 FINDINGS: Brain: Right frontal approach shunt catheter is new from the prior study and terminates near midline within the interhemispheric fissure. Right parietal approach catheter has been discontinued. Ventricle caliber is similar. There is no acute intracranial hemorrhage or mass effect. No new loss of gray differentiation. Nonspecific primarily periventricular white matter hypoattenuation. Vascular: No hyperdense vessel. Skull: Unremarkable apart from burr holes. Sinuses/Orbits: Patchy mucosal thickening with chronic sphenoid sinusitis. Other: Mastoid air cells are clear. IMPRESSION: Interval placement of right frontal approach shunt catheter. Similar enlargement of the third and lateral ventricles. No acute intracranial hemorrhage, mass  effect, or evidence of acute infarction. Electronically Signed   By: Macy Mis M.D.   On: 10/07/2019 11:16   US Venous Img Lower Bilateral (DVT)  Result Date: 10/08/2019 CLINICAL DATA:  Concern for pulmonary embolism. Bilateral lower extremity edema. EXAM: BILATERAL LOWER EXTREMITY VENOUS DOPPLER ULTRASOUND TECHNIQUE: Gray-scale sonography with graded compression, as well as color Doppler and duplex ultrasound were performed to evaluate the lower extremity deep venous systems from the level of the common femoral vein and including the common femoral, femoral, profunda femoral, popliteal and calf veins including the posterior tibial, peroneal and gastrocnemius veins when visible. The superficial great saphenous vein was also interrogated. Spectral Doppler was utilized to evaluate flow at rest and with distal augmentation maneuvers in the common femoral, femoral and popliteal veins. COMPARISON:  None. FINDINGS: RIGHT LOWER EXTREMITY Common Femoral Vein: No evidence of thrombus. Normal compressibility, respiratory phasicity and response to augmentation. Saphenofemoral Junction: No evidence of thrombus. Normal compressibility and flow on color Doppler imaging. Profunda Femoral Vein: No evidence of thrombus. Normal compressibility and flow on color Doppler imaging. Femoral Vein: No evidence of thrombus. Normal compressibility, respiratory phasicity and response to augmentation. Popliteal Vein: No evidence of thrombus. Normal compressibility, respiratory phasicity and response to augmentation. Calf Veins: No evidence of thrombus. Normal compressibility and flow on color Doppler imaging. Superficial Great Saphenous Vein: No evidence of thrombus. Normal compressibility. Venous Reflux:  None. Other Findings: No evidence of superficial thrombophlebitis or abnormal fluid collection. LEFT LOWER EXTREMITY Common Femoral Vein: No evidence of thrombus. Normal compressibility, respiratory phasicity and response to  augmentation. Saphenofemoral Junction: No evidence of thrombus. Normal compressibility and flow on color Doppler imaging. Profunda Femoral Vein: No evidence of thrombus. Normal compressibility and flow on color Doppler imaging. Femoral Vein: No evidence of thrombus. Normal compressibility, respiratory phasicity and response to augmentation. Popliteal Vein: No evidence of thrombus. Normal compressibility, respiratory phasicity and response to augmentation. Calf Veins: No evidence of thrombus. Normal compressibility and flow on color Doppler imaging. Superficial Great Saphenous Vein: No evidence of thrombus. Normal compressibility. Venous Reflux:  None. Other Findings: No evidence of superficial thrombophlebitis or abnormal fluid collection. IMPRESSION: No evidence of deep venous thrombosis in  either lower extremity. Electronically Signed   By: Aletta Edouard M.D.   On: 10/08/2019 10:27   DG CHEST PORT 1 VIEW  Result Date: 10/08/2019 CLINICAL DATA:  Dyspnea, COVID positive EXAM: PORTABLE CHEST 1 VIEW COMPARISON:  Chest radiograph from one day prior. FINDINGS: Intact appearing visualized right sided VP shunt. Partially visualized surgical fixation hardware in the lower cervical spine. Stable cardiomediastinal silhouette with top-normal heart size. No pneumothorax. No pleural effusion. Lungs appear clear, with no acute consolidative airspace disease and no pulmonary edema. IMPRESSION: No active disease. Electronically Signed   By: Ilona Sorrel M.D.   On: 10/08/2019 10:48   DG Chest Port 1 View  Result Date: 10/07/2019 CLINICAL DATA:  EMS reports wife called because pt unable to get up off of couch. Reports when they arrived pt was soaked in urine. Reports recently exposed to someone with covid. Reports cough for " a while." EMS reports fever 101.8. EXAM: PORTABLE CHEST - 1 VIEW COMPARISON:  07/29/2019 FINDINGS: Lungs are clear. Heart size upper limits normal for technique. Tortuous thoracic aorta. No effusion.  No pneumothorax. Stable cervical fixation hardware. VP shunt tubing projects over the right hemithorax. IMPRESSION: No acute disease. Electronically Signed   By: Lucrezia Europe M.D.   On: 10/07/2019 10:34   US Abdomen Limited RUQ  Result Date: 10/10/2019 CLINICAL DATA:  Elevated liver enzymes.  Abdominal pain. EXAM: ULTRASOUND ABDOMEN LIMITED RIGHT UPPER QUADRANT COMPARISON:  CT, 04/26/2017. FINDINGS: Gallbladder: No gallstones or wall thickening visualized. No sonographic Murphy sign noted by sonographer. Common bile duct: Diameter: 3 mm Liver: Mild increased parenchymal echogenicity. Normal in size. No mass or focal lesion. Portal vein is patent on color Doppler imaging with normal direction of blood flow towards the liver. Other: None. IMPRESSION: 1. No acute findings.  Normal gallbladder.  No bile duct dilation. 2. Mild increased liver parenchymal echogenicity suggests hepatic steatosis. Electronically Signed   By: Lajean Manes M.D.   On: 10/10/2019 16:47    Orson Eva, DO  [facility]  If 7PM-7AM, please contact night-coverage [email or web address] Password [email or web address] 10/11/2019, 10:11 AM   LOS: 4 days

## 2019-10-11 NOTE — Progress Notes (Signed)
Pt off floor to ordered LP in radiology via hospital bed.  Fluids medlocked for transfer.

## 2019-10-12 LAB — URINE CULTURE: Culture: <10000 — AB

## 2019-10-12 LAB — CBC WITH DIFFERENTIAL/PLATELET
Abs Immature Granulocytes: 0.06 10*3/uL (ref 0.00–0.07)
Basophils Absolute: 0 10*3/uL (ref 0.0–0.1)
Basophils Relative: 0 %
Eosinophils Absolute: 0 10*3/uL (ref 0.0–0.5)
Eosinophils Relative: 0 %
HCT: 39.1 % (ref 39.0–52.0)
Hemoglobin: 12.9 g/dL — ABNORMAL LOW (ref 13.0–17.0)
Immature Granulocytes: 1 %
Lymphocytes Relative: 9 %
Lymphs Abs: 0.7 10*3/uL (ref 0.7–4.0)
MCH: 31.6 pg (ref 26.0–34.0)
MCHC: 33 g/dL (ref 30.0–36.0)
MCV: 95.8 fL (ref 80.0–100.0)
Monocytes Absolute: 0.4 10*3/uL (ref 0.1–1.0)
Monocytes Relative: 4 %
Neutro Abs: 7.4 10*3/uL (ref 1.7–7.7)
Neutrophils Relative %: 86 %
Platelets: 197 10*3/uL (ref 150–400)
RBC: 4.08 MIL/uL — ABNORMAL LOW (ref 4.22–5.81)
RDW: 13.5 % (ref 11.5–15.5)
WBC: 8.6 10*3/uL (ref 4.0–10.5)
nRBC: 0 % (ref 0.0–0.2)

## 2019-10-12 LAB — COMPREHENSIVE METABOLIC PANEL
ALT: 128 U/L — ABNORMAL HIGH (ref 0–44)
AST: 145 U/L — ABNORMAL HIGH (ref 15–41)
Albumin: 2.8 g/dL — ABNORMAL LOW (ref 3.5–5.0)
Alkaline Phosphatase: 70 U/L (ref 38–126)
Anion gap: 9 (ref 5–15)
BUN: 30 mg/dL — ABNORMAL HIGH (ref 8–23)
CO2: 19 mmol/L — ABNORMAL LOW (ref 22–32)
Calcium: 8.5 mg/dL — ABNORMAL LOW (ref 8.9–10.3)
Chloride: 114 mmol/L — ABNORMAL HIGH (ref 98–111)
Creatinine, Ser: 1.3 mg/dL — ABNORMAL HIGH (ref 0.61–1.24)
GFR calc Af Amer: >60 mL/min (ref >60)
GFR calc non Af Amer: 55 mL/min — ABNORMAL LOW (ref >60)
Glucose, Bld: 111 mg/dL — ABNORMAL HIGH (ref 70–99)
Potassium: 3.3 mmol/L — ABNORMAL LOW (ref 3.5–5.1)
Sodium: 142 mmol/L (ref 135–145)
Total Bilirubin: 0.6 mg/dL (ref 0.3–1.2)
Total Protein: 6.3 g/dL — ABNORMAL LOW (ref 6.5–8.1)

## 2019-10-12 LAB — CULTURE, BLOOD (ROUTINE X 2)
Culture: NO GROWTH
Culture: NO GROWTH
Special Requests: ADEQUATE

## 2019-10-12 LAB — MAGNESIUM: Magnesium: 2 mg/dL (ref 1.7–2.4)

## 2019-10-12 LAB — PHOSPHORUS: Phosphorus: 3 mg/dL (ref 2.5–4.6)

## 2019-10-12 LAB — C-REACTIVE PROTEIN: CRP: 11.8 mg/dL — ABNORMAL HIGH (ref <1.0)

## 2019-10-12 LAB — D-DIMER, QUANTITATIVE: D-Dimer, Quant: 9.07 ug/mL-FEU — ABNORMAL HIGH (ref 0.00–0.50)

## 2019-10-12 LAB — FERRITIN: Ferritin: 854 ng/mL — ABNORMAL HIGH (ref 24–336)

## 2019-10-12 MED ORDER — POTASSIUM CHLORIDE CRYS ER 20 MEQ PO TBCR
20.0000 meq | EXTENDED_RELEASE_TABLET | Freq: Once | ORAL | Status: AC
  Administered 2019-10-12: 20 meq via ORAL
  Filled 2019-10-12: qty 1

## 2019-10-12 MED ORDER — POTASSIUM CHLORIDE IN NACL 20-0.9 MEQ/L-% IV SOLN: INTRAVENOUS | Status: DC

## 2019-10-12 NOTE — Progress Notes (Signed)
PROGRESS NOTE  Albert Hahn TDS:[id number] DOB: 1946/05/02 DOA: 10/07/2019 PCP: Asencion Noble, MD   Brief History: 74 y.o.malewith past medical history relevant for history ofNHL,VP shunt for NPH, PTSD, HTN, with recent COVID-19 exposure who presents to the ED withseveral days of generalizedweakness, fatigue, fevers,cough,tachypnea without hypoxia. Pt received first dose of COVID-19 vaccination about 2 weeks ago. He is due for his second dose on 10/12/19. EMS was activated due to pt having generalized weakness to the point of being unable to get off couch. EMS reports patient was soaked in urine on their arrival has been coughing.He denied f/c cp, sob, n/v/d. In [facility] x-ray without acute findings,UA not consistent with UTI,WBCs only 5.9,lactic acid is 0.8 -Procalcitonin 0.13--- , -Influenza negative -CRP is 9.2,D-dimer 6.87(Nohypoxia),LDH elevated at 212,fibrinogen elevated at 576, -Ferritin 238,triglycerides 92,  -Creatinine is 2.1,potassium is also 2.1 -he was started on IVF with improvement of his renal function back to baseline -his hospitalization was complicated by fever.  Repeat cultures and LP were unremarkable.  He remained hemodynamically stable off antibiotics   Assessment/Plan: SIRS -present on admission -sepsis ruled out -due to COVID-19 infection -d/c vanco/cefepime -10/11/19--developed fever 101.0  -repeat blood culture--neg to date -2/26-repeat UA--no pyuria -request LP--WBC =2, protein 26, glu 66, culture neg -check PCT--0.28 -check lactate--1.4 -continue IVF -2/26 CT chest--Bilateral peripheral predominant areas of ground-glass attenuation involving the upper and lower lung zones compatible with atypical pneumonia -2/26 CT abd/pelvis--no acute findings  COVID-19 Infection -no hypoxia -personally reviewed CXR--no infiltrates, no edema -CRP 9.2>>>8.1>>>8.1>>8.4 -d-Dimer-6.87>>>5.01>>>3.29>>5.29 -ferritin  238>>265>>287>>418 -PCT 0.13>>0.28 -LDH 212 -fibrinogen 576 -continue zinc and vitamin c  Acute metabolic Encephalopathy -pt has underlying cognitive impairment -worsen due to infectious process and renal failure and hospital delirium -TSH--1.637 -B12-379 -folate--15.1 -ammonia--41 -TSH--1.637 -remains pleasantly confused -MR brain cannot be obtained due to shunt incompatibility  Acute on chronic renal failure--CKD 3a -baseline creatinine 1.3-1.4 -serum creatinine peaked 2.62>>>1.30 -due to volume depletion -continue IVF-->improved  Cognitive Impairment -07/25/19 note from [facility] states--longstanding left-sided weakness, with associated gait difficulty with 9 falls over the last few months -They also noted hx of irrational thoughts and cognitive changes -need formal neuropsychiatric eval in future once stable  HTN -continue amlodipine, terazosin  Hypokalemia -replete -mag 1.9  Anxiety/Depression/PTSD -continue alprazolam, topiramate, Elavil, at hs  Stage 1 gluteal pressure ulcer  -present on admission -continue local care        Disposition Plan: Patient From:Home D/C Place: SNF -2/28 if afebrile and stable Barriers: Not Clinically Stable--spiking fevers--work up in progress  Family Communication:Spouseupdated 10/12/19  Consultants:none  Code Status: FULL   DVT Prophylaxis: [place] Heparin    Procedures: As Listed in Progress Note Above  Antibiotics: None     Subjective: Pt is more alert today.  Patient denies fevers, chills, headache, chest pain, dyspnea, nausea, vomiting, diarrhea, abdominal pain, dysuria   Objective: Vitals:   10/11/19 2042 10/11/19 2145 10/12/19 0556 10/12/19 1300  BP:  122/62 (!) 142/74 121/69  Pulse: 78 72 82 66  Resp: 16   (!) 21  Temp:  97.9 F (36.6 C) 100.1 F (37.8 C) 98.9 F (37.2 C)  TempSrc:   Oral Oral  SpO2: 94% 99% 100% 96%  Weight:      Height:        Intake/Output  Summary (Last 24 hours) at 10/12/2019 1652 Last data filed at 10/12/2019 1452 Gross per 24 hour  Intake 555 ml  Output 2675 ml  Net -  2120 ml   Weight change:  Exam:   General:  Pt is alert, follows commands appropriately, not in acute distress  HEENT: No icterus, No thrush, No neck mass, [place]/AT  Cardiovascular: RRR, S1/S2, no rubs, no gallops  Respiratory: CTA bilaterally, no wheezing, no crackles, no rhonchi  Abdomen: Soft/+BS, non tender, non distended, no guarding  Extremities: No edema, No lymphangitis, No petechiae, No rashes, no synovitis   Data Reviewed: I have personally reviewed following labs and imaging studies Basic Metabolic Panel: Recent Labs  Lab 10/08/19 0539 10/09/19 0540 10/10/19 0447 10/11/19 0543 10/12/19 0801  NA 138 138 141 141 142  K 3.5 3.0* 4.4 3.2* 3.3*  CL 108 114* 116* 113* 114*  CO2 20* 15* 15* 18* 19*  GLUCOSE 129* 100* 92 103* 111*  BUN 36* 30* 30* 28* 30*  CREATININE 1.87* 1.66* 1.49* 1.34* 1.30*  CALCIUM 8.2* 7.9* 8.3* 8.3* 8.5*  MG 2.2 1.8 2.5* 1.9 2.0  PHOS 2.5 2.3* 1.9* 2.9 3.0   Liver Function Tests: Recent Labs  Lab 10/08/19 0539 10/09/19 0540 10/10/19 0447 10/11/19 0543 10/12/19 0801  AST 50* 46* 62* 84* 145*  ALT 34 33 32 55* 128*  ALKPHOS 72 63 63 61 70  BILITOT 0.5 0.4 1.6* 0.6 0.6  PROT 6.4* 6.1* 6.7 6.4* 6.3*  ALBUMIN 3.1* 2.9* 3.0* 2.9* 2.8*   No results for input(s): LIPASE, AMYLASE in the last 168 hours. Recent Labs  Lab 10/10/19 0447 10/11/19 0543  AMMONIA 41* 18   Coagulation Profile: Recent Labs  Lab 10/07/19 1040  INR 1.0   CBC: Recent Labs  Lab 10/08/19 0539 10/09/19 0540 10/10/19 0447 10/11/19 0543 10/12/19 0801  WBC 6.0 5.7 6.2 7.4 8.6  NEUTROABS 4.6 4.1 4.8 6.1 7.4  HGB 13.5 12.6* 13.7 12.7* 12.9*  HCT 39.6 38.0* 42.0 38.4* 39.1  MCV 95.0 95.5 98.6 97.5 95.8  PLT 170 157 70* 181 197   Cardiac Enzymes: No results for input(s): CKTOTAL, CKMB, CKMBINDEX, TROPONINI in the last 168  hours. BNP: Invalid input(s): POCBNP CBG: No results for input(s): GLUCAP in the last 168 hours. HbA1C: No results for input(s): HGBA1C in the last 72 hours. Urine analysis:    Component Value Date/Time   COLORURINE YELLOW 10/11/2019 0952   APPEARANCEUR HAZY (A) 10/11/2019 0952   LABSPEC 1.015 10/11/2019 0952   LABSPEC 1.025 09/06/2006 1104   PHURINE 5.0 10/11/2019 0952   GLUCOSEU NEGATIVE 10/11/2019 0952   HGBUR MODERATE (A) 10/11/2019 0952   BILIRUBINUR NEGATIVE 10/11/2019 0952   BILIRUBINUR Negative 09/06/2006 [place] 10/11/2019 0952   PROTEINUR 100 (A) 10/11/2019 0952   UROBILINOGEN 1.0 08/20/2008 1340   NITRITE NEGATIVE 10/11/2019 0952   LEUKOCYTESUR NEGATIVE 10/11/2019 0952   LEUKOCYTESUR Trace 09/06/2006 1104   Sepsis Labs: @LABRCNTIP (procalcitonin:4,lacticidven:4) ) Recent Results (from the past 240 hour(s))  Blood Culture (routine x 2)     Status: None   Collection Time: 10/07/19 10:13 AM   Specimen: BLOOD LEFT ARM  Result Value Ref Range Status   Specimen Description BLOOD LEFT ARM  Final   Special Requests   Final    BOTTLES DRAWN AEROBIC ONLY Blood Culture adequate volume   Culture   Final    NO GROWTH 5 DAYS Performed at [facility], [address] [place], [place] [address]    Report Status 10/12/2019 FINAL  Final  Urine Culture     Status: None   Collection Time: 10/07/19 10:13 AM   Specimen: Urine, Clean Catch  Result Value Ref Range Status   Specimen Description   Final    URINE, CLEAN CATCH Performed at [facility], [address], [place], [place] [address]    Special Requests   Final    NONE Performed at [facility], [address], [place], [place] [address]    Culture   Final    NO GROWTH Performed at [facility], [place] [address], [place], [place] [address]    Report Status 10/08/2019 FINAL  Final  Blood Culture (routine x 2)     Status: None   Collection Time: 10/07/19 10:18 AM   Specimen: BLOOD LEFT HAND    Result Value Ref Range Status   Specimen Description BLOOD LEFT HAND  Final   Special Requests   Final    BOTTLES DRAWN AEROBIC ONLY Blood Culture results may not be optimal due to an inadequate volume of blood received in culture bottles   Culture   Final    NO GROWTH 5 DAYS Performed at [facility], [address] [place], [place] [address]    Report Status 10/12/2019 FINAL  Final  Culture, blood (Routine X 2) w Reflex to ID Panel     Status: None (Preliminary result)   Collection Time: 10/11/19  9:32 AM   Specimen: Left Antecubital; Blood  Result Value Ref Range Status   Specimen Description   Final    LEFT ANTECUBITAL BOTTLES DRAWN AEROBIC AND ANAEROBIC   Special Requests Blood Culture adequate volume  Final   Culture   Final    NO GROWTH < 24 HOURS Performed at [facility], [address] [place], [place] [address]    Report Status PENDING  Incomplete  Culture, blood (Routine X 2) w Reflex to ID Panel     Status: None (Preliminary result)   Collection Time: 10/11/19  9:33 AM   Specimen: BLOOD LEFT ARM  Result Value Ref Range Status   Specimen Description BLOOD LEFT ARM BOTTLES DRAWN AEROBIC AND ANAEROBIC  Final   Special Requests Blood Culture adequate volume  Final   Culture   Final    NO GROWTH < 24 HOURS Performed at [facility], [address] [place], [place] [address]    Report Status PENDING  Incomplete  Culture, Urine     Status: Abnormal   Collection Time: 10/11/19  9:52 AM   Specimen: Urine, Clean Catch  Result Value Ref Range Status   Specimen Description   Final    URINE, CLEAN CATCH Performed at [facility], [address] [place], [place] [address]    Special Requests   Final    NONE Performed at [facility], [address], [place], [place] [address]    Culture (A)  Final    <10,000 COLONIES/mL INSIGNIFICANT GROWTH Performed at [facility], [place] [address], [place], [place] [address]    Report Status 10/12/2019 FINAL  Final  CSF culture  with Stat gram stain     Status: None (Preliminary result)   Collection Time: 10/11/19  1:22 PM   Specimen: CSF; Cerebrospinal Fluid  Result Value Ref Range Status   Specimen Description   Final    CSF Performed at [facility], [address] [place], [place] [address]    Special Requests   Final    NONE Performed at [facility], [address], [place], [place] [address]    Gram Stain   Final    NO ORGANISMS SEEN WBC PRESENT, PREDOMINANTLY MONONUCLEAR CYTOSPIN  SMEAR Performed at [facility], [address], [place], [place] [address]    Culture   Final    NO GROWTH < 24 HOURS Performed at [facility] [address], [place], [place] [address]    Report Status PENDING  Incomplete     Scheduled Meds:  amitriptyline  50 mg Oral QHS   amLODipine  10 mg Oral Daily   vitamin C  500 mg Oral Daily   dexamethasone  6 mg Oral Daily   folic acid  1 mg Oral Daily   heparin  5,000 Units Subcutaneous Q8H   multivitamin with minerals  1 tablet Oral Daily   phosphorus  500 mg Oral BID   polyethylene glycol  17 g Oral Daily   polyvinyl alcohol  1-2 drop Both Eyes Daily   senna-docusate  2 tablet Oral QHS   sodium chloride flush  3 mL Intravenous Q12H   terazosin  4 mg Oral QHS   thiamine  100 mg Oral Daily   topiramate  100 mg Oral Daily   zinc sulfate  220 mg Oral Daily   Continuous Infusions:  sodium chloride      Procedures/Studies: CT ABDOMEN PELVIS WO CONTRAST  Result Date: 10/11/2019 CLINICAL DATA:  Cough, dyspnea. COVID-19 positive. Abdominal distension. Remote history of lymphoma. EXAM: CT CHEST, ABDOMEN AND PELVIS WITHOUT CONTRAST TECHNIQUE: Multidetector CT imaging of the chest, abdomen and pelvis was performed following the standard protocol without IV contrast. COMPARISON:  CT AP 04/26/2017 in CT chest 12/22/2011. FINDINGS: CT CHEST FINDINGS Cardiovascular: The heart size is normal. Aortic atherosclerosis. Lad, left circumflex and RCA coronary  artery calcifications. Mediastinum/Nodes: Normal appearance of the thyroid gland. The trachea appears patent and is midline. No enlarged mediastinal or hilar lymph nodes. No axillary or supraclavicular adenopathy. Lungs/Pleura: Exam detail is diminished due to motion artifact. No pleural effusions. Patchy area of ground-glass attenuation is identified within the lateral left lower lobe, image 92/3. Mild bilateral peripheral areas of ground-glass attenuation overlying the right upper lobe posterior lower lobes and subpleural medial left upper lobe noted. No airspace consolidation, atelectasis or pneumothorax. Musculoskeletal: No chest wall mass or suspicious bone lesions identified. CT ABDOMEN PELVIS FINDINGS Hepatobiliary: No focal liver abnormality is seen. No gallstones, gallbladder wall thickening, or biliary dilatation. Pancreas: Unremarkable. No pancreatic ductal dilatation or surrounding inflammatory changes. Spleen: Normal in size without focal abnormality. Adrenals/Urinary Tract: Normal adrenal glands. No kidney mass or hydronephrosis. Urinary bladder normal. Stomach/Bowel: Small hiatal hernia. The stomach is nondistended. There are no dilated loops of small or large bowel. No bowel wall thickening, inflammation or distension. The appendix is not confidently identified. Vascular/Lymphatic: Aortic atherosclerosis. No aneurysm. No abdominopelvic adenopathy. Reproductive: Prostate is unremarkable. Other: Fat containing left inguinal hernia noted. Right-sided ventriculoperitoneal shunt tube is identified which terminates in the upper abdomen. Musculoskeletal: Sclerotic appearing L3 vertebral body is again noted with changes compatible with previous kyphoplasty. IMPRESSION: 1. Bilateral peripheral predominant areas of ground-glass attenuation involving the upper and lower lung zones compatible with atypical pneumonia. No lobar consolidation, atelectasis or pneumothorax. 2. No acute findings within the abdomen or  pelvis 3. Small hiatal hernia 4. Coronary artery calcifications Aortic Atherosclerosis (ICD10-I70.0). Electronically Signed   By: Kerby Moors M.D.   On: 10/11/2019 14:26   CT HEAD WO CONTRAST  Result Date: 10/10/2019 CLINICAL DATA:  Encephalopathy EXAM: CT HEAD WITHOUT CONTRAST TECHNIQUE: Contiguous axial images were obtained from the base of the skull through the vertex without intravenous contrast. COMPARISON:  10/07/2019 FINDINGS: Brain: Right frontal approach shunt catheter is stable in position. Discontinued right parietal approach shunt catheter again noted. Ventricle caliber is stable. There is no acute intracranial hemorrhage or mass effect. There is no new loss of gray-white differentiation. Nonspecific primarily periventricular white matter hypoattenuation is unchanged. Vascular: No hyperdense vessel. Skull: Calvarium is unremarkable apart from burr holes. Sinuses/Orbits: No new finding. Chronic sphenoid sinusitis is again noted. Other: None. IMPRESSION: No acute abnormality. Stable appearance compared to 10/07/2019 examination. Electronically Signed   By: Macy Mis M.D.   On: 10/10/2019 17:12   CT Head Wo Contrast  Result Date: 10/07/2019 CLINICAL DATA:  Encephalopathy, VP shunt EXAM: CT HEAD WITHOUT CONTRAST TECHNIQUE: Contiguous axial images were obtained from the base of the skull through the vertex without intravenous contrast. COMPARISON:  01/06/2017 FINDINGS: Brain: Right frontal approach shunt catheter is new from the prior study and terminates near midline within the interhemispheric fissure. Right parietal approach catheter has been discontinued. Ventricle caliber is similar. There is no acute intracranial hemorrhage or mass effect. No new loss of gray differentiation. Nonspecific primarily periventricular white matter hypoattenuation. Vascular: No hyperdense vessel. Skull: Unremarkable apart from burr holes. Sinuses/Orbits: Patchy mucosal thickening with chronic sphenoid  sinusitis. Other: Mastoid air cells are clear. IMPRESSION: Interval placement of right frontal approach shunt catheter. Similar enlargement of the third and lateral ventricles. No acute intracranial hemorrhage, mass effect, or evidence of acute infarction. Electronically Signed   By: Macy Mis M.D.   On: 10/07/2019 11:16   CT CHEST WO CONTRAST  Result Date: 10/11/2019 CLINICAL DATA:  Cough, dyspnea. COVID-19 positive. Abdominal distension. Remote history of lymphoma. EXAM: CT CHEST, ABDOMEN AND PELVIS WITHOUT CONTRAST TECHNIQUE: Multidetector CT imaging of the chest, abdomen and pelvis was performed following the standard protocol without IV contrast. COMPARISON:  CT AP 04/26/2017 in CT chest 12/22/2011. FINDINGS: CT CHEST FINDINGS Cardiovascular: The heart size is normal. Aortic atherosclerosis. Lad, left circumflex and RCA coronary artery calcifications. Mediastinum/Nodes: Normal appearance of the thyroid gland. The trachea appears patent and is midline. No enlarged mediastinal or hilar lymph nodes. No axillary or supraclavicular adenopathy. Lungs/Pleura: Exam detail is diminished due to motion artifact. No pleural effusions. Patchy area of ground-glass attenuation is identified within the lateral left lower lobe, image 92/3. Mild bilateral peripheral areas of ground-glass attenuation overlying the right upper lobe posterior lower lobes and subpleural medial left upper lobe noted. No airspace consolidation, atelectasis or pneumothorax. Musculoskeletal: No chest wall mass or suspicious bone lesions identified. CT ABDOMEN PELVIS FINDINGS Hepatobiliary: No focal liver abnormality is seen. No gallstones, gallbladder wall thickening, or biliary dilatation. Pancreas: Unremarkable. No pancreatic ductal dilatation or surrounding inflammatory changes. Spleen: Normal in size without focal abnormality. Adrenals/Urinary Tract: Normal adrenal glands. No kidney mass or hydronephrosis. Urinary bladder normal.  Stomach/Bowel: Small hiatal hernia. The stomach is nondistended. There are no dilated loops of small or large bowel. No bowel wall thickening, inflammation or distension. The appendix is not confidently identified. Vascular/Lymphatic: Aortic atherosclerosis. No aneurysm. No abdominopelvic adenopathy. Reproductive: Prostate is unremarkable. Other: Fat containing left inguinal hernia noted. Right-sided ventriculoperitoneal shunt tube is identified which terminates in the upper abdomen. Musculoskeletal: Sclerotic appearing L3 vertebral body is again noted with changes compatible with previous kyphoplasty. IMPRESSION: 1. Bilateral peripheral predominant areas of ground-glass attenuation involving the upper and lower lung zones compatible with atypical pneumonia. No lobar consolidation, atelectasis or pneumothorax. 2. No acute findings within the abdomen or pelvis 3. Small hiatal hernia 4. Coronary artery calcifications Aortic  Atherosclerosis (ICD10-I70.0). Electronically Signed   By: Kerby Moors M.D.   On: 10/11/2019 14:26   US Venous Img Lower Bilateral (DVT)  Result Date: 10/08/2019 CLINICAL DATA:  Concern for pulmonary embolism. Bilateral lower extremity edema. EXAM: BILATERAL LOWER EXTREMITY VENOUS DOPPLER ULTRASOUND TECHNIQUE: Gray-scale sonography with graded compression, as well as color Doppler and duplex ultrasound were performed to evaluate the lower extremity deep venous systems from the level of the common femoral vein and including the common femoral, femoral, profunda femoral, popliteal and calf veins including the posterior tibial, peroneal and gastrocnemius veins when visible. The superficial great saphenous vein was also interrogated. Spectral Doppler was utilized to evaluate flow at rest and with distal augmentation maneuvers in the common femoral, femoral and popliteal veins. COMPARISON:  None. FINDINGS: RIGHT LOWER EXTREMITY Common Femoral Vein: No evidence of thrombus. Normal compressibility,  respiratory phasicity and response to augmentation. Saphenofemoral Junction: No evidence of thrombus. Normal compressibility and flow on color Doppler imaging. Profunda Femoral Vein: No evidence of thrombus. Normal compressibility and flow on color Doppler imaging. Femoral Vein: No evidence of thrombus. Normal compressibility, respiratory phasicity and response to augmentation. Popliteal Vein: No evidence of thrombus. Normal compressibility, respiratory phasicity and response to augmentation. Calf Veins: No evidence of thrombus. Normal compressibility and flow on color Doppler imaging. Superficial Great Saphenous Vein: No evidence of thrombus. Normal compressibility. Venous Reflux:  None. Other Findings: No evidence of superficial thrombophlebitis or abnormal fluid collection. LEFT LOWER EXTREMITY Common Femoral Vein: No evidence of thrombus. Normal compressibility, respiratory phasicity and response to augmentation. Saphenofemoral Junction: No evidence of thrombus. Normal compressibility and flow on color Doppler imaging. Profunda Femoral Vein: No evidence of thrombus. Normal compressibility and flow on color Doppler imaging. Femoral Vein: No evidence of thrombus. Normal compressibility, respiratory phasicity and response to augmentation. Popliteal Vein: No evidence of thrombus. Normal compressibility, respiratory phasicity and response to augmentation. Calf Veins: No evidence of thrombus. Normal compressibility and flow on color Doppler imaging. Superficial Great Saphenous Vein: No evidence of thrombus. Normal compressibility. Venous Reflux:  None. Other Findings: No evidence of superficial thrombophlebitis or abnormal fluid collection. IMPRESSION: No evidence of deep venous thrombosis in either lower extremity. Electronically Signed   By: Aletta Edouard M.D.   On: 10/08/2019 10:27   DG CHEST PORT 1 VIEW  Result Date: 10/08/2019 CLINICAL DATA:  Dyspnea, COVID positive EXAM: PORTABLE CHEST 1 VIEW COMPARISON:   Chest radiograph from one day prior. FINDINGS: Intact appearing visualized right sided VP shunt. Partially visualized surgical fixation hardware in the lower cervical spine. Stable cardiomediastinal silhouette with top-normal heart size. No pneumothorax. No pleural effusion. Lungs appear clear, with no acute consolidative airspace disease and no pulmonary edema. IMPRESSION: No active disease. Electronically Signed   By: Ilona Sorrel M.D.   On: 10/08/2019 10:48   DG Chest Port 1 View  Result Date: 10/07/2019 CLINICAL DATA:  EMS reports wife called because pt unable to get up off of couch. Reports when they arrived pt was soaked in urine. Reports recently exposed to someone with covid. Reports cough for " a while." EMS reports fever 101.8. EXAM: PORTABLE CHEST - 1 VIEW COMPARISON:  07/29/2019 FINDINGS: Lungs are clear. Heart size upper limits normal for technique. Tortuous thoracic aorta. No effusion. No pneumothorax. Stable cervical fixation hardware. VP shunt tubing projects over the right hemithorax. IMPRESSION: No acute disease. Electronically Signed   By: Lucrezia Europe M.D.   On: 10/07/2019 10:34   DG FLUORO GUIDED NEEDLE PLC ASPIRATION/INJECTION LOC  Result Date: 10/11/2019 CLINICAL DATA:  Fever, encephalopathy EXAM: DIAGNOSTIC LUMBAR PUNCTURE UNDER FLUOROSCOPIC GUIDANCE FLUOROSCOPY TIME:  Fluoroscopy Time:  0 minutes 6 seconds Radiation Exposure Index (if provided by the fluoroscopic device): 15.2 mGy Number of Acquired Spot Images: 1 PROCEDURE: Procedure, benefits, and risks were discussed with the patient, including alternatives. Patient's questions were answered. Written informed consent was obtained. Timeout protocol followed. Patient placed prone. L3-L4 disc space was localized under fluoroscopy. Skin prepped and draped in usual sterile fashion. Skin and soft tissues anesthetized with 3 mL of 1% lidocaine. 22 gauge needle was advanced into spinal canal where clear colorless CSF was encountered. 9 mL  of CSF was obtained in 4 tubes for requested analysis. Procedure tolerated very well by patient without immediate complication. Opening pressure was not recorded due to patient limitations in cooperating for study. IMPRESSION: Successful fluoroscopically guided lumbar puncture as above. Electronically Signed   By: Lavonia Dana M.D.   On: 10/11/2019 14:27   US Abdomen Limited RUQ  Result Date: 10/10/2019 CLINICAL DATA:  Elevated liver enzymes.  Abdominal pain. EXAM: ULTRASOUND ABDOMEN LIMITED RIGHT UPPER QUADRANT COMPARISON:  CT, 04/26/2017. FINDINGS: Gallbladder: No gallstones or wall thickening visualized. No sonographic Murphy sign noted by sonographer. Common bile duct: Diameter: 3 mm Liver: Mild increased parenchymal echogenicity. Normal in size. No mass or focal lesion. Portal vein is patent on color Doppler imaging with normal direction of blood flow towards the liver. Other: None. IMPRESSION: 1. No acute findings.  Normal gallbladder.  No bile duct dilation. 2. Mild increased liver parenchymal echogenicity suggests hepatic steatosis. Electronically Signed   By: Lajean Manes M.D.   On: 10/10/2019 16:47    Orson Eva, DO  [facility]  If 7PM-7AM, please contact night-coverage [email or web address] Password [facility] 10/12/2019, 4:52 PM   LOS: 5 days

## 2019-10-12 NOTE — Care Management (Signed)
Updated Gerald Stabs of [facility], patient will be ready to discharge 10/13/19.

## 2019-10-13 DIAGNOSIS — N179 Acute kidney failure, unspecified: Secondary | ICD-10-CM | POA: Diagnosis not present

## 2019-10-13 DIAGNOSIS — R319 Hematuria, unspecified: Secondary | ICD-10-CM | POA: Diagnosis not present

## 2019-10-13 DIAGNOSIS — C8590 Non-Hodgkin lymphoma, unspecified, unspecified site: Secondary | ICD-10-CM | POA: Diagnosis not present

## 2019-10-13 DIAGNOSIS — N390 Urinary tract infection, site not specified: Secondary | ICD-10-CM | POA: Diagnosis not present

## 2019-10-13 DIAGNOSIS — R404 Transient alteration of awareness: Secondary | ICD-10-CM | POA: Diagnosis not present

## 2019-10-13 DIAGNOSIS — Z743 Need for continuous supervision: Secondary | ICD-10-CM | POA: Diagnosis not present

## 2019-10-13 DIAGNOSIS — R278 Other lack of coordination: Secondary | ICD-10-CM | POA: Diagnosis not present

## 2019-10-13 DIAGNOSIS — R6889 Other general symptoms and signs: Secondary | ICD-10-CM | POA: Diagnosis not present

## 2019-10-13 DIAGNOSIS — R0789 Other chest pain: Secondary | ICD-10-CM | POA: Diagnosis not present

## 2019-10-13 DIAGNOSIS — J439 Emphysema, unspecified: Secondary | ICD-10-CM | POA: Diagnosis not present

## 2019-10-13 DIAGNOSIS — Z982 Presence of cerebrospinal fluid drainage device: Secondary | ICD-10-CM | POA: Diagnosis not present

## 2019-10-13 DIAGNOSIS — I10 Essential (primary) hypertension: Secondary | ICD-10-CM | POA: Diagnosis not present

## 2019-10-13 DIAGNOSIS — R1312 Dysphagia, oropharyngeal phase: Secondary | ICD-10-CM | POA: Diagnosis not present

## 2019-10-13 DIAGNOSIS — I129 Hypertensive chronic kidney disease with stage 1 through stage 4 chronic kidney disease, or unspecified chronic kidney disease: Secondary | ICD-10-CM | POA: Diagnosis not present

## 2019-10-13 DIAGNOSIS — R062 Wheezing: Secondary | ICD-10-CM | POA: Diagnosis not present

## 2019-10-13 DIAGNOSIS — R531 Weakness: Secondary | ICD-10-CM | POA: Diagnosis not present

## 2019-10-13 DIAGNOSIS — U071 COVID-19: Secondary | ICD-10-CM | POA: Diagnosis not present

## 2019-10-13 DIAGNOSIS — N183 Chronic kidney disease, stage 3 unspecified: Secondary | ICD-10-CM | POA: Diagnosis not present

## 2019-10-13 DIAGNOSIS — M6281 Muscle weakness (generalized): Secondary | ICD-10-CM | POA: Diagnosis not present

## 2019-10-13 DIAGNOSIS — R079 Chest pain, unspecified: Secondary | ICD-10-CM | POA: Diagnosis not present

## 2019-10-13 DIAGNOSIS — G9341 Metabolic encephalopathy: Secondary | ICD-10-CM | POA: Diagnosis not present

## 2019-10-13 DIAGNOSIS — I2699 Other pulmonary embolism without acute cor pulmonale: Secondary | ICD-10-CM | POA: Diagnosis not present

## 2019-10-13 DIAGNOSIS — G919 Hydrocephalus, unspecified: Secondary | ICD-10-CM | POA: Diagnosis not present

## 2019-10-13 DIAGNOSIS — R29898 Other symptoms and signs involving the musculoskeletal system: Secondary | ICD-10-CM | POA: Diagnosis not present

## 2019-10-13 DIAGNOSIS — Z8616 Personal history of COVID-19: Secondary | ICD-10-CM | POA: Diagnosis not present

## 2019-10-13 DIAGNOSIS — E876 Hypokalemia: Secondary | ICD-10-CM | POA: Diagnosis not present

## 2019-10-13 LAB — BASIC METABOLIC PANEL
Anion gap: 8 (ref 5–15)
BUN: 31 mg/dL — ABNORMAL HIGH (ref 8–23)
CO2: 17 mmol/L — ABNORMAL LOW (ref 22–32)
Calcium: 8.1 mg/dL — ABNORMAL LOW (ref 8.9–10.3)
Chloride: 113 mmol/L — ABNORMAL HIGH (ref 98–111)
Creatinine, Ser: 1.1 mg/dL (ref 0.61–1.24)
GFR calc Af Amer: >60 mL/min (ref >60)
GFR calc non Af Amer: >60 mL/min (ref >60)
Glucose, Bld: 111 mg/dL — ABNORMAL HIGH (ref 70–99)
Potassium: 3.5 mmol/L (ref 3.5–5.1)
Sodium: 138 mmol/L (ref 135–145)

## 2019-10-13 LAB — MAGNESIUM: Magnesium: 2 mg/dL (ref 1.7–2.4)

## 2019-10-13 MED ORDER — OXYCODONE HCL 5 MG PO TABS: 5.0000 mg | ORAL_TABLET | Freq: Four times a day (QID) | ORAL | 0 refills | Status: DC | PRN

## 2019-10-13 MED ORDER — AMITRIPTYLINE HCL 50 MG PO TABS: 50.0000 mg | ORAL_TABLET | Freq: Every day | ORAL | Status: DC

## 2019-10-13 MED ORDER — DEXAMETHASONE 6 MG PO TABS: 6.0000 mg | ORAL_TABLET | Freq: Every day | ORAL | 0 refills | Status: DC

## 2019-10-13 NOTE — Progress Notes (Signed)
Report called to Abigail Butts at [facility] in [place], [place]. Deirdre Pippins, RN

## 2019-10-13 NOTE — TOC Transition Note (Signed)
Transition of Care [facility]) - CM/SW Discharge Note   Patient Details  Name: MINAS BONSER MRN: [id number] Date of Birth: 02/12/1946  [facility]) CM/SW Contact:  Sherald Barge, RN Phone Number: 10/13/2019, 11:54 AM   Clinical Narrative:   DC to [facility] today. Per rep facility ready. FL2 and DC summary faxed. RN to arrange EMS transport when ready. MD spoke with family today via phone and they are aware pt [facility] today.   Final next level of care: Skilled Nursing Facility Barriers to Discharge: No Barriers Identified   Patient Goals and CMS Choice Patient states their goals for this hospitalization and ongoing recovery are:: Spouse wants him to get stronger.   Choice offered to / list presented to : Spouse  Discharge Placement              Patient chooses bed at: [facility] Patient to be transferred to facility by: [facility] EMS Name of family member notified: MD spoke with [facility] Patient and family notified of of transfer: 10/13/19  Discharge Plan and Services                                     Social Determinants of Health (SDOH) Interventions     Readmission Risk Interventions No flowsheet data found.

## 2019-10-13 NOTE — NC FL2 (Addendum)
[place] LEVEL OF CARE SCREENING TOOL     IDENTIFICATION  Patient Name: Albert Hahn Birthdate: Apr 19, 1946 Sex: male Admission Date (Current Location): 10/07/2019  [facility] and [place] Number:  [employer] and Address:  [facility] [address], [place]      Provider Number: [id number]  Attending Physician Name and Address:  Orson Eva, MD  Relative Name and Phone Number:  Albert Hahn [phone] (spouse)    Current Level of Care: Hospital Recommended Level of Care: [place] Prior Approval Number:    Date Approved/Denied:   PASRR Number: [id number]  Discharge Plan: SNF    Current Diagnoses: Patient Active Problem List   Diagnosis Date Noted  . Fever 10/11/2019  . Acute encephalopathy   . Transaminasemia   . Acute metabolic encephalopathy 27/51/7001  . Acute renal failure superimposed on stage 3a chronic kidney disease ([place]) 10/09/2019  . Acute respiratory disease due to COVID-19 virus 10/07/2019  . Acute hypokalemia 10/07/2019  . SIRS (systemic inflammatory response syndrome) ([place]) 10/07/2019  . COVID-19 virus infection 10/07/2019  . 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 Non Hodgkin's lymphoma ([place]) 12/29/2011    Orientation RESPIRATION BLADDER Height & Weight     Self, Place  Normal(room air) Incontinent Weight: 93.2 kg Height:  5\' 7"  (170.2 cm)  BEHAVIORAL SYMPTOMS/MOOD NEUROLOGICAL BOWEL NUTRITION STATUS  (n/a) (N/A) Incontinent Diet(regular)  AMBULATORY STATUS COMMUNICATION OF NEEDS Skin   Limited Assist Verbally PU Stage and Appropriate Care PU Stage 1 Dressing: Daily(2 ulcers - one on sacrum measuring 2.3cmX1.2cm; one on buttock measuring 1.2cmX1.2cm)                     Personal Care Assistance Level of Assistance  Bathing, Feeding, Dressing Bathing Assistance: Limited assistance Feeding assistance: Limited assistance Dressing Assistance: Limited assistance     Functional  Limitations Info  Sight, Speech, Hearing Sight Info: Impaired Hearing Info: Impaired Speech Info: Adequate    SPECIAL CARE FACTORS FREQUENCY  PT (By licensed PT)     PT Frequency: 5x/week              Contractures Contractures Info: Not present    Additional Factors Info  Code Status, Allergies, Psychotropic, Isolation Precautions Code Status Info: full Allergies Info: bee Venom, Shellfish, codeine Psychotropic Info: Xanax   Isolation Precautions Info: COVID + 10/07/19     Current Medications (10/13/2019):  This is the current hospital active medication list Current Facility-Administered Medications  Medication Dose Route Frequency Provider Last Rate Last Admin  . 0.9 %  sodium chloride infusion  250 mL Intravenous PRN Emokpae, Courage, MD      . acetaminophen (TYLENOL) tablet 500-1,000 mg  500-1,000 mg Oral Q6H PRN Denton Brick, Courage, MD   500 mg at 10/11/19 0514  . amitriptyline (ELAVIL) tablet 50 mg  50 mg Oral QHS Emokpae, Courage, MD   50 mg at 10/12/19 2132  . amLODipine (NORVASC) tablet 10 mg  10 mg Oral Daily Emokpae, Courage, MD   10 mg at 10/13/19 0913  . ascorbic acid (VITAMIN C) tablet 500 mg  500 mg Oral Daily Emokpae, Courage, MD   500 mg at 10/13/19 0913  . chlorpheniramine-HYDROcodone (TUSSIONEX) 10-8 MG/5ML suspension 5 mL  5 mL Oral Q12H PRN Emokpae, Courage, MD      . dexamethasone (DECADRON) tablet 6 mg  6 mg Oral Daily Barton Dubois, MD   6 mg at 10/13/19 0913  . folic acid (FOLVITE)  tablet 1 mg  1 mg Oral Daily Emokpae, Courage, MD   1 mg at 10/13/19 0913  . guaiFENesin-dextromethorphan (ROBITUSSIN DM) 100-10 MG/5ML syrup 10 mL  10 mL Oral Q4H PRN Emokpae, Courage, MD      . heparin injection 5,000 Units  5,000 Units Subcutaneous Q8H Denton Brick, Courage, MD   5,000 Units at 10/13/19 0532  . multivitamin with minerals tablet 1 tablet  1 tablet Oral Daily Roxan Hockey, MD   1 tablet at 10/13/19 0913  . ondansetron (ZOFRAN) tablet 4 mg  4 mg Oral Q6H PRN  Emokpae, Courage, MD       Or  . ondansetron (ZOFRAN) injection 4 mg  4 mg Intravenous Q6H PRN Emokpae, Courage, MD      . oxyCODONE (Oxy IR/ROXICODONE) immediate release tablet 5 mg  5 mg Oral Q6H PRN Emokpae, Courage, MD      . phosphorus (K PHOS NEUTRAL) tablet 500 mg  500 mg Oral BID Tat, Shanon Brow, MD   500 mg at 10/13/19 0913  . polyethylene glycol (MIRALAX / GLYCOLAX) packet 17 g  17 g Oral Daily Emokpae, Courage, MD   17 g at 10/13/19 0914  . polyvinyl alcohol (LIQUIFILM TEARS) 1.4 % ophthalmic solution 1-2 drop  1-2 drop Both Eyes Daily Roxan Hockey, MD   2 drop at 10/13/19 0914  . senna-docusate (Senokot-S) tablet 2 tablet  2 tablet Oral QHS Roxan Hockey, MD   2 tablet at 10/12/19 2132  . sodium chloride flush (NS) 0.9 % injection 3 mL  3 mL Intravenous Q12H Emokpae, Courage, MD   3 mL at 10/13/19 0914  . sodium chloride flush (NS) 0.9 % injection 3 mL  3 mL Intravenous PRN Emokpae, Courage, MD      . terazosin (HYTRIN) capsule 4 mg  4 mg Oral QHS Emokpae, Courage, MD   4 mg at 10/12/19 2132  . thiamine tablet 100 mg  100 mg Oral Daily Emokpae, Courage, MD   100 mg at 10/13/19 0913  . topiramate (TOPAMAX) tablet 100 mg  100 mg Oral Daily Emokpae, Courage, MD   100 mg at 10/13/19 0913  . zinc sulfate capsule 220 mg  220 mg Oral Daily Emokpae, Courage, MD   220 mg at 10/13/19 1478     Discharge Medications: Please see discharge summary for a list of discharge medications.  Relevant Imaging Results:  Relevant Lab Results:   Additional Information SSN [place] [phone], Margretta Sidle, RN

## 2019-10-13 NOTE — Plan of Care (Signed)

## 2019-10-13 NOTE — Discharge Summary (Signed)
Physician Discharge Summary  Albert Hahn ASN:[id number] DOB: 06-27-1946 DOA: 10/07/2019  PCP: Asencion Noble, MD  Admit date: 10/07/2019 Discharge date: 10/13/2019  Admitted From: Home Disposition:  Home   Recommendations for Outpatient Follow-up:  1. Follow up with PCP in 1-2 weeks 2. Please obtain BMP/CBC in one week    Discharge Condition: Stable CODE STATUS: FULL Diet recommendation: Regular   Brief/Interim Summary: 74 y.o.malewith past medical history relevant for history ofNHL,VP shunt for NPH, PTSD, HTN, with recent COVID-19 exposure who presents to the ED withseveral days of generalizedweakness, fatigue, fevers,cough,tachypnea without hypoxia. Pt received first dose of COVID-19 vaccination about 2 weeks ago. He is due for his second dose on 10/12/19. EMS was activated due to pt having generalized weakness to the point of being unable to get off couch. EMS reports patient was soaked in urine on their arrival has been coughing.He denied f/c cp, sob, n/v/d. In [place] x-ray without acute findings,UA not consistent with UTI,WBCs only 5.9,lactic acid is 0.8 -Procalcitonin 0.13, -Influenza negative -CRP is 9.2,D-dimer 6.87(Nohypoxia),LDH elevated at 212,fibrinogen elevated at 576, -Ferritin 238,triglycerides 92,  -Creatinine is 2.1,potassium is also 2.1 -he was started on IVF with improvement of his renal function back to baseline -his hospitalization was complicated by fever.  Repeat cultures and LP were unremarkable.  He remained hemodynamically stable off antibiotics  Discharge Diagnoses:  SIRS -present on admission -sepsis ruled out -due to COVID-19 infection -d/c vanco/cefepime -10/11/19--developed fever 101.0  -repeat blood culture--neg to date -2/26-repeat UA--no pyuria -request LP--WBC =2, protein 26, glu 66, culture neg -check PCT--0.28 -check lactate--1.4 -continue IVF>>>saline lock -2/26 CT chest--Bilateral peripheral predominant areas  of ground-glass attenuation involving the upper and lower lung zones compatible with atypical pneumonia -2/26 CT abd/pelvis--no acute findings  COVID-19 Infection -no hypoxia -personally reviewed CXR--no infiltrates, no edema -CRP 9.2>>>8.1>>>8.1>>8.4 -d-Dimer-6.87>>>5.01>>>3.29>>5.29 -ferritin 238>>265>>287>>418 -PCT 0.13>>0.28 -LDH 212 -fibrinogen 576 -continued zinc and vitamin c -dexamethasone x 3 more days  Acute metabolic Encephalopathy -pt has underlying cognitive impairment -worsen due to infectious process and renal failure and hospital delirium -TSH--1.637 -B12-379 -folate--15.1 -ammonia--41 -TSH--1.637 -remains pleasantly confused -MR brain cannot be obtained due to shunt incompatibility -2/25 repeat CT brain--neg for acute findings -overall slowly improving  Acute on chronic renal failure--CKD 3a -baseline creatinine 1.3-1.4 -serum creatinine peaked 2.62>>>1.30 -due to volume depletion -continue IVF-->improved -serum creatinine 1.10 on day of discharge  Cognitive Impairment -07/25/19 note from [facility] states--longstanding left-sided weakness, with associated gait difficulty with 9 falls over the last few months -They also noted hx of irrational thoughts and cognitive changes -need formal neuropsychiatric eval in future once stable  HTN -continue amlodipine, terazosin  Hypokalemia -replete -mag 2.0 on day of d/c  Anxiety/Depression/PTSD -continue alprazolam, topiramate, Elavil, at hs  Stage 1 gluteal pressure ulcer  -present on admission -continue local care    Discharge Instructions   Allergies as of 10/13/2019      Reactions   Bee Venom Anaphylaxis   Shellfish Allergy Anaphylaxis   Codeine Nausea And Vomiting      Medication List    STOP taking these medications   ALPRAZolam 1 MG tablet Commonly known as: Xanax   hydrochlorothiazide 25 MG tablet Commonly known as: HYDRODIURIL   temazepam 15 MG capsule Commonly known as:  RESTORIL     TAKE these medications   acetaminophen 500 MG tablet Commonly known as: TYLENOL Take 500-1,000 mg by mouth every 6 (six) hours as needed for mild pain or moderate pain.   amitriptyline 50 MG tablet  Commonly known as: ELAVIL Take 1 tablet (50 mg total) by mouth at bedtime. What changed: additional instructions   amLODipine 10 MG tablet Commonly known as: NORVASC Take 10 mg by mouth daily.   dexamethasone 6 MG tablet Commonly known as: DECADRON Take 1 tablet (6 mg total) by mouth daily. X 3 days Start taking on: October 14, 2019   docusate sodium 100 MG capsule Commonly known as: COLACE Take 500 mg by mouth daily.   Lubricant Eye Drops 0.4-0.3 % Soln Generic drug: Polyethyl Glycol-Propyl Glycol Place 1-2 drops into both eyes daily.   oxyCODONE 5 MG immediate release tablet Commonly known as: Oxy IR/ROXICODONE Take 1 tablet (5 mg total) by mouth every 6 (six) hours as needed (for pain.).   terazosin 2 MG capsule Commonly known as: HYTRIN Take 4 mg by mouth at bedtime.   Topamax 100 MG tablet Generic drug: topiramate Take 100 mg by mouth daily. At bedtime      Contact information for after-discharge care    [facility] .   Service: Skilled Nursing Contact information: [address] [address] [phone]             Allergies  Allergen Reactions  . Bee Venom Anaphylaxis  . Shellfish Allergy Anaphylaxis  . Codeine Nausea And Vomiting    Consultations:  none   Procedures/Studies: CT ABDOMEN PELVIS WO CONTRAST  Result Date: 10/11/2019 CLINICAL DATA:  Cough, dyspnea. COVID-19 positive. Abdominal distension. Remote history of lymphoma. EXAM: CT CHEST, ABDOMEN AND PELVIS WITHOUT CONTRAST TECHNIQUE: Multidetector CT imaging of the chest, abdomen and pelvis was performed following the standard protocol without IV contrast. COMPARISON:  CT AP 04/26/2017 in CT chest 12/22/2011.  FINDINGS: CT CHEST FINDINGS Cardiovascular: The heart size is normal. Aortic atherosclerosis. Lad, left circumflex and RCA coronary artery calcifications. Mediastinum/Nodes: Normal appearance of the thyroid gland. The trachea appears patent and is midline. No enlarged mediastinal or hilar lymph nodes. No axillary or supraclavicular adenopathy. Lungs/Pleura: Exam detail is diminished due to motion artifact. No pleural effusions. Patchy area of ground-glass attenuation is identified within the lateral left lower lobe, image 92/3. Mild bilateral peripheral areas of ground-glass attenuation overlying the right upper lobe posterior lower lobes and subpleural medial left upper lobe noted. No airspace consolidation, atelectasis or pneumothorax. Musculoskeletal: No chest wall mass or suspicious bone lesions identified. CT ABDOMEN PELVIS FINDINGS Hepatobiliary: No focal liver abnormality is seen. No gallstones, gallbladder wall thickening, or biliary dilatation. Pancreas: Unremarkable. No pancreatic ductal dilatation or surrounding inflammatory changes. Spleen: Normal in size without focal abnormality. Adrenals/Urinary Tract: Normal adrenal glands. No kidney mass or hydronephrosis. Urinary bladder normal. Stomach/Bowel: Small hiatal hernia. The stomach is nondistended. There are no dilated loops of small or large bowel. No bowel wall thickening, inflammation or distension. The appendix is not confidently identified. Vascular/Lymphatic: Aortic atherosclerosis. No aneurysm. No abdominopelvic adenopathy. Reproductive: Prostate is unremarkable. Other: Fat containing left inguinal hernia noted. Right-sided ventriculoperitoneal shunt tube is identified which terminates in the upper abdomen. Musculoskeletal: Sclerotic appearing L3 vertebral body is again noted with changes compatible with previous kyphoplasty. IMPRESSION: 1. Bilateral peripheral predominant areas of ground-glass attenuation involving the upper and lower lung zones  compatible with atypical pneumonia. No lobar consolidation, atelectasis or pneumothorax. 2. No acute findings within the abdomen or pelvis 3. Small hiatal hernia 4. Coronary artery calcifications Aortic Atherosclerosis (ICD10-I70.0). Electronically Signed   By: Kerby Moors M.D.   On: 10/11/2019 14:26  CT HEAD WO CONTRAST  Result Date: 10/10/2019 CLINICAL DATA:  Encephalopathy EXAM: CT HEAD WITHOUT CONTRAST TECHNIQUE: Contiguous axial images were obtained from the base of the skull through the vertex without intravenous contrast. COMPARISON:  10/07/2019 FINDINGS: Brain: Right frontal approach shunt catheter is stable in position. Discontinued right parietal approach shunt catheter again noted. Ventricle caliber is stable. There is no acute intracranial hemorrhage or mass effect. There is no new loss of gray-white differentiation. Nonspecific primarily periventricular white matter hypoattenuation is unchanged. Vascular: No hyperdense vessel. Skull: Calvarium is unremarkable apart from burr holes. Sinuses/Orbits: No new finding. Chronic sphenoid sinusitis is again noted. Other: None. IMPRESSION: No acute abnormality. Stable appearance compared to 10/07/2019 examination. Electronically Signed   By: Macy Mis M.D.   On: 10/10/2019 17:12   CT Head Wo Contrast  Result Date: 10/07/2019 CLINICAL DATA:  Encephalopathy, VP shunt EXAM: CT HEAD WITHOUT CONTRAST TECHNIQUE: Contiguous axial images were obtained from the base of the skull through the vertex without intravenous contrast. COMPARISON:  01/06/2017 FINDINGS: Brain: Right frontal approach shunt catheter is new from the prior study and terminates near midline within the interhemispheric fissure. Right parietal approach catheter has been discontinued. Ventricle caliber is similar. There is no acute intracranial hemorrhage or mass effect. No new loss of gray differentiation. Nonspecific primarily periventricular white matter hypoattenuation. Vascular: No  hyperdense vessel. Skull: Unremarkable apart from burr holes. Sinuses/Orbits: Patchy mucosal thickening with chronic sphenoid sinusitis. Other: Mastoid air cells are clear. IMPRESSION: Interval placement of right frontal approach shunt catheter. Similar enlargement of the third and lateral ventricles. No acute intracranial hemorrhage, mass effect, or evidence of acute infarction. Electronically Signed   By: Macy Mis M.D.   On: 10/07/2019 11:16   CT CHEST WO CONTRAST  Result Date: 10/11/2019 CLINICAL DATA:  Cough, dyspnea. COVID-19 positive. Abdominal distension. Remote history of lymphoma. EXAM: CT CHEST, ABDOMEN AND PELVIS WITHOUT CONTRAST TECHNIQUE: Multidetector CT imaging of the chest, abdomen and pelvis was performed following the standard protocol without IV contrast. COMPARISON:  CT AP 04/26/2017 in CT chest 12/22/2011. FINDINGS: CT CHEST FINDINGS Cardiovascular: The heart size is normal. Aortic atherosclerosis. Lad, left circumflex and RCA coronary artery calcifications. Mediastinum/Nodes: Normal appearance of the thyroid gland. The trachea appears patent and is midline. No enlarged mediastinal or hilar lymph nodes. No axillary or supraclavicular adenopathy. Lungs/Pleura: Exam detail is diminished due to motion artifact. No pleural effusions. Patchy area of ground-glass attenuation is identified within the lateral left lower lobe, image 92/3. Mild bilateral peripheral areas of ground-glass attenuation overlying the right upper lobe posterior lower lobes and subpleural medial left upper lobe noted. No airspace consolidation, atelectasis or pneumothorax. Musculoskeletal: No chest wall mass or suspicious bone lesions identified. CT ABDOMEN PELVIS FINDINGS Hepatobiliary: No focal liver abnormality is seen. No gallstones, gallbladder wall thickening, or biliary dilatation. Pancreas: Unremarkable. No pancreatic ductal dilatation or surrounding inflammatory changes. Spleen: Normal in size without focal  abnormality. Adrenals/Urinary Tract: Normal adrenal glands. No kidney mass or hydronephrosis. Urinary bladder normal. Stomach/Bowel: Small hiatal hernia. The stomach is nondistended. There are no dilated loops of small or large bowel. No bowel wall thickening, inflammation or distension. The appendix is not confidently identified. Vascular/Lymphatic: Aortic atherosclerosis. No aneurysm. No abdominopelvic adenopathy. Reproductive: Prostate is unremarkable. Other: Fat containing left inguinal hernia noted. Right-sided ventriculoperitoneal shunt tube is identified which terminates in the upper abdomen. Musculoskeletal: Sclerotic appearing L3 vertebral body is again noted with changes compatible with previous kyphoplasty. IMPRESSION: 1. Bilateral peripheral predominant areas of  ground-glass attenuation involving the upper and lower lung zones compatible with atypical pneumonia. No lobar consolidation, atelectasis or pneumothorax. 2. No acute findings within the abdomen or pelvis 3. Small hiatal hernia 4. Coronary artery calcifications Aortic Atherosclerosis (ICD10-I70.0). Electronically Signed   By: Kerby Moors M.D.   On: 10/11/2019 14:26   US Venous Img Lower Bilateral (DVT)  Result Date: 10/08/2019 CLINICAL DATA:  Concern for pulmonary embolism. Bilateral lower extremity edema. EXAM: BILATERAL LOWER EXTREMITY VENOUS DOPPLER ULTRASOUND TECHNIQUE: Gray-scale sonography with graded compression, as well as color Doppler and duplex ultrasound were performed to evaluate the lower extremity deep venous systems from the level of the common femoral vein and including the common femoral, femoral, profunda femoral, popliteal and calf veins including the posterior tibial, peroneal and gastrocnemius veins when visible. The superficial great saphenous vein was also interrogated. Spectral Doppler was utilized to evaluate flow at rest and with distal augmentation maneuvers in the common femoral, femoral and popliteal veins.  COMPARISON:  None. FINDINGS: RIGHT LOWER EXTREMITY Common Femoral Vein: No evidence of thrombus. Normal compressibility, respiratory phasicity and response to augmentation. Saphenofemoral Junction: No evidence of thrombus. Normal compressibility and flow on color Doppler imaging. Profunda Femoral Vein: No evidence of thrombus. Normal compressibility and flow on color Doppler imaging. Femoral Vein: No evidence of thrombus. Normal compressibility, respiratory phasicity and response to augmentation. Popliteal Vein: No evidence of thrombus. Normal compressibility, respiratory phasicity and response to augmentation. Calf Veins: No evidence of thrombus. Normal compressibility and flow on color Doppler imaging. Superficial Great Saphenous Vein: No evidence of thrombus. Normal compressibility. Venous Reflux:  None. Other Findings: No evidence of superficial thrombophlebitis or abnormal fluid collection. LEFT LOWER EXTREMITY Common Femoral Vein: No evidence of thrombus. Normal compressibility, respiratory phasicity and response to augmentation. Saphenofemoral Junction: No evidence of thrombus. Normal compressibility and flow on color Doppler imaging. Profunda Femoral Vein: No evidence of thrombus. Normal compressibility and flow on color Doppler imaging. Femoral Vein: No evidence of thrombus. Normal compressibility, respiratory phasicity and response to augmentation. Popliteal Vein: No evidence of thrombus. Normal compressibility, respiratory phasicity and response to augmentation. Calf Veins: No evidence of thrombus. Normal compressibility and flow on color Doppler imaging. Superficial Great Saphenous Vein: No evidence of thrombus. Normal compressibility. Venous Reflux:  None. Other Findings: No evidence of superficial thrombophlebitis or abnormal fluid collection. IMPRESSION: No evidence of deep venous thrombosis in either lower extremity. Electronically Signed   By: Aletta Edouard M.D.   On: 10/08/2019 10:27   DG CHEST  PORT 1 VIEW  Result Date: 10/08/2019 CLINICAL DATA:  Dyspnea, COVID positive EXAM: PORTABLE CHEST 1 VIEW COMPARISON:  Chest radiograph from one day prior. FINDINGS: Intact appearing visualized right sided VP shunt. Partially visualized surgical fixation hardware in the lower cervical spine. Stable cardiomediastinal silhouette with top-normal heart size. No pneumothorax. No pleural effusion. Lungs appear clear, with no acute consolidative airspace disease and no pulmonary edema. IMPRESSION: No active disease. Electronically Signed   By: Ilona Sorrel M.D.   On: 10/08/2019 10:48   DG Chest Port 1 View  Result Date: 10/07/2019 CLINICAL DATA:  EMS reports wife called because pt unable to get up off of couch. Reports when they arrived pt was soaked in urine. Reports recently exposed to someone with covid. Reports cough for " a while." EMS reports fever 101.8. EXAM: PORTABLE CHEST - 1 VIEW COMPARISON:  07/29/2019 FINDINGS: Lungs are clear. Heart size upper limits normal for technique. Tortuous thoracic aorta. No effusion. No pneumothorax. Stable cervical fixation  hardware. VP shunt tubing projects over the right hemithorax. IMPRESSION: No acute disease. Electronically Signed   By: Lucrezia Europe M.D.   On: 10/07/2019 10:34   DG FLUORO GUIDED NEEDLE PLC ASPIRATION/INJECTION LOC  Result Date: 10/11/2019 CLINICAL DATA:  Fever, encephalopathy EXAM: DIAGNOSTIC LUMBAR PUNCTURE UNDER FLUOROSCOPIC GUIDANCE FLUOROSCOPY TIME:  Fluoroscopy Time:  0 minutes 6 seconds Radiation Exposure Index (if provided by the fluoroscopic device): 15.2 mGy Number of Acquired Spot Images: 1 PROCEDURE: Procedure, benefits, and risks were discussed with the patient, including alternatives. Patient's questions were answered. Written informed consent was obtained. Timeout protocol followed. Patient placed prone. L3-L4 disc space was localized under fluoroscopy. Skin prepped and draped in usual sterile fashion. Skin and soft tissues anesthetized  with 3 mL of 1% lidocaine. 22 gauge needle was advanced into spinal canal where clear colorless CSF was encountered. 9 mL of CSF was obtained in 4 tubes for requested analysis. Procedure tolerated very well by patient without immediate complication. Opening pressure was not recorded due to patient limitations in cooperating for study. IMPRESSION: Successful fluoroscopically guided lumbar puncture as above. Electronically Signed   By: Lavonia Dana M.D.   On: 10/11/2019 14:27   US Abdomen Limited RUQ  Result Date: 10/10/2019 CLINICAL DATA:  Elevated liver enzymes.  Abdominal pain. EXAM: ULTRASOUND ABDOMEN LIMITED RIGHT UPPER QUADRANT COMPARISON:  CT, 04/26/2017. FINDINGS: Gallbladder: No gallstones or wall thickening visualized. No sonographic Murphy sign noted by sonographer. Common bile duct: Diameter: 3 mm Liver: Mild increased parenchymal echogenicity. Normal in size. No mass or focal lesion. Portal vein is patent on color Doppler imaging with normal direction of blood flow towards the liver. Other: None. IMPRESSION: 1. No acute findings.  Normal gallbladder.  No bile duct dilation. 2. Mild increased liver parenchymal echogenicity suggests hepatic steatosis. Electronically Signed   By: Lajean Manes M.D.   On: 10/10/2019 16:47        Discharge Exam: Vitals:   10/12/19 2128 10/13/19 0535  BP: 129/73 121/60  Pulse: 68 65  Resp: 17 17  Temp: 97.7 F (36.5 C) (!) 97.3 F (36.3 C)  SpO2: 99% 96%   Vitals:   10/12/19 0556 10/12/19 1300 10/12/19 2128 10/13/19 0535  BP: (!) 142/74 121/69 129/73 121/60  Pulse: 82 66 68 65  Resp:  (!) 21 17 17   Temp: 100.1 F (37.8 C) 98.9 F (37.2 C) 97.7 F (36.5 C) (!) 97.3 F (36.3 C)  TempSrc: Oral Oral Oral Axillary  SpO2: 100% 96% 99% 96%  Weight:      Height:        General: Pt is alert, awake, not in acute distress Cardiovascular: RRR, S1/S2 +, no rubs, no gallops Respiratory: CTA bilaterally, no wheezing, no rhonchi Abdominal: Soft, NT, ND,  bowel sounds + Extremities: no edema, no cyanosis   The results of significant diagnostics from this hospitalization (including imaging, microbiology, ancillary and laboratory) are listed below for reference.    Significant Diagnostic Studies: CT ABDOMEN PELVIS WO CONTRAST  Result Date: 10/11/2019 CLINICAL DATA:  Cough, dyspnea. COVID-19 positive. Abdominal distension. Remote history of lymphoma. EXAM: CT CHEST, ABDOMEN AND PELVIS WITHOUT CONTRAST TECHNIQUE: Multidetector CT imaging of the chest, abdomen and pelvis was performed following the standard protocol without IV contrast. COMPARISON:  CT AP 04/26/2017 in CT chest 12/22/2011. FINDINGS: CT CHEST FINDINGS Cardiovascular: The heart size is normal. Aortic atherosclerosis. Lad, left circumflex and RCA coronary artery calcifications. Mediastinum/Nodes: Normal appearance of the thyroid gland. The trachea appears patent and is midline.  No enlarged mediastinal or hilar lymph nodes. No axillary or supraclavicular adenopathy. Lungs/Pleura: Exam detail is diminished due to motion artifact. No pleural effusions. Patchy area of ground-glass attenuation is identified within the lateral left lower lobe, image 92/3. Mild bilateral peripheral areas of ground-glass attenuation overlying the right upper lobe posterior lower lobes and subpleural medial left upper lobe noted. No airspace consolidation, atelectasis or pneumothorax. Musculoskeletal: No chest wall mass or suspicious bone lesions identified. CT ABDOMEN PELVIS FINDINGS Hepatobiliary: No focal liver abnormality is seen. No gallstones, gallbladder wall thickening, or biliary dilatation. Pancreas: Unremarkable. No pancreatic ductal dilatation or surrounding inflammatory changes. Spleen: Normal in size without focal abnormality. Adrenals/Urinary Tract: Normal adrenal glands. No kidney mass or hydronephrosis. Urinary bladder normal. Stomach/Bowel: Small hiatal hernia. The stomach is nondistended. There are no  dilated loops of small or large bowel. No bowel wall thickening, inflammation or distension. The appendix is not confidently identified. Vascular/Lymphatic: Aortic atherosclerosis. No aneurysm. No abdominopelvic adenopathy. Reproductive: Prostate is unremarkable. Other: Fat containing left inguinal hernia noted. Right-sided ventriculoperitoneal shunt tube is identified which terminates in the upper abdomen. Musculoskeletal: Sclerotic appearing L3 vertebral body is again noted with changes compatible with previous kyphoplasty. IMPRESSION: 1. Bilateral peripheral predominant areas of ground-glass attenuation involving the upper and lower lung zones compatible with atypical pneumonia. No lobar consolidation, atelectasis or pneumothorax. 2. No acute findings within the abdomen or pelvis 3. Small hiatal hernia 4. Coronary artery calcifications Aortic Atherosclerosis (ICD10-I70.0). Electronically Signed   By: Kerby Moors M.D.   On: 10/11/2019 14:26   CT HEAD WO CONTRAST  Result Date: 10/10/2019 CLINICAL DATA:  Encephalopathy EXAM: CT HEAD WITHOUT CONTRAST TECHNIQUE: Contiguous axial images were obtained from the base of the skull through the vertex without intravenous contrast. COMPARISON:  10/07/2019 FINDINGS: Brain: Right frontal approach shunt catheter is stable in position. Discontinued right parietal approach shunt catheter again noted. Ventricle caliber is stable. There is no acute intracranial hemorrhage or mass effect. There is no new loss of gray-white differentiation. Nonspecific primarily periventricular white matter hypoattenuation is unchanged. Vascular: No hyperdense vessel. Skull: Calvarium is unremarkable apart from burr holes. Sinuses/Orbits: No new finding. Chronic sphenoid sinusitis is again noted. Other: None. IMPRESSION: No acute abnormality. Stable appearance compared to 10/07/2019 examination. Electronically Signed   By: Macy Mis M.D.   On: 10/10/2019 17:12   CT Head Wo  Contrast  Result Date: 10/07/2019 CLINICAL DATA:  Encephalopathy, VP shunt EXAM: CT HEAD WITHOUT CONTRAST TECHNIQUE: Contiguous axial images were obtained from the base of the skull through the vertex without intravenous contrast. COMPARISON:  01/06/2017 FINDINGS: Brain: Right frontal approach shunt catheter is new from the prior study and terminates near midline within the interhemispheric fissure. Right parietal approach catheter has been discontinued. Ventricle caliber is similar. There is no acute intracranial hemorrhage or mass effect. No new loss of gray differentiation. Nonspecific primarily periventricular white matter hypoattenuation. Vascular: No hyperdense vessel. Skull: Unremarkable apart from burr holes. Sinuses/Orbits: Patchy mucosal thickening with chronic sphenoid sinusitis. Other: Mastoid air cells are clear. IMPRESSION: Interval placement of right frontal approach shunt catheter. Similar enlargement of the third and lateral ventricles. No acute intracranial hemorrhage, mass effect, or evidence of acute infarction. Electronically Signed   By: Macy Mis M.D.   On: 10/07/2019 11:16   CT CHEST WO CONTRAST  Result Date: 10/11/2019 CLINICAL DATA:  Cough, dyspnea. COVID-19 positive. Abdominal distension. Remote history of lymphoma. EXAM: CT CHEST, ABDOMEN AND PELVIS WITHOUT CONTRAST TECHNIQUE: Multidetector CT imaging of the chest,  abdomen and pelvis was performed following the standard protocol without IV contrast. COMPARISON:  CT AP 04/26/2017 in CT chest 12/22/2011. FINDINGS: CT CHEST FINDINGS Cardiovascular: The heart size is normal. Aortic atherosclerosis. Lad, left circumflex and RCA coronary artery calcifications. Mediastinum/Nodes: Normal appearance of the thyroid gland. The trachea appears patent and is midline. No enlarged mediastinal or hilar lymph nodes. No axillary or supraclavicular adenopathy. Lungs/Pleura: Exam detail is diminished due to motion artifact. No pleural effusions.  Patchy area of ground-glass attenuation is identified within the lateral left lower lobe, image 92/3. Mild bilateral peripheral areas of ground-glass attenuation overlying the right upper lobe posterior lower lobes and subpleural medial left upper lobe noted. No airspace consolidation, atelectasis or pneumothorax. Musculoskeletal: No chest wall mass or suspicious bone lesions identified. CT ABDOMEN PELVIS FINDINGS Hepatobiliary: No focal liver abnormality is seen. No gallstones, gallbladder wall thickening, or biliary dilatation. Pancreas: Unremarkable. No pancreatic ductal dilatation or surrounding inflammatory changes. Spleen: Normal in size without focal abnormality. Adrenals/Urinary Tract: Normal adrenal glands. No kidney mass or hydronephrosis. Urinary bladder normal. Stomach/Bowel: Small hiatal hernia. The stomach is nondistended. There are no dilated loops of small or large bowel. No bowel wall thickening, inflammation or distension. The appendix is not confidently identified. Vascular/Lymphatic: Aortic atherosclerosis. No aneurysm. No abdominopelvic adenopathy. Reproductive: Prostate is unremarkable. Other: Fat containing left inguinal hernia noted. Right-sided ventriculoperitoneal shunt tube is identified which terminates in the upper abdomen. Musculoskeletal: Sclerotic appearing L3 vertebral body is again noted with changes compatible with previous kyphoplasty. IMPRESSION: 1. Bilateral peripheral predominant areas of ground-glass attenuation involving the upper and lower lung zones compatible with atypical pneumonia. No lobar consolidation, atelectasis or pneumothorax. 2. No acute findings within the abdomen or pelvis 3. Small hiatal hernia 4. Coronary artery calcifications Aortic Atherosclerosis (ICD10-I70.0). Electronically Signed   By: Kerby Moors M.D.   On: 10/11/2019 14:26   US Venous Img Lower Bilateral (DVT)  Result Date: 10/08/2019 CLINICAL DATA:  Concern for pulmonary embolism. Bilateral  lower extremity edema. EXAM: BILATERAL LOWER EXTREMITY VENOUS DOPPLER ULTRASOUND TECHNIQUE: Gray-scale sonography with graded compression, as well as color Doppler and duplex ultrasound were performed to evaluate the lower extremity deep venous systems from the level of the common femoral vein and including the common femoral, femoral, profunda femoral, popliteal and calf veins including the posterior tibial, peroneal and gastrocnemius veins when visible. The superficial great saphenous vein was also interrogated. Spectral Doppler was utilized to evaluate flow at rest and with distal augmentation maneuvers in the common femoral, femoral and popliteal veins. COMPARISON:  None. FINDINGS: RIGHT LOWER EXTREMITY Common Femoral Vein: No evidence of thrombus. Normal compressibility, respiratory phasicity and response to augmentation. Saphenofemoral Junction: No evidence of thrombus. Normal compressibility and flow on color Doppler imaging. Profunda Femoral Vein: No evidence of thrombus. Normal compressibility and flow on color Doppler imaging. Femoral Vein: No evidence of thrombus. Normal compressibility, respiratory phasicity and response to augmentation. Popliteal Vein: No evidence of thrombus. Normal compressibility, respiratory phasicity and response to augmentation. Calf Veins: No evidence of thrombus. Normal compressibility and flow on color Doppler imaging. Superficial Great Saphenous Vein: No evidence of thrombus. Normal compressibility. Venous Reflux:  None. Other Findings: No evidence of superficial thrombophlebitis or abnormal fluid collection. LEFT LOWER EXTREMITY Common Femoral Vein: No evidence of thrombus. Normal compressibility, respiratory phasicity and response to augmentation. Saphenofemoral Junction: No evidence of thrombus. Normal compressibility and flow on color Doppler imaging. Profunda Femoral Vein: No evidence of thrombus. Normal compressibility and flow on color Doppler imaging. Femoral  Vein: No  evidence of thrombus. Normal compressibility, respiratory phasicity and response to augmentation. Popliteal Vein: No evidence of thrombus. Normal compressibility, respiratory phasicity and response to augmentation. Calf Veins: No evidence of thrombus. Normal compressibility and flow on color Doppler imaging. Superficial Great Saphenous Vein: No evidence of thrombus. Normal compressibility. Venous Reflux:  None. Other Findings: No evidence of superficial thrombophlebitis or abnormal fluid collection. IMPRESSION: No evidence of deep venous thrombosis in either lower extremity. Electronically Signed   By: Aletta Edouard M.D.   On: 10/08/2019 10:27   DG CHEST PORT 1 VIEW  Result Date: 10/08/2019 CLINICAL DATA:  Dyspnea, COVID positive EXAM: PORTABLE CHEST 1 VIEW COMPARISON:  Chest radiograph from one day prior. FINDINGS: Intact appearing visualized right sided VP shunt. Partially visualized surgical fixation hardware in the lower cervical spine. Stable cardiomediastinal silhouette with top-normal heart size. No pneumothorax. No pleural effusion. Lungs appear clear, with no acute consolidative airspace disease and no pulmonary edema. IMPRESSION: No active disease. Electronically Signed   By: Ilona Sorrel M.D.   On: 10/08/2019 10:48   DG Chest Port 1 View  Result Date: 10/07/2019 CLINICAL DATA:  EMS reports wife called because pt unable to get up off of couch. Reports when they arrived pt was soaked in urine. Reports recently exposed to someone with covid. Reports cough for " a while." EMS reports fever 101.8. EXAM: PORTABLE CHEST - 1 VIEW COMPARISON:  07/29/2019 FINDINGS: Lungs are clear. Heart size upper limits normal for technique. Tortuous thoracic aorta. No effusion. No pneumothorax. Stable cervical fixation hardware. VP shunt tubing projects over the right hemithorax. IMPRESSION: No acute disease. Electronically Signed   By: Lucrezia Europe M.D.   On: 10/07/2019 10:34   DG FLUORO GUIDED NEEDLE PLC  ASPIRATION/INJECTION LOC  Result Date: 10/11/2019 CLINICAL DATA:  Fever, encephalopathy EXAM: DIAGNOSTIC LUMBAR PUNCTURE UNDER FLUOROSCOPIC GUIDANCE FLUOROSCOPY TIME:  Fluoroscopy Time:  0 minutes 6 seconds Radiation Exposure Index (if provided by the fluoroscopic device): 15.2 mGy Number of Acquired Spot Images: 1 PROCEDURE: Procedure, benefits, and risks were discussed with the patient, including alternatives. Patient's questions were answered. Written informed consent was obtained. Timeout protocol followed. Patient placed prone. L3-L4 disc space was localized under fluoroscopy. Skin prepped and draped in usual sterile fashion. Skin and soft tissues anesthetized with 3 mL of 1% lidocaine. 22 gauge needle was advanced into spinal canal where clear colorless CSF was encountered. 9 mL of CSF was obtained in 4 tubes for requested analysis. Procedure tolerated very well by patient without immediate complication. Opening pressure was not recorded due to patient limitations in cooperating for study. IMPRESSION: Successful fluoroscopically guided lumbar puncture as above. Electronically Signed   By: Lavonia Dana M.D.   On: 10/11/2019 14:27   US Abdomen Limited RUQ  Result Date: 10/10/2019 CLINICAL DATA:  Elevated liver enzymes.  Abdominal pain. EXAM: ULTRASOUND ABDOMEN LIMITED RIGHT UPPER QUADRANT COMPARISON:  CT, 04/26/2017. FINDINGS: Gallbladder: No gallstones or wall thickening visualized. No sonographic Murphy sign noted by sonographer. Common bile duct: Diameter: 3 mm Liver: Mild increased parenchymal echogenicity. Normal in size. No mass or focal lesion. Portal vein is patent on color Doppler imaging with normal direction of blood flow towards the liver. Other: None. IMPRESSION: 1. No acute findings.  Normal gallbladder.  No bile duct dilation. 2. Mild increased liver parenchymal echogenicity suggests hepatic steatosis. Electronically Signed   By: Lajean Manes M.D.   On: 10/10/2019 16:47      Microbiology: Recent Results (from the past 240  hour(s))  Blood Culture (routine x 2)     Status: None   Collection Time: 10/07/19 10:13 AM   Specimen: BLOOD LEFT ARM  Result Value Ref Range Status   Specimen Description BLOOD LEFT ARM  Final   Special Requests   Final    BOTTLES DRAWN AEROBIC ONLY Blood Culture adequate volume   Culture   Final    NO GROWTH 5 DAYS Performed at [facility], [address], [place], [place] [address]    Report Status 10/12/2019 FINAL  Final  Urine Culture     Status: None   Collection Time: 10/07/19 10:13 AM   Specimen: Urine, Clean Catch  Result Value Ref Range Status   Specimen Description   Final    URINE, CLEAN CATCH Performed at [facility], [address], [place], [place] [address]    Special Requests   Final    NONE Performed at [facility], [address], [place], [place] [address]    Culture   Final    NO GROWTH Performed at [facility], [place] [address] [place], [place] [address]    Report Status 10/08/2019 FINAL  Final  Blood Culture (routine x 2)     Status: None   Collection Time: 10/07/19 10:18 AM   Specimen: BLOOD LEFT HAND  Result Value Ref Range Status   Specimen Description BLOOD LEFT HAND  Final   Special Requests   Final    BOTTLES DRAWN AEROBIC ONLY Blood Culture results may not be optimal due to an inadequate volume of blood received in culture bottles   Culture   Final    NO GROWTH 5 DAYS Performed at [facility], [address] [place], [place] [address]    Report Status 10/12/2019 FINAL  Final  Culture, blood (Routine X 2) w Reflex to ID Panel     Status: None (Preliminary result)   Collection Time: 10/11/19  9:32 AM   Specimen: Left Antecubital; Blood  Result Value Ref Range Status   Specimen Description   Final    LEFT ANTECUBITAL BOTTLES DRAWN AEROBIC AND ANAEROBIC   Special Requests Blood Culture adequate volume  Final   Culture   Final    NO GROWTH < 24 HOURS Performed at [facility], [address] [place], [place] [address]    Report Status PENDING  Incomplete  Culture, blood (Routine X 2) w Reflex to ID Panel     Status: None (Preliminary result)   Collection Time: 10/11/19  9:33 AM   Specimen: BLOOD LEFT ARM  Result Value Ref Range Status   Specimen Description BLOOD LEFT ARM BOTTLES DRAWN AEROBIC AND ANAEROBIC  Final   Special Requests Blood Culture adequate volume  Final   Culture   Final    NO GROWTH < 24 HOURS Performed at [facility], [address] [place], [place] [address]    Report Status PENDING  Incomplete  Culture, Urine     Status: Abnormal   Collection Time: 10/11/19  9:52 AM   Specimen: Urine, Clean Catch  Result Value Ref Range Status   Specimen Description   Final    URINE, CLEAN CATCH Performed at [facility], [address] [place], [place] [address]    Special Requests   Final    NONE Performed at [facility], [address], [place],  [address]    Culture (A)  Final    <10,000 COLONIES/mL INSIGNIFICANT GROWTH Performed at [facility], [place]  [address], [place], [place] [address]    Report Status 10/12/2019 FINAL  Final  CSF culture with Stat gram stain     Status: None (Preliminary result)   Collection Time: 10/11/19  1:22 PM   Specimen: CSF; Cerebrospinal Fluid  Result Value Ref Range Status   Specimen Description   Final    CSF Performed at [facility], [address] [place], [place] [address]    Special Requests   Final    NONE Performed at [facility], [address], [place], [place] [address]    Gram Stain   Final    NO ORGANISMS SEEN WBC PRESENT, PREDOMINANTLY MONONUCLEAR CYTOSPIN SMEAR Performed at [facility], [address] [place], [place] [address]    Culture   Final    NO GROWTH 2 DAYS Performed at [facility], [place] [address], [place], [place] [address]    Report Status PENDING  Incomplete     Labs: Basic Metabolic Panel: Recent Labs  Lab 10/08/19 0539 10/08/19 0539  10/09/19 0540 10/09/19 0540 10/10/19 0447 10/10/19 0447 10/11/19 0543 10/11/19 0543 10/12/19 0801 10/13/19 0559  NA 138   < > 138  --  141  --  141  --  142 138  K 3.5   < > 3.0*   < > 4.4   < > 3.2*   < > 3.3* 3.5  CL 108   < > 114*  --  116*  --  113*  --  114* 113*  CO2 20*   < > 15*  --  15*  --  18*  --  19* 17*  GLUCOSE 129*   < > 100*  --  92  --  103*  --  111* 111*  BUN 36*   < > 30*  --  30*  --  28*  --  30* 31*  CREATININE 1.87*   < > 1.66*  --  1.49*  --  1.34*  --  1.30* 1.10  CALCIUM 8.2*   < > 7.9*  --  8.3*  --  8.3*  --  8.5* 8.1*  MG 2.2   < > 1.8  --  2.5*  --  1.9  --  2.0 2.0  PHOS 2.5  --  2.3*  --  1.9*  --  2.9  --  3.0  --    < > = values in this interval not displayed.   Liver Function Tests: Recent Labs  Lab 10/08/19 0539 10/09/19 0540 10/10/19 0447 10/11/19 0543 10/12/19 0801  AST 50* 46* 62* 84* 145*  ALT 34 33 32 55* 128*  ALKPHOS 72 63 63 61 70  BILITOT 0.5 0.4 1.6* 0.6 0.6  PROT 6.4* 6.1* 6.7 6.4* 6.3*  ALBUMIN 3.1* 2.9* 3.0* 2.9* 2.8*   No results for input(s): LIPASE, AMYLASE in the last 168 hours. Recent Labs  Lab 10/10/19 0447 10/11/19 0543  AMMONIA 41* 18   CBC: Recent Labs  Lab 10/08/19 0539 10/09/19 0540 10/10/19 0447 10/11/19 0543 10/12/19 0801  WBC 6.0 5.7 6.2 7.4 8.6  NEUTROABS 4.6 4.1 4.8 6.1 7.4  HGB 13.5 12.6* 13.7 12.7* 12.9*  HCT 39.6 38.0* 42.0 38.4* 39.1  MCV 95.0 95.5 98.6 97.5 95.8  PLT 170 157 70* 181 197   Cardiac Enzymes: No results for input(s): CKTOTAL, CKMB, CKMBINDEX, TROPONINI in the last 168 hours. BNP: Invalid input(s): POCBNP CBG: No results for input(s): GLUCAP in the last 168 hours.  Time coordinating discharge:  36 minutes  Signed:  Orson Eva, DO [facility] Pager: [phone] 10/13/2019, 11:17 AM

## 2019-10-14 DIAGNOSIS — I10 Essential (primary) hypertension: Secondary | ICD-10-CM | POA: Diagnosis not present

## 2019-10-14 DIAGNOSIS — J439 Emphysema, unspecified: Secondary | ICD-10-CM | POA: Diagnosis not present

## 2019-10-14 LAB — CSF CULTURE W GRAM STAIN
Culture: NO GROWTH
Gram Stain: NONE SEEN

## 2019-10-15 LAB — VDRL, CSF: VDRL Quant, CSF: NONREACTIVE

## 2019-10-16 DIAGNOSIS — I10 Essential (primary) hypertension: Secondary | ICD-10-CM | POA: Diagnosis not present

## 2019-10-16 DIAGNOSIS — J439 Emphysema, unspecified: Secondary | ICD-10-CM | POA: Diagnosis not present

## 2019-10-16 LAB — CULTURE, BLOOD (ROUTINE X 2)
Culture: NO GROWTH
Culture: NO GROWTH
Special Requests: ADEQUATE
Special Requests: ADEQUATE

## 2019-10-21 DIAGNOSIS — I10 Essential (primary) hypertension: Secondary | ICD-10-CM | POA: Diagnosis not present

## 2019-10-21 DIAGNOSIS — J439 Emphysema, unspecified: Secondary | ICD-10-CM | POA: Diagnosis not present

## 2019-10-22 ENCOUNTER — Other Ambulatory Visit: Payer: Self-pay

## 2019-10-22 ENCOUNTER — Emergency Department (HOSPITAL_COMMUNITY): Payer: No Typology Code available for payment source

## 2019-10-22 ENCOUNTER — Emergency Department (HOSPITAL_COMMUNITY)
Admission: EM | Admit: 2019-10-22 | Discharge: 2019-10-22 | Disposition: A | Discharge status: Skilled Nursing Facility | Payer: No Typology Code available for payment source | Attending: Emergency Medicine | Admitting: Emergency Medicine

## 2019-10-22 ENCOUNTER — Encounter (HOSPITAL_COMMUNITY): Payer: Self-pay

## 2019-10-22 DIAGNOSIS — N183 Chronic kidney disease, stage 3 unspecified: Secondary | ICD-10-CM | POA: Diagnosis not present

## 2019-10-22 DIAGNOSIS — J439 Emphysema, unspecified: Secondary | ICD-10-CM | POA: Diagnosis not present

## 2019-10-22 DIAGNOSIS — I129 Hypertensive chronic kidney disease with stage 1 through stage 4 chronic kidney disease, or unspecified chronic kidney disease: Secondary | ICD-10-CM | POA: Diagnosis not present

## 2019-10-22 DIAGNOSIS — Z8616 Personal history of COVID-19: Secondary | ICD-10-CM | POA: Insufficient documentation

## 2019-10-22 DIAGNOSIS — R079 Chest pain, unspecified: Secondary | ICD-10-CM | POA: Diagnosis not present

## 2019-10-22 DIAGNOSIS — R072 Precordial pain: Secondary | ICD-10-CM

## 2019-10-22 DIAGNOSIS — R0789 Other chest pain: Secondary | ICD-10-CM | POA: Diagnosis present

## 2019-10-22 LAB — CBC
HCT: 41 % (ref 39.0–52.0)
Hemoglobin: 13.4 g/dL (ref 13.0–17.0)
MCH: 32.1 pg (ref 26.0–34.0)
MCHC: 32.7 g/dL (ref 30.0–36.0)
MCV: 98.1 fL (ref 80.0–100.0)
Platelets: 248 10*3/uL (ref 150–400)
RBC: 4.18 MIL/uL — ABNORMAL LOW (ref 4.22–5.81)
RDW: 13.5 % (ref 11.5–15.5)
WBC: 10.9 10*3/uL — ABNORMAL HIGH (ref 4.0–10.5)
nRBC: 0 % (ref 0.0–0.2)

## 2019-10-22 LAB — TROPONIN I (HIGH SENSITIVITY)
Troponin I (High Sensitivity): 8 ng/L (ref <18)
Troponin I (High Sensitivity): 8 ng/L (ref <18)

## 2019-10-22 LAB — BASIC METABOLIC PANEL
Anion gap: 10 (ref 5–15)
BUN: 24 mg/dL — ABNORMAL HIGH (ref 8–23)
CO2: 19 mmol/L — ABNORMAL LOW (ref 22–32)
Calcium: 9 mg/dL (ref 8.9–10.3)
Chloride: 104 mmol/L (ref 98–111)
Creatinine, Ser: 1.48 mg/dL — ABNORMAL HIGH (ref 0.61–1.24)
GFR calc Af Amer: 54 mL/min — ABNORMAL LOW (ref >60)
GFR calc non Af Amer: 47 mL/min — ABNORMAL LOW (ref >60)
Glucose, Bld: 88 mg/dL (ref 70–99)
Potassium: 4.4 mmol/L (ref 3.5–5.1)
Sodium: 133 mmol/L — ABNORMAL LOW (ref 135–145)

## 2019-10-22 MED ORDER — IOHEXOL 350 MG/ML SOLN
100.0000 mL | Freq: Once | INTRAVENOUS | Status: AC | PRN
  Administered 2019-10-22: 100 mL via INTRAVENOUS

## 2019-10-22 NOTE — ED Notes (Signed)
Patient transported to CT scan . 

## 2019-10-22 NOTE — Discharge Instructions (Signed)
You were seen in the emergency department today with chest discomfort.  Your labs for heart attack were normal.  We did a CT scan of your chest looking for blood clots which was also normal.  Your COVID-19 pneumonia is improving on the CT scan.  Please continue rehab and return to the emergency department if you develop any new or suddenly worsening symptoms.  Please call your primary care doctor tomorrow to schedule the next available follow-up appointment or make the rehab staff aware that you should be evaluated by a staff physician following your ED visit.

## 2019-10-22 NOTE — ED Triage Notes (Signed)
Pt brought to ED via [facility] from [facility] in [place] for chest pain. Pt diagnosed with covid 10/06/17. Pt was in physical therapy and started having chest pain, gave one nitro with relief.

## 2019-10-22 NOTE — ED Notes (Signed)
Patient's brief changed. Family contacted about patient's discharge.

## 2019-10-22 NOTE — ED Provider Notes (Signed)
Emergency Department Provider Note   I have reviewed the triage vital signs and the nursing notes.   HISTORY  Chief Complaint Covid Positive and Chest Pain   HPI Albert Hahn is a 74 y.o. male with recent COVID 19 diagnosis s/p hospitalization and Decadron course presents to the emergency department today from PT at the [facility] for CP during PT. patient apparently began to complain of central chest discomfort during physical therapy.  He was given 1 nitroglycerin and was able to rest at which point the pain went away.  Patient describes a central pain in his chest without radiation.  He denies shortness of breath or fever.  No similar pain in the past like this.   Past Medical History:  Diagnosis Date  . Anxiety   . Arthritis   . Complication of anesthesia    woke up during lymph node removal, facial surgery  . Depression   . H/O emphysema   . Headache(784.0)   . History of hiatal hernia    "doesnt bother me"  . Hypertension   . 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 PTSD (post-traumatic stress disorder)     Patient Active Problem List   Diagnosis Date Noted  . Fever 10/11/2019  . Acute encephalopathy   . Transaminasemia   . Acute metabolic encephalopathy 96/28/3662  . Acute renal failure superimposed on stage 3a chronic kidney disease ([place]) 10/09/2019  . Acute respiratory disease due to COVID-19 virus 10/07/2019  . Acute hypokalemia 10/07/2019  . SIRS (systemic inflammatory response syndrome) ([place]) 10/07/2019  . COVID-19 virus infection 10/07/2019  . 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 Non Hodgkin's lymphoma ([place]) 12/29/2011    Past Surgical History:  Procedure Laterality Date  . BACK SURGERY     L3-L4 Fusion per pt  . COLONOSCOPY W/ POLYPECTOMY    . HERNIA REPAIR    . HIP SURGERY    . JOINT REPLACEMENT    . LUMBAR PUNCTURE  07/23/2012   severe headaches  . NECK SURGERY     metal plates and screws in neck   . TOTAL HIP ARTHROPLASTY    .  VENTRICULOPERITONEAL SHUNT N/A 11/25/2016   Procedure: SHUNT INSERTION VENTRICULAR-PERITONEAL;  Surgeon: Eustace Moore, MD;  Location: [place];  Service: Neurosurgery;  Laterality: N/A;    Allergies Bee venom, Shellfish allergy, and Codeine  No family history on file.  Social History Social History   Tobacco Use  . Smoking status: Former Research scientist (life sciences)  . Smokeless tobacco: Never Used  . Tobacco comment: Stopped smoking 29 years ago   Substance Use Topics  . Alcohol use: No    Alcohol/week: 1.0 standard drinks    Types: 1 Glasses of wine per week  . Drug use: No    Review of Systems  Constitutional: No fever/chills Eyes: No visual changes. ENT: No sore throat. Cardiovascular: Positive chest pain. Respiratory: Denies shortness of breath. Gastrointestinal: No abdominal pain.  No nausea, no vomiting.  No diarrhea.  No constipation. Genitourinary: Negative for dysuria. Musculoskeletal: Negative for back pain. Skin: Negative for rash. Neurological: Negative for headaches.  10-point ROS otherwise negative.  ____________________________________________   PHYSICAL EXAM:  VITAL SIGNS: ED Triage Vitals  Enc Vitals Group     BP 10/22/19 1518 (!) 153/85     Pulse Rate 10/22/19 1518 92     Resp 10/22/19 1518 (!) 27     Temp 10/22/19 1518 99.2 F (37.3 C)     Temp  Source 10/22/19 1518 Oral     SpO2 10/22/19 1518 98 %     Weight 10/22/19 1516 205 lb 7.5 oz (93.2 kg)     Height 10/22/19 1516 5\' 7"  (1.702 m)   Constitutional: Alert and oriented. Well appearing and in no acute distress. Eyes: Conjunctivae are normal.  Head: Atraumatic. Nose: No congestion/rhinnorhea. Mouth/Throat: Mucous membranes are moist. Neck: No stridor.  Cardiovascular: Normal rate, regular rhythm. Good peripheral circulation. Grossly normal heart sounds.   Respiratory: Normal respiratory effort.  No retractions. Lungs CTAB. Gastrointestinal: Soft and nontender. No distention.  Musculoskeletal: No gross  deformities of extremities. Neurologic:  Normal speech and language.  Skin:  Skin is warm, dry and intact. No rash noted.  ____________________________________________   LABS (all labs ordered are listed, but only abnormal results are displayed)  Labs Reviewed  BASIC METABOLIC PANEL - Abnormal; Notable for the following components:      Result Value   Sodium 133 (*)    CO2 19 (*)    BUN 24 (*)    Creatinine, Ser 1.48 (*)    GFR calc non Af Amer 47 (*)    GFR calc Af Amer 54 (*)    All other components within normal limits  CBC - Abnormal; Notable for the following components:   WBC 10.9 (*)    RBC 4.18 (*)    All other components within normal limits  TROPONIN I (HIGH SENSITIVITY)  TROPONIN I (HIGH SENSITIVITY)   ____________________________________________  EKG   EKG Interpretation  Date/Time:  Tuesday October 22 2019 15:20:25 EST Ventricular Rate:  91 PR Interval:    QRS Duration: 73 QT Interval:  351 QTC Calculation: 432 R Axis:   2 Text Interpretation: Sinus rhythm Borderline prolonged PR interval Low voltage, precordial leads Borderline T abnormalities, anterior leads No STEMI Confirmed by Nanda Quinton [phone]) on 10/22/2019 4:03:45 PM       ____________________________________________  RADIOLOGY  CT Angio Chest PE W and/or Wo Contrast  Result Date: 10/22/2019 CLINICAL DATA:  PE suspected, high prob Chest pain. Diagnosed with COVID 2 weeks ago. EXAM: CT ANGIOGRAPHY CHEST WITH CONTRAST TECHNIQUE: Multidetector CT imaging of the chest was performed using the standard protocol during bolus administration of intravenous contrast. Multiplanar CT image reconstructions and MIPs were obtained to evaluate the vascular anatomy. CONTRAST:  14mL OMNIPAQUE IOHEXOL 350 MG/ML SOLN COMPARISON:  Chest CT 10/11/2019, radiograph earlier this day. FINDINGS: Cardiovascular: Motion limited evaluation for pulmonary embolus. Apices and basilar evaluation is particularly limited. Allowing  for limitations, no discrete filling defects in the pulmonary arteries to suggest pulmonary embolus. Aortic atherosclerosis without aortic dissection. 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 Mild cardiomegaly with coronary artery calcifications. Mediastinum/Nodes: Small mediastinal lymph nodes, all subcentimeter, likely reactive. No enlarged hilar lymph nodes. No esophageal wall thickening. Tiny hiatal hernia. No thyroid nodule. Lungs/Pleura: Breathing motion artifact limits assessment. Persistent but improved peripheral ground-glass opacities involving the right greater than left lung. Mild dependent atelectasis with mild pleural thickening, no discrete pleural effusion. Upper Abdomen: Calcified hepatic granuloma. No acute upper abdominal findings. Musculoskeletal: There are no acute or suspicious osseous abnormalities. Degenerative change in the spine. Postsurgical change in the lower cervical spine, partially included. Intra-articular bodies posterior to the right shoulder. Review of the MIP images confirms the above findings. IMPRESSION: 1. Motion limited exam. Allowing for motion limitations, no filling defects in the pulmonary arteries to suggest pulmonary embolus. 2. Persistent but improved peripheral ground-glass opacities in the right  greater than left lung. Findings consistent with sequela of COVID pneumonia. Mild dependent atelectasis. 3. Cardiomegaly with coronary artery calcifications. 4. Aortic tortuosity with use warm aneurysmal dilatation of the ascending aorta, maximal dimension 4.2 cm. Recommend annual imaging followup by CTA or MRA. This recommendation follows 2010 ACCF/AHA/AATS/ACR/ASA/SCA/SCAI/SIR/STS/SVM Guidelines for the Diagnosis and Management of Patients with Thoracic Aortic Disease. Circulation. 2010; 121: V893-Y101. Aortic aneurysm NOS (ICD10-I71.9) Aortic Atherosclerosis (ICD10-I70.0). Electronically Signed   By: Keith Rake M.D.   On: 10/22/2019 20:17    DG Chest Portable 1 View  Result Date: 10/22/2019 CLINICAL DATA:  Chest pain relieved with nitroglycerin EXAM: PORTABLE CHEST 1 VIEW COMPARISON:  10/08/2019, 10/11/2019 FINDINGS: Single frontal view of the chest demonstrates stable enlargement of the cardiac silhouette. Continued ectasia of the thoracic aorta. Ventriculostomy catheter overlies right chest. No airspace disease, effusion, or pneumothorax. No acute bony abnormalities. IMPRESSION: 1. No acute intrathoracic process. Electronically Signed   By: Randa Ngo M.D.   On: 10/22/2019 16:25    ____________________________________________   PROCEDURES  Procedure(s) performed:   Procedures  None  ____________________________________________   INITIAL IMPRESSION / ASSESSMENT AND PLAN / ED COURSE  Pertinent labs & imaging results that were available during my care of the patient were reviewed by me and considered in my medical decision making (see chart for details).   Patient presents emergency department for evaluation of chest pain during physical therapy which improved with nitroglycerin and rest.  His EKG shows no acute ischemic findings.  Post COVID-19 I am considering PE is a possibility but his oxygen levels are normal along with his heart rate.  Labs reviewed.  Delta troponin stable at 8.  With patient's recent COVID-19 infection I did obtain a CT scan of the chest which was motion degraded but did not find obvious PE.  Patient's infiltrates are improving but remain present which would be expected.  Patient is not hypoxemic or tachycardic.  He is doing well and continues to have no chest pain.  I called and discussed the ED presentation and work-up with his wife by phone.  At this time, I feel the patient is stable for discharge and return to [facility] to continue his rehabilitation process.  Discussed ED return precautions.  ____________________________________________  FINAL CLINICAL IMPRESSION(S) / ED  DIAGNOSES  Final diagnoses:  Precordial pain     MEDICATIONS GIVEN DURING THIS VISIT:  Medications  iohexol (OMNIPAQUE) 350 MG/ML injection 100 mL (100 mLs Intravenous Contrast Given 10/22/19 1948)     Note:  This document was prepared using Dragon voice recognition software and may include unintentional dictation errors.  Nanda Quinton, MD, [facility] Emergency Medicine    Tarahji Ramthun, Wonda [facility], MD 10/22/19 2111

## 2019-10-25 DIAGNOSIS — I10 Essential (primary) hypertension: Secondary | ICD-10-CM | POA: Diagnosis not present

## 2019-10-26 ENCOUNTER — Other Ambulatory Visit: Payer: Self-pay

## 2019-10-26 ENCOUNTER — Ambulatory Visit
Admission: EM | Admit: 2019-10-26 | Discharge: 2019-10-26 | Disposition: A | Discharge status: Home / Self Care | Payer: No Typology Code available for payment source | Source: Home / Self Care

## 2019-10-26 DIAGNOSIS — Z8572 Personal history of non-Hodgkin lymphomas: Secondary | ICD-10-CM | POA: Diagnosis not present

## 2019-10-26 DIAGNOSIS — J439 Emphysema, unspecified: Secondary | ICD-10-CM | POA: Diagnosis not present

## 2019-10-26 DIAGNOSIS — R531 Weakness: Secondary | ICD-10-CM | POA: Diagnosis not present

## 2019-10-26 DIAGNOSIS — I129 Hypertensive chronic kidney disease with stage 1 through stage 4 chronic kidney disease, or unspecified chronic kidney disease: Secondary | ICD-10-CM | POA: Diagnosis not present

## 2019-10-26 DIAGNOSIS — N139 Obstructive and reflux uropathy, unspecified: Secondary | ICD-10-CM | POA: Diagnosis not present

## 2019-10-26 DIAGNOSIS — Z982 Presence of cerebrospinal fluid drainage device: Secondary | ICD-10-CM | POA: Diagnosis not present

## 2019-10-26 DIAGNOSIS — I2699 Other pulmonary embolism without acute cor pulmonale: Secondary | ICD-10-CM | POA: Diagnosis not present

## 2019-10-26 DIAGNOSIS — Z87891 Personal history of nicotine dependence: Secondary | ICD-10-CM | POA: Diagnosis not present

## 2019-10-26 DIAGNOSIS — N189 Chronic kidney disease, unspecified: Secondary | ICD-10-CM | POA: Diagnosis not present

## 2019-10-26 DIAGNOSIS — G919 Hydrocephalus, unspecified: Secondary | ICD-10-CM | POA: Diagnosis not present

## 2019-10-26 NOTE — ED Triage Notes (Signed)
Covid x 3 weeks ago, also experienced some kidney failure and low potassium, for which he was hospitalized 1 week ago. Pt has been back at nursing home and having hematuria x3 days. Nursing home did take a sample but urine test doesnt come back until monday

## 2019-10-28 ENCOUNTER — Emergency Department (HOSPITAL_COMMUNITY): Payer: No Typology Code available for payment source

## 2019-10-28 ENCOUNTER — Other Ambulatory Visit: Payer: Self-pay

## 2019-10-28 ENCOUNTER — Inpatient Hospital Stay (HOSPITAL_COMMUNITY)
Admission: EM | Admit: 2019-10-28 | Discharge: 2019-10-31 | DRG: 871 | Disposition: A | Discharge status: Home Health | Payer: No Typology Code available for payment source | Attending: Family Medicine | Admitting: Family Medicine

## 2019-10-28 ENCOUNTER — Encounter (HOSPITAL_COMMUNITY): Payer: Self-pay | Admitting: Emergency Medicine

## 2019-10-28 DIAGNOSIS — R54 Age-related physical debility: Secondary | ICD-10-CM | POA: Diagnosis present

## 2019-10-28 DIAGNOSIS — G9340 Encephalopathy, unspecified: Secondary | ICD-10-CM

## 2019-10-28 DIAGNOSIS — Z885 Allergy status to narcotic agent status: Secondary | ICD-10-CM | POA: Diagnosis not present

## 2019-10-28 DIAGNOSIS — Z7189 Other specified counseling: Secondary | ICD-10-CM | POA: Diagnosis not present

## 2019-10-28 DIAGNOSIS — J439 Emphysema, unspecified: Secondary | ICD-10-CM | POA: Diagnosis not present

## 2019-10-28 DIAGNOSIS — F05 Delirium due to known physiological condition: Secondary | ICD-10-CM | POA: Diagnosis not present

## 2019-10-28 DIAGNOSIS — Z515 Encounter for palliative care: Secondary | ICD-10-CM | POA: Diagnosis not present

## 2019-10-28 DIAGNOSIS — N179 Acute kidney failure, unspecified: Secondary | ICD-10-CM | POA: Diagnosis present

## 2019-10-28 DIAGNOSIS — N184 Chronic kidney disease, stage 4 (severe): Secondary | ICD-10-CM | POA: Diagnosis present

## 2019-10-28 DIAGNOSIS — I10 Essential (primary) hypertension: Secondary | ICD-10-CM | POA: Diagnosis not present

## 2019-10-28 DIAGNOSIS — I959 Hypotension, unspecified: Secondary | ICD-10-CM | POA: Diagnosis present

## 2019-10-28 DIAGNOSIS — I1310 Hypertensive heart and chronic kidney disease without heart failure, with stage 1 through stage 4 chronic kidney disease, or unspecified chronic kidney disease: Secondary | ICD-10-CM | POA: Diagnosis present

## 2019-10-28 DIAGNOSIS — Z9221 Personal history of antineoplastic chemotherapy: Secondary | ICD-10-CM | POA: Diagnosis not present

## 2019-10-28 DIAGNOSIS — I739 Peripheral vascular disease, unspecified: Secondary | ICD-10-CM | POA: Diagnosis present

## 2019-10-28 DIAGNOSIS — R410 Disorientation, unspecified: Secondary | ICD-10-CM | POA: Diagnosis not present

## 2019-10-28 DIAGNOSIS — N1831 Chronic kidney disease, stage 3a: Secondary | ICD-10-CM | POA: Diagnosis present

## 2019-10-28 DIAGNOSIS — Z8616 Personal history of COVID-19: Secondary | ICD-10-CM | POA: Diagnosis not present

## 2019-10-28 DIAGNOSIS — G9341 Metabolic encephalopathy: Secondary | ICD-10-CM | POA: Diagnosis present

## 2019-10-28 DIAGNOSIS — Z981 Arthrodesis status: Secondary | ICD-10-CM

## 2019-10-28 DIAGNOSIS — Z79899 Other long term (current) drug therapy: Secondary | ICD-10-CM

## 2019-10-28 DIAGNOSIS — F329 Major depressive disorder, single episode, unspecified: Secondary | ICD-10-CM | POA: Diagnosis present

## 2019-10-28 DIAGNOSIS — E860 Dehydration: Secondary | ICD-10-CM | POA: Diagnosis present

## 2019-10-28 DIAGNOSIS — A419 Sepsis, unspecified organism: Secondary | ICD-10-CM | POA: Diagnosis present

## 2019-10-28 DIAGNOSIS — F4310 Post-traumatic stress disorder, unspecified: Secondary | ICD-10-CM | POA: Diagnosis present

## 2019-10-28 DIAGNOSIS — L89311 Pressure ulcer of right buttock, stage 1: Secondary | ICD-10-CM | POA: Diagnosis present

## 2019-10-28 DIAGNOSIS — Z87891 Personal history of nicotine dependence: Secondary | ICD-10-CM

## 2019-10-28 DIAGNOSIS — Z743 Need for continuous supervision: Secondary | ICD-10-CM | POA: Diagnosis not present

## 2019-10-28 DIAGNOSIS — R609 Edema, unspecified: Secondary | ICD-10-CM | POA: Diagnosis not present

## 2019-10-28 DIAGNOSIS — Z96642 Presence of left artificial hip joint: Secondary | ICD-10-CM | POA: Diagnosis present

## 2019-10-28 DIAGNOSIS — R509 Fever, unspecified: Secondary | ICD-10-CM

## 2019-10-28 DIAGNOSIS — N390 Urinary tract infection, site not specified: Secondary | ICD-10-CM | POA: Diagnosis present

## 2019-10-28 DIAGNOSIS — E876 Hypokalemia: Secondary | ICD-10-CM | POA: Diagnosis present

## 2019-10-28 DIAGNOSIS — Z8572 Personal history of non-Hodgkin lymphomas: Secondary | ICD-10-CM | POA: Diagnosis not present

## 2019-10-28 DIAGNOSIS — Z9181 History of falling: Secondary | ICD-10-CM

## 2019-10-28 DIAGNOSIS — Z66 Do not resuscitate: Secondary | ICD-10-CM | POA: Diagnosis present

## 2019-10-28 DIAGNOSIS — G912 (Idiopathic) normal pressure hydrocephalus: Secondary | ICD-10-CM | POA: Diagnosis present

## 2019-10-28 DIAGNOSIS — Z982 Presence of cerebrospinal fluid drainage device: Secondary | ICD-10-CM

## 2019-10-28 DIAGNOSIS — R404 Transient alteration of awareness: Secondary | ICD-10-CM | POA: Diagnosis not present

## 2019-10-28 LAB — URINALYSIS, ROUTINE W REFLEX MICROSCOPIC
Bilirubin Urine: NEGATIVE
Glucose, UA: NEGATIVE mg/dL
Ketones, ur: NEGATIVE mg/dL
Nitrite: POSITIVE — AB
Protein, ur: 100 mg/dL — AB
Specific Gravity, Urine: 1.013 (ref 1.005–1.030)
pH: 7 (ref 5.0–8.0)

## 2019-10-28 LAB — COMPREHENSIVE METABOLIC PANEL
ALT: 61 U/L — ABNORMAL HIGH (ref 0–44)
AST: 55 U/L — ABNORMAL HIGH (ref 15–41)
Albumin: 2.7 g/dL — ABNORMAL LOW (ref 3.5–5.0)
Alkaline Phosphatase: 126 U/L (ref 38–126)
Anion gap: 13 (ref 5–15)
BUN: 27 mg/dL — ABNORMAL HIGH (ref 8–23)
CO2: 20 mmol/L — ABNORMAL LOW (ref 22–32)
Calcium: 8.5 mg/dL — ABNORMAL LOW (ref 8.9–10.3)
Chloride: 100 mmol/L (ref 98–111)
Creatinine, Ser: 1.82 mg/dL — ABNORMAL HIGH (ref 0.61–1.24)
GFR calc Af Amer: 42 mL/min — ABNORMAL LOW (ref >60)
GFR calc non Af Amer: 36 mL/min — ABNORMAL LOW (ref >60)
Glucose, Bld: 108 mg/dL — ABNORMAL HIGH (ref 70–99)
Potassium: 3 mmol/L — ABNORMAL LOW (ref 3.5–5.1)
Sodium: 133 mmol/L — ABNORMAL LOW (ref 135–145)
Total Bilirubin: 0.5 mg/dL (ref 0.3–1.2)
Total Protein: 7.3 g/dL (ref 6.5–8.1)

## 2019-10-28 LAB — CBC WITH DIFFERENTIAL/PLATELET
Abs Immature Granulocytes: 0.13 10*3/uL — ABNORMAL HIGH (ref 0.00–0.07)
Basophils Absolute: 0 10*3/uL (ref 0.0–0.1)
Basophils Relative: 0 %
Eosinophils Absolute: 0 10*3/uL (ref 0.0–0.5)
Eosinophils Relative: 0 %
HCT: 37.8 % — ABNORMAL LOW (ref 39.0–52.0)
Hemoglobin: 12.7 g/dL — ABNORMAL LOW (ref 13.0–17.0)
Immature Granulocytes: 1 %
Lymphocytes Relative: 9 %
Lymphs Abs: 0.9 10*3/uL (ref 0.7–4.0)
MCH: 32 pg (ref 26.0–34.0)
MCHC: 33.6 g/dL (ref 30.0–36.0)
MCV: 95.2 fL (ref 80.0–100.0)
Monocytes Absolute: 0.8 10*3/uL (ref 0.1–1.0)
Monocytes Relative: 8 %
Neutro Abs: 8.3 10*3/uL — ABNORMAL HIGH (ref 1.7–7.7)
Neutrophils Relative %: 82 %
Platelets: 237 10*3/uL (ref 150–400)
RBC: 3.97 MIL/uL — ABNORMAL LOW (ref 4.22–5.81)
RDW: 13.4 % (ref 11.5–15.5)
WBC: 10.2 10*3/uL (ref 4.0–10.5)
nRBC: 0 % (ref 0.0–0.2)

## 2019-10-28 LAB — PROTIME-INR
INR: 1.2 (ref 0.8–1.2)
Prothrombin Time: 14.7 seconds (ref 11.4–15.2)

## 2019-10-28 LAB — APTT: aPTT: 34 seconds (ref 24–36)

## 2019-10-28 LAB — LACTIC ACID, PLASMA
Lactic Acid, Venous: 1.3 mmol/L (ref 0.5–1.9)
Lactic Acid, Venous: 1.5 mmol/L (ref 0.5–1.9)

## 2019-10-28 MED ORDER — SODIUM CHLORIDE 0.9 % IV SOLN
2.0000 g | Freq: Once | INTRAVENOUS | Status: AC
  Administered 2019-10-28: 2 g via INTRAVENOUS
  Filled 2019-10-28: qty 2

## 2019-10-28 MED ORDER — DOCUSATE SODIUM 100 MG PO CAPS
500.0000 mg | ORAL_CAPSULE | Freq: Every day | ORAL | Status: DC
  Administered 2019-10-29: 500 mg via ORAL
  Filled 2019-10-28: qty 5

## 2019-10-28 MED ORDER — TOPIRAMATE 100 MG PO TABS
100.0000 mg | ORAL_TABLET | Freq: Every day | ORAL | Status: DC
  Administered 2019-10-28 – 2019-10-30 (×3): 100 mg via ORAL
  Filled 2019-10-28 (×3): qty 1

## 2019-10-28 MED ORDER — HYDROCHLOROTHIAZIDE 25 MG PO TABS: 25.0000 mg | ORAL_TABLET | Freq: Every day | ORAL | Status: DC

## 2019-10-28 MED ORDER — ACETAMINOPHEN 500 MG PO TABS: 500.0000 mg | ORAL_TABLET | Freq: Four times a day (QID) | ORAL | Status: DC | PRN

## 2019-10-28 MED ORDER — ENOXAPARIN SODIUM 40 MG/0.4ML ~~LOC~~ SOLN
40.0000 mg | SUBCUTANEOUS | Status: DC
  Administered 2019-10-28 – 2019-10-30 (×3): 40 mg via SUBCUTANEOUS
  Filled 2019-10-28 (×3): qty 0.4

## 2019-10-28 MED ORDER — TEMAZEPAM 15 MG PO CAPS
15.0000 mg | ORAL_CAPSULE | Freq: Every evening | ORAL | Status: DC | PRN
  Administered 2019-10-30: 15 mg via ORAL
  Filled 2019-10-28: qty 1

## 2019-10-28 MED ORDER — TERAZOSIN HCL 1 MG PO CAPS
4.0000 mg | ORAL_CAPSULE | Freq: Every day | ORAL | Status: DC
  Administered 2019-10-28 – 2019-10-30 (×3): 4 mg via ORAL
  Filled 2019-10-28 (×2): qty 4
  Filled 2019-10-28 (×2): qty 2
  Filled 2019-10-28: qty 4

## 2019-10-28 MED ORDER — SODIUM CHLORIDE 0.9 % IV SOLN: 2.0000 g | Freq: Two times a day (BID) | INTRAVENOUS | Status: DC

## 2019-10-28 MED ORDER — ACETAMINOPHEN 650 MG RE SUPP
650.0000 mg | Freq: Once | RECTAL | Status: AC
  Administered 2019-10-28: 650 mg via RECTAL
  Filled 2019-10-28: qty 1

## 2019-10-28 MED ORDER — ALBUTEROL SULFATE (2.5 MG/3ML) 0.083% IN NEBU
3.0000 mL | INHALATION_SOLUTION | RESPIRATORY_TRACT | Status: DC | PRN
  Administered 2019-10-28: 3 mL via RESPIRATORY_TRACT
  Filled 2019-10-28: qty 3

## 2019-10-28 MED ORDER — AMLODIPINE BESYLATE 5 MG PO TABS: 10.0000 mg | ORAL_TABLET | Freq: Every day | ORAL | Status: DC

## 2019-10-28 MED ORDER — AMITRIPTYLINE HCL 25 MG PO TABS
75.0000 mg | ORAL_TABLET | Freq: Every day | ORAL | Status: DC
  Administered 2019-10-28 – 2019-10-30 (×3): 75 mg via ORAL
  Filled 2019-10-28 (×5): qty 3

## 2019-10-28 MED ORDER — SODIUM CHLORIDE 0.9 % IV SOLN
1.0000 g | INTRAVENOUS | Status: AC
  Administered 2019-10-28 – 2019-10-30 (×3): 1 g via INTRAVENOUS
  Filled 2019-10-28 (×3): qty 10

## 2019-10-28 MED ORDER — SODIUM CHLORIDE 0.45 % IV SOLN: INTRAVENOUS | Status: DC

## 2019-10-28 MED ORDER — POLYVINYL ALCOHOL 1.4 % OP SOLN
1.0000 [drp] | Freq: Every day | OPHTHALMIC | Status: DC
  Administered 2019-10-29 – 2019-10-31 (×3): 1 [drp] via OPHTHALMIC
  Filled 2019-10-28 (×2): qty 15

## 2019-10-28 MED ORDER — POTASSIUM CHLORIDE CRYS ER 20 MEQ PO TBCR: 20.0000 meq | EXTENDED_RELEASE_TABLET | Freq: Every day | ORAL | Status: DC

## 2019-10-28 MED ORDER — ALLOPURINOL 100 MG PO TABS
100.0000 mg | ORAL_TABLET | Freq: Every day | ORAL | Status: DC
  Administered 2019-10-29 – 2019-10-31 (×3): 100 mg via ORAL
  Filled 2019-10-28 (×5): qty 1

## 2019-10-28 MED ORDER — OXYCODONE HCL 5 MG PO TABS: 5.0000 mg | ORAL_TABLET | Freq: Four times a day (QID) | ORAL | Status: DC | PRN

## 2019-10-28 MED ORDER — POLYETHYL GLYCOL-PROPYL GLYCOL 0.4-0.3 % OP SOLN: 1.0000 [drp] | Freq: Every day | OPHTHALMIC | Status: DC

## 2019-10-28 MED ORDER — POTASSIUM CHLORIDE CRYS ER 20 MEQ PO TBCR: 40.0000 meq | EXTENDED_RELEASE_TABLET | Freq: Once | ORAL | Status: DC

## 2019-10-28 MED ORDER — LACTATED RINGERS IV BOLUS
1000.0000 mL | Freq: Once | INTRAVENOUS | Status: AC
  Administered 2019-10-28: 1000 mL via INTRAVENOUS

## 2019-10-28 NOTE — H&P (Signed)
History and Physical    Albert Hahn PXT:[id number] DOB: [birth year] DOA: 10/28/2019  PCP: Asencion Noble, MD (Confirm with patient/family/NH records and if not entered, this has to be entered at [facility] point of entry) Patient coming from: Home  I have personally briefly reviewed patient's old medical records in [place]  Chief Complaint: Increased weakness  HPI: Albert Hahn is a 74 y.o. male with medical history significant of recent hospitalizaton for covid 19 PNA. He was discharged to [facility] 2/2 weakness and assist with ADLs. He was discharged home Friday 10/25/19 in good condition, awake and alert. Starting Saturday he became weak and family had a difficult time providing assistance. He developed increased weakness and foul smelling urine. EMS was called and brought patient to [facility] for evaluation. Per wife when EMT's tried to move him he cried out with pain in his legs. (For level 3, the HPI must include 4+ descriptors: Location, Quality, Severity, Duration, Timing, Context, modifying factors, associated signs/symptoms and/or status of 3+ chronic problems.)  (Please avoid self-populating past medical history here) (The initial 2-3 lines should be focused and good to copy and paste in the HPI section of the daily progress note).  ED Course:  Patient was mildly hypotensive to 103/63 which improved with IV fluids; he was febrile to 101.7. U/A was positive. WBC 10.2 with normal diff. He was tachycardic and mildly tachypneic . Code sepsis was called. Patient was given initial dose of cefipime. TRH called to admit patient for continued treatment.  Review of Systems: As per HPI otherwise 10 point review of systems negative. Caveat - patient with decreased responsiveness Unacceptable ROS statements: "10 systems reviewed," "Extensive" (without elaboration).  Acceptable ROS statements: "All others negative," "All others reviewed and are negative," and "All others unremarkable," with at  [place] documented Can't double dip - if using for HPI can't use for ROS  Past Medical History:  Diagnosis Date  . Anxiety   . Arthritis   . Complication of anesthesia    woke up during lymph node removal, facial surgery  . Depression   . H/O emphysema   . Headache(784.0)   . History of hiatal hernia    "doesnt bother me"  . Hypertension   . 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 PTSD (post-traumatic stress disorder)     Past Surgical History:  Procedure Laterality Date  . BACK SURGERY     L3-L4 Fusion per pt  . COLONOSCOPY W/ POLYPECTOMY    . HERNIA REPAIR    . HIP SURGERY    . JOINT REPLACEMENT    . LUMBAR PUNCTURE  07/23/2012   severe headaches  . NECK SURGERY     metal plates and screws in neck   . TOTAL HIP ARTHROPLASTY    . VENTRICULOPERITONEAL SHUNT N/A 11/25/2016   Procedure: SHUNT INSERTION VENTRICULAR-PERITONEAL;  Surgeon: Eustace Moore, MD;  Location: [facility];  Service: Neurosurgery;  Laterality: N/A;   Soc Hx - [place] with PTSD, exposed to agent orange. On VA disability and is seen in [place] every 60 days. Married 73 years, has two daughters, 4 grandchildren. 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 Lives with his wife. Was independent in ADLs, walked every day. Is a little reclusinve 2/2 PTSD.   reports that he has quit smoking. He has never used smokeless tobacco. He reports that he does  not drink alcohol or use drugs.  Allergies  Allergen Reactions  . Bee Venom Anaphylaxis  . Shellfish Allergy Anaphylaxis  . Codeine Nausea And Vomiting    Family History  Problem Relation Age of Onset  . Healthy Mother   . Healthy Father    Unacceptable: Noncontributory, unremarkable, or negative. Acceptable: Family history reviewed and not pertinent (If you reviewed it)  Prior to Admission medications   Medication Sig Start Date End Date Taking? Authorizing Provider  allopurinol  (ZYLOPRIM) 100 MG tablet Take 100 mg by mouth daily.   Yes [provider]  amitriptyline (ELAVIL) 50 MG tablet Take 1 tablet (50 mg total) by mouth at bedtime. Patient taking differently: Take 75 mg by mouth at bedtime.  10/13/19  Yes Tat, Shanon Brow, MD  amLODipine (NORVASC) 10 MG tablet Take 10 mg by mouth daily.   Yes [provider]  docusate sodium (COLACE) 100 MG capsule Take 500 mg by mouth daily.    Yes [provider]  hydrochlorothiazide (HYDRODIURIL) 25 MG tablet Take 25 mg by mouth daily.   Yes [provider]  oxyCODONE (OXY IR/ROXICODONE) 5 MG immediate release tablet Take 1 tablet (5 mg total) by mouth every 6 (six) hours as needed (for pain.). 10/13/19  Yes Tat, Shanon Brow, MD  Polyethyl Glycol-Propyl Glycol (LUBRICANT EYE DROPS) 0.4-0.3 % SOLN Place 1-2 drops into both eyes daily.   Yes [provider]  temazepam (RESTORIL) 15 MG capsule Take 15 mg by mouth at bedtime as needed for sleep.   Yes [provider]  terazosin (HYTRIN) 2 MG capsule Take 4 mg by mouth at bedtime.   Yes [provider]  topiramate (TOPAMAX) 100 MG tablet Take 100 mg by mouth daily. At bedtime   Yes [provider]  acetaminophen (TYLENOL) 500 MG tablet Take 500-1,000 mg by mouth every 6 (six) hours as needed for mild pain or moderate pain.    [provider]  dexamethasone (DECADRON) 6 MG tablet Take 1 tablet (6 mg total) by mouth daily. X 3 days Patient not taking: Reported on 10/28/2019 10/14/19   Orson Eva, MD    Physical Exam: Vitals:   10/28/19 1500 10/28/19 1530 10/28/19 1545 10/28/19 1600  BP: 121/66 129/66  103/63  Pulse: (!) 103  (!) 106 (!) 108  Resp: (!) 33 (!) 27 (!) 23 (!) 36  Temp:      TempSrc:      SpO2: 96%  96% 95%  Weight:      Height:        Constitutional: NAD, calm, comfortable Vitals:   10/28/19 1500 10/28/19 1530 10/28/19 1545 10/28/19 1600  BP: 121/66 129/66  103/63  Pulse: (!) 103  (!) 106 (!) 108    Resp: (!) 33 (!) 27 (!) 23 (!) 36  Temp:      TempSrc:      SpO2: 96%  96% 95%  Weight:      Height:       General: heavyset man in no distress. Awake but does not answer questions. Follows 1 step commands - slowly. Eyes: PERRL, lids and conjunctivae normal ENMT: Mucous membranes are moist. Posterior pharynx clear of any exudate or lesions.Normal dentition.  Neck: normal, supple, no masses, no thyromegaly Respiratory: clear to auscultation bilaterally, no wheezing, no crackles. Mildly increased respiratory effort. No accessory muscle use.  Cardiovascular: Regular rate and rhythm with premature beats, no murmurs / rubs / gallops. No extremity edema. 2+ pedal pulse left, trace to  absent right. Right foot is cool with slow capillary refill. No carotid bruits.  Abdomen: obese, no tenderness, no masses palpated. No hepatosplenomegaly. Bowel sounds hypoactive..  Musculoskeletal: no clubbing / cyanosis. No joint deformity upper and lower extremities. Good ROM, no contractures. Normal muscle tone.  Skin: no rashes, lesions, ulcers. No induration. Large surgical scar left flank Neurologic: CN 2-12 grossly intact. Sensation intact, DTR normal. Strength 5/5 in all 4.  Psychiatric: Minimally responsive. Awake, follows one step commands. Psycho-motor retardation noted.  (Anything < 9 systems with 2 bullets each down codes to level 1) (If patient refuses exam can't bill higher level) (Make sure to document decubitus ulcers present on admission -- if possible -- and whether patient has chronic indwelling catheter at time of admission)  Labs on Admission: I have personally reviewed following labs and imaging studies  CBC: Recent Labs  Lab 10/22/19 1514 10/28/19 1258  WBC 10.9* 10.2  NEUTROABS  --  8.3*  HGB 13.4 12.7*  HCT 41.0 37.8*  MCV 98.1 95.2  PLT 248 782   Basic Metabolic Panel: Recent Labs  Lab 10/22/19 1514 10/28/19 1258  NA 133* 133*  K 4.4 3.0*  CL 104 100  CO2 19* 20*   GLUCOSE 88 108*  BUN 24* 27*  CREATININE 1.48* 1.82*  CALCIUM 9.0 8.5*   GFR: Estimated Creatinine Clearance: 39.9 mL/min (A) (by C-G formula based on SCr of 1.82 mg/dL (H)). Liver Function Tests: Recent Labs  Lab 10/28/19 1258  AST 55*  ALT 61*  ALKPHOS 126  BILITOT 0.5  PROT 7.3  ALBUMIN 2.7*   No results for input(s): LIPASE, AMYLASE in the last 168 hours. No results for input(s): AMMONIA in the last 168 hours. Coagulation Profile: Recent Labs  Lab 10/28/19 1258  INR 1.2   Cardiac Enzymes: No results for input(s): CKTOTAL, CKMB, CKMBINDEX, TROPONINI in the last 168 hours. BNP (last 3 results) No results for input(s): PROBNP in the last 8760 hours. HbA1C: No results for input(s): HGBA1C in the last 72 hours. CBG: No results for input(s): GLUCAP in the last 168 hours. Lipid Profile: No results for input(s): CHOL, HDL, LDLCALC, TRIG, CHOLHDL, LDLDIRECT in the last 72 hours. Thyroid Function Tests: No results for input(s): TSH, T4TOTAL, FREET4, T3FREE, THYROIDAB in the last 72 hours. Anemia Panel: No results for input(s): VITAMINB12, FOLATE, FERRITIN, TIBC, IRON, RETICCTPCT in the last 72 hours. Urine analysis:    Component Value Date/Time   COLORURINE AMBER (A) 10/28/2019 1242   APPEARANCEUR HAZY (A) 10/28/2019 1242   LABSPEC 1.013 10/28/2019 1242   LABSPEC 1.025 09/06/2006 1104   PHURINE 7.0 10/28/2019 1242   GLUCOSEU NEGATIVE 10/28/2019 1242   HGBUR MODERATE (A) 10/28/2019 1242   BILIRUBINUR NEGATIVE 10/28/2019 1242   BILIRUBINUR Negative 09/06/2006 1104   KETONESUR NEGATIVE 10/28/2019 1242   PROTEINUR 100 (A) 10/28/2019 1242   UROBILINOGEN 1.0 08/20/2008 1340   NITRITE POSITIVE (A) 10/28/2019 1242   LEUKOCYTESUR LARGE (A) 10/28/2019 1242   LEUKOCYTESUR Trace 09/06/2006 1104    Radiological Exams on Admission: DG Abdomen Acute W/Chest  Result Date: 10/28/2019 CLINICAL DATA:  Fever and weakness EXAM: DG ABDOMEN ACUTE W/ 1V CHEST COMPARISON:  CT  abdomen pelvis 04/26/2017 FINDINGS: Heart size and vascularity normal. Lungs are clear without infiltrate or effusion. VP shunt tubing in the right chest extending into the mid abdomen without discontinuity. Normal bowel gas pattern. No bowel obstruction ileus or free air. Left hip replacement. IMPRESSION: Negative abdominal radiographs.  No acute cardiopulmonary disease. Electronically  Signed   By: Franchot Gallo M.D.   On: 10/28/2019 14:25    EKG: Independently reviewed. Sinus tachycardia w/o acute changes  Assessment/Plan Active Problems:   Sepsis secondary to UTI ([place])   Acute encephalopathy   PAD (peripheral artery disease) ([facility])   Acute hypokalemia   Acute UTI (urinary tract infection)  (please populate well all problems here in Problem List. (For example, if patient is on BP meds at home and you resume or decide to hold them, it is a problem that needs to be her. Same for CAD, COPD, HLD and so on)   1. Acute UTI with mild hypotension, tachycardia, tachypnea, but normal WBC and Lactic acid. Reviewed recent urine cultures - no growth 2/22, 2/28. He was inSNF Plan Med-surg admit  Rocephin 1 g IV q 24 pending urine cultures. Will adjust meds as indicated  Hydrate   2. Acute encephalopathy - patient with psychomotor retardation. Unable to give history. Does follow one step commands. Plan Treat underlying infection and reassess  3. Acute hypokalemia - patient with low K Plan Oral replacement  4.  PAD - right distal LE and foot cool to touch with slow capillary refill, trace to absent DP pulse Plan LE ABI  5. TOC - patient will need PT/OT eval and disposition to be determined as he responds to treatment.  6. Code status - per wife DNI but want Cardiac resuscitation.   DVT prophylaxis: lovenox (Lovenox/Heparin/SCD's/anticoagulated/None (if comfort care) Code Status: limited code: DNI (Full/Partial (specify details) Family Communication: Spoke with wife: explained dx and tx plan.  She reported that he was alert and active Friday 3/12 but declined over the w/e.  (Specify name, relationship. Do not write "discussed with patient". Specify tel # if discussed over the phone) Disposition Plan: TBD (specify when and where you expect patient to be discharged) Consults called: none (with names) Admission status: inpatient (inpatient / obs / tele / medical floor / SDU)   Adella Hare MD [facility] Pager [phone]  If 7PM-7AM, please contact night-coverage [email or web address] Password [facility]  10/28/2019, 5:07 PM

## 2019-10-28 NOTE — ED Provider Notes (Signed)
[facility] EMERGENCY DEPARTMENT Provider Note   CSN: [id number] Arrival date & time: 10/28/19  1207     History Chief Complaint  Patient presents with  . Weakness    Albert Hahn is a 74 y.o. male.  HPI Level 5 caveat due to altered mental status.  Sent in from home.  Reportedly just discharged from [facility] on Friday.  Reportedly had a urine done at that time.  However patient has been weak and unable to get up and around on his own.  Family is unable to lift him.  Found to have a temperature of 101.7 upon arrival.  Martin Majestic to the [facility] after Covid around 3 weeks ago.  Does have some superficial decubitus ulcers.  Nursing states patient was wet with foul-smelling urine.    Past Medical History:  Diagnosis Date  . Anxiety   . Arthritis   . Complication of anesthesia    woke up during lymph node removal, facial surgery  . Depression   . H/O emphysema   . Headache(784.0)   . History of hiatal hernia    "doesnt bother me"  . Hypertension   . 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 PTSD (post-traumatic stress disorder)     Patient Active Problem List   Diagnosis Date Noted  . Fever 10/11/2019  . Acute encephalopathy   . Transaminasemia   . Acute metabolic encephalopathy 43/15/4008  . Acute renal failure superimposed on stage 3a chronic kidney disease ([place]) 10/09/2019  . Acute respiratory disease due to COVID-19 virus 10/07/2019  . Acute hypokalemia 10/07/2019  . SIRS (systemic inflammatory response syndrome) ([place]) 10/07/2019  . COVID-19 virus infection 10/07/2019  . 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 Non Hodgkin's lymphoma ([place]) 12/29/2011    Past Surgical History:  Procedure Laterality Date  . BACK SURGERY     L3-L4 Fusion per pt  . COLONOSCOPY W/ POLYPECTOMY    . HERNIA REPAIR    . HIP SURGERY    . JOINT REPLACEMENT    . LUMBAR PUNCTURE  07/23/2012   severe headaches  . NECK SURGERY     metal plates and screws in neck   . TOTAL HIP ARTHROPLASTY      . VENTRICULOPERITONEAL SHUNT N/A 11/25/2016   Procedure: SHUNT INSERTION VENTRICULAR-PERITONEAL;  Surgeon: Eustace Moore, MD;  Location: [facility];  Service: Neurosurgery;  Laterality: N/A;       Family History  Problem Relation Age of Onset  . Healthy Mother   . Healthy Father     Social History   Tobacco Use  . Smoking status: Former Research scientist (life sciences)  . Smokeless tobacco: Never Used  . Tobacco comment: Stopped smoking 29 years ago   Substance Use Topics  . Alcohol use: No    Alcohol/week: 1.0 standard drinks    Types: 1 Glasses of wine per week  . Drug use: No    Home Medications Prior to Admission medications   Medication Sig Start Date End Date Taking? Authorizing Provider  allopurinol (ZYLOPRIM) 100 MG tablet Take 100 mg by mouth daily.   Yes [provider]  amitriptyline (ELAVIL) 50 MG tablet Take 1 tablet (50 mg total) by mouth at bedtime. Patient taking differently: Take 75 mg by mouth at bedtime.  10/13/19  Yes Tat, Shanon Brow, MD  amLODipine (NORVASC) 10 MG tablet Take 10 mg by mouth daily.   Yes [provider]  docusate sodium (COLACE) 100 MG capsule Take 500 mg by mouth  daily.    Yes [provider]  hydrochlorothiazide (HYDRODIURIL) 25 MG tablet Take 25 mg by mouth daily.   Yes [provider]  oxyCODONE (OXY IR/ROXICODONE) 5 MG immediate release tablet Take 1 tablet (5 mg total) by mouth every 6 (six) hours as needed (for pain.). 10/13/19  Yes Tat, Shanon Brow, MD  Polyethyl Glycol-Propyl Glycol (LUBRICANT EYE DROPS) 0.4-0.3 % SOLN Place 1-2 drops into both eyes daily.   Yes [provider]  temazepam (RESTORIL) 15 MG capsule Take 15 mg by mouth at bedtime as needed for sleep.   Yes [provider]  terazosin (HYTRIN) 2 MG capsule Take 4 mg by mouth at bedtime.   Yes [provider]  topiramate (TOPAMAX) 100 MG tablet Take 100 mg by mouth daily. At bedtime   Yes [provider]  acetaminophen (TYLENOL) 500 MG tablet  Take 500-1,000 mg by mouth every 6 (six) hours as needed for mild pain or moderate pain.    [provider]  dexamethasone (DECADRON) 6 MG tablet Take 1 tablet (6 mg total) by mouth daily. X 3 days Patient not taking: Reported on 10/28/2019 10/14/19   Orson Eva, MD    Allergies    Bee venom, Shellfish allergy, and Codeine  Review of Systems   Review of Systems  Unable to perform ROS: Mental status change    Physical Exam Updated Vital Signs BP 128/70   Pulse (!) 103   Temp (!) 101.7 F (38.7 C) (Rectal)   Resp (!) 24   Ht 5\' 7"  (1.702 m)   Wt 93 kg   SpO2 93%   BMI 32.11 kg/m   Physical Exam Vitals reviewed.  Constitutional:      Comments: Sitting in bed with eyes closed but will wake to conversation.  HENT:     Head: Atraumatic.  Eyes:     Extraocular Movements: Extraocular movements intact.  Cardiovascular:     Comments: Mild tachycardia Pulmonary:     Breath sounds: No wheezing or rhonchi.  Abdominal:     Comments: Mild upper abdominal tenderness without rebound or guarding.  May have mild distention.  Musculoskeletal:     Cervical back: Neck supple.     Comments: Right lower extremityexternally rotated in bed, however free painless range of motion.  Skin:    General: Skin is warm.  Neurological:     Comments: Sitting in bed with eyes closed, however will discuss somewhat with some mild confusion.     ED Results / Procedures / Treatments   Labs (all labs ordered are listed, but only abnormal results are displayed) Labs Reviewed  COMPREHENSIVE METABOLIC PANEL - Abnormal; Notable for the following components:      Result Value   Sodium 133 (*)    Potassium 3.0 (*)    CO2 20 (*)    Glucose, Bld 108 (*)    BUN 27 (*)    Creatinine, Ser 1.82 (*)    Calcium 8.5 (*)    Albumin 2.7 (*)    AST 55 (*)    ALT 61 (*)    GFR calc non Af Amer 36 (*)    GFR calc Af Amer 42 (*)    All other components within normal limits  CBC WITH DIFFERENTIAL/PLATELET  - Abnormal; Notable for the following components:   RBC 3.97 (*)    Hemoglobin 12.7 (*)    HCT 37.8 (*)    Neutro Abs 8.3 (*)    Abs Immature Granulocytes 0.13 (*)  All other components within normal limits  URINALYSIS, ROUTINE W REFLEX MICROSCOPIC - Abnormal; Notable for the following components:   Color, Urine AMBER (*)    APPearance HAZY (*)    Hgb urine dipstick MODERATE (*)    Protein, ur 100 (*)    Nitrite POSITIVE (*)    Leukocytes,Ua LARGE (*)    Bacteria, UA RARE (*)    All other components within normal limits  CULTURE, BLOOD (ROUTINE X 2)  CULTURE, BLOOD (ROUTINE X 2)  URINE CULTURE  LACTIC ACID, PLASMA  APTT  PROTIME-INR  LACTIC ACID, PLASMA    EKG EKG Interpretation  Date/Time:  Monday October 28 2019 12:32:42 EDT Ventricular Rate:  106 PR Interval:    QRS Duration: 107 QT Interval:  495 QTC Calculation: 658 R Axis:   18 Text Interpretation: Sinus tachycardia Nonspecific repol abnormality, diffuse leads Prolonged QT interval Confirmed by Davonna Belling [id number]) on 10/28/2019 2:17:26 PM   Radiology DG Abdomen Acute W/Chest  Result Date: 10/28/2019 CLINICAL DATA:  Fever and weakness EXAM: DG ABDOMEN ACUTE W/ 1V CHEST COMPARISON:  CT abdomen pelvis 04/26/2017 FINDINGS: Heart size and vascularity normal. Lungs are clear without infiltrate or effusion. VP shunt tubing in the right chest extending into the mid abdomen without discontinuity. Normal bowel gas pattern. No bowel obstruction ileus or free air. Left hip replacement. IMPRESSION: Negative abdominal radiographs.  No acute cardiopulmonary disease. Electronically Signed   By: Franchot Gallo M.D.   On: 10/28/2019 14:25    Procedures Procedures (including critical care time)  Medications Ordered in ED Medications  ceFEPIme (MAXIPIME) 2 g in sodium chloride 0.9 % 100 mL IVPB (0 g Intravenous Stopped 10/28/19 1330)  lactated ringers bolus 1,000 mL (0 mLs Intravenous Stopped 10/28/19 1330)    ED Course  I  have reviewed the triage vital signs and the nursing notes.  Pertinent labs & imaging results that were available during my care of the patient were reviewed by me and considered in my medical decision making (see chart for details).    MDM Rules/Calculators/A&P                      Patient presents with fever and weakness.  Has likely UTI with positive nitrates leukoesterase and white cells although only rare bacteria.  Temperature one 1.7.  X-ray reassuring.  Maintain blood pressure normal lactic acid.  Does not appear to be a septic shock.  Will admit to internal medicine.  Urine culture sent.  Blood culture sent  CRITICAL CARE Performed by: Davonna Belling Total critical care time: 30 minutes Critical care time was exclusive of separately billable procedures and treating other patients. Critical care was necessary to treat or prevent imminent or life-threatening deterioration. Critical care was time spent personally by me on the following activities: development of treatment plan with patient and/or surrogate as well as nursing, discussions with consultants, evaluation of patient's response to treatment, examination of patient, obtaining history from patient or surrogate, ordering and performing treatments and interventions, ordering and review of laboratory studies, ordering and review of radiographic studies, pulse oximetry and re-evaluation of patient's condition.  Final Clinical Impression(s) / ED Diagnoses Final diagnoses:  Fever, unspecified fever cause  Urinary tract infection without hematuria, site unspecified    Rx / DC Orders ED Discharge Orders    None       Davonna Belling, MD 10/28/19 1452

## 2019-10-28 NOTE — Progress Notes (Signed)
Spoke with pt wife who requested an update on care provided. Wife stated she will visit in the morning

## 2019-10-28 NOTE — ED Triage Notes (Signed)
PT brought in by [facility] from home for complaint of generalized weakness and foul smelling urine. PT rectal temp 101.7 upon arrival and lethargic. PT was discharged from [facility] in [place], [place] on Friday per family and has been laying on the couch at home and hasn't been able to help his family clean him up. Several small decubitus ulcers noted to buttocks, strong smelling urine with saturated brief and clothes upon arrival. PT was diagnosed with Covid on 10/07/2019 and went to [facility] after a brief hospital stay.

## 2019-10-28 NOTE — Progress Notes (Signed)
Pharmacy Antibiotic Note  Albert Hahn is a 74 y.o. male admitted on 10/28/2019 with UTI/possible sepsis.  Pharmacy has been consulted for cefepime dosing.  Plan: Cefepime 2gm IV q12h F/U cxs and clinical progress Monitor V/S, labs  Height: 5\' 7"  (170.2 cm) Weight: 205 lb 0.4 oz (93 kg) IBW/kg (Calculated) : 66.1  Temp (24hrs), Avg:101.7 F (38.7 C), Min:101.7 F (38.7 C), Max:101.7 F (38.7 C)  Recent Labs  Lab 10/22/19 1514 10/28/19 1258 10/28/19 1453  WBC 10.9* 10.2  --   CREATININE 1.48* 1.82*  --   LATICACIDVEN  --  1.3 1.5    Estimated Creatinine Clearance: 39.9 mL/min (A) (by C-G formula based on SCr of 1.82 mg/dL (H)).    Allergies  Allergen Reactions  . Bee Venom Anaphylaxis  . Shellfish Allergy Anaphylaxis  . Codeine Nausea And Vomiting    Antimicrobials this admission: Cefepime 3/15>>  Dose adjustments this admission: prn  Microbiology results: 3/15 BCx: pending 3/15 UCx: pending  Thank you for allowing pharmacy to be a part of this patient's care.  Isac Sarna, BS [place], [place] Clinical Pharmacist Pager [phone] 10/28/2019 5:29 PM

## 2019-10-29 ENCOUNTER — Inpatient Hospital Stay (HOSPITAL_COMMUNITY): Payer: No Typology Code available for payment source

## 2019-10-29 ENCOUNTER — Encounter (HOSPITAL_COMMUNITY): Payer: Self-pay | Admitting: Internal Medicine

## 2019-10-29 DIAGNOSIS — N1831 Chronic kidney disease, stage 3a: Secondary | ICD-10-CM

## 2019-10-29 DIAGNOSIS — N179 Acute kidney failure, unspecified: Secondary | ICD-10-CM

## 2019-10-29 DIAGNOSIS — Z7189 Other specified counseling: Secondary | ICD-10-CM

## 2019-10-29 DIAGNOSIS — A419 Sepsis, unspecified organism: Secondary | ICD-10-CM

## 2019-10-29 DIAGNOSIS — Z515 Encounter for palliative care: Secondary | ICD-10-CM

## 2019-10-29 DIAGNOSIS — G9341 Metabolic encephalopathy: Secondary | ICD-10-CM

## 2019-10-29 DIAGNOSIS — N390 Urinary tract infection, site not specified: Secondary | ICD-10-CM

## 2019-10-29 LAB — BASIC METABOLIC PANEL
Anion gap: 12 (ref 5–15)
BUN: 26 mg/dL — ABNORMAL HIGH (ref 8–23)
CO2: 21 mmol/L — ABNORMAL LOW (ref 22–32)
Calcium: 8.4 mg/dL — ABNORMAL LOW (ref 8.9–10.3)
Chloride: 102 mmol/L (ref 98–111)
Creatinine, Ser: 1.69 mg/dL — ABNORMAL HIGH (ref 0.61–1.24)
GFR calc Af Amer: 46 mL/min — ABNORMAL LOW (ref >60)
GFR calc non Af Amer: 40 mL/min — ABNORMAL LOW (ref >60)
Glucose, Bld: 104 mg/dL — ABNORMAL HIGH (ref 70–99)
Potassium: 3 mmol/L — ABNORMAL LOW (ref 3.5–5.1)
Sodium: 135 mmol/L (ref 135–145)

## 2019-10-29 LAB — CBC
HCT: 34.6 % — ABNORMAL LOW (ref 39.0–52.0)
Hemoglobin: 11.3 g/dL — ABNORMAL LOW (ref 13.0–17.0)
MCH: 31.4 pg (ref 26.0–34.0)
MCHC: 32.7 g/dL (ref 30.0–36.0)
MCV: 96.1 fL (ref 80.0–100.0)
Platelets: 213 10*3/uL (ref 150–400)
RBC: 3.6 MIL/uL — ABNORMAL LOW (ref 4.22–5.81)
RDW: 13.6 % (ref 11.5–15.5)
WBC: 10.5 10*3/uL (ref 4.0–10.5)
nRBC: 0 % (ref 0.0–0.2)

## 2019-10-29 LAB — GLUCOSE, CAPILLARY
Glucose-Capillary: 92 mg/dL (ref 70–99)
Glucose-Capillary: 125 mg/dL — ABNORMAL HIGH (ref 70–99)
Glucose-Capillary: 122 mg/dL — ABNORMAL HIGH (ref 70–99)

## 2019-10-29 LAB — MRSA PCR SCREENING: MRSA by PCR: NEGATIVE

## 2019-10-29 LAB — URINE CULTURE

## 2019-10-29 LAB — MAGNESIUM: Magnesium: 1.9 mg/dL (ref 1.7–2.4)

## 2019-10-29 MED ORDER — POTASSIUM CHLORIDE 20 MEQ/15ML (10%) PO SOLN
20.0000 meq | Freq: Every day | ORAL | Status: DC
  Administered 2019-10-29: 20 meq via ORAL
  Filled 2019-10-29: qty 30

## 2019-10-29 MED ORDER — POTASSIUM CHLORIDE IN NACL 20-0.9 MEQ/L-% IV SOLN: INTRAVENOUS | Status: DC

## 2019-10-29 MED ORDER — DOCUSATE SODIUM 50 MG/5ML PO LIQD
500.0000 mg | Freq: Every day | ORAL | Status: DC
  Administered 2019-10-29: 500 mg via ORAL
  Filled 2019-10-29 (×4): qty 50

## 2019-10-29 NOTE — Evaluation (Signed)
Physical Therapy Evaluation Patient Details Name: Albert Hahn MRN: [id number] DOB: September 13, 1945 Today's Date: 10/29/2019   History of Present Illness  Albert Hahn is a 74 y.o. male with medical history significant of recent hospitalizaton for covid 19 PNA. He was discharged to [facility] 2/2 weakness and assist with ADLs. He was discharged home Friday 10/25/19 in good condition, awake and alert. Starting Saturday he became weak and family had a difficult time providing assistance. He developed increased weakness and foul smelling urine. EMS was called and brought patient to [facility] for evaluation. Per wife when EMT's tried to move him he cried out with pain in his legs.    Clinical Impression  Patient limited for functional mobility as stated below secondary to BLE weakness, fatigue and poor sitting balance. Patient lethargic at beginning of session and appears to be a poor historian of home set up and PLOF. Patient requires verbal and tactile cueing for sequencing with bed mobility with limited carry over. He requires mod/max assist to transition to seated EOB and demonstrates poor sitting balance requiring assist to remain seated. He is apprehensive of transitioning to stand today and requested return to bed secondary to pain and fatigue. Patient returned to bed - RN aware.  Patient will benefit from continued physical therapy in hospital and recommended venue below to increase strength, balance, endurance for safe ADLs and gait.     Follow Up Recommendations SNF    Equipment Recommendations  None recommended by PT    Recommendations for Other Services       Precautions / Restrictions Precautions Precautions: Fall Restrictions Weight Bearing Restrictions: No      Mobility  Bed Mobility Overal bed mobility: Needs Assistance Bed Mobility: Supine to Sit;Rolling Rolling: Mod assist;Max assist   Supine to sit: Mod assist;Max assist;HOB elevated     General bed mobility  comments: slow labored movement, frequent verbal and tactile cueing for sequencing with limited carry over, mod/max assist to transition to seated EOB and assist to remain seated  Transfers                    Ambulation/Gait                Stairs            Wheelchair Mobility    Modified Rankin (Stroke Patients Only)       Balance Overall balance assessment: Needs assistance Sitting-balance support: Feet supported;Single extremity supported Sitting balance-Leahy Scale: Poor Sitting balance - Comments: seated EOB                                     Pertinent Vitals/Pain Pain Assessment: Faces Faces Pain Scale: Hurts even more Pain Location: buttocks Pain Intervention(s): Limited activity within patient's tolerance;Monitored during session    Home Living Family/patient expects to be discharged to:: Private residence Living Arrangements: Spouse/significant other Available Help at Discharge: Family;Available 24 hours/day Type of Home: House Home Access: Stairs to enter Entrance Stairs-Rails: Right Entrance Stairs-Number of Steps: 4 Home Layout: One level Home Equipment: Walker - 2 wheels;Cane - single point;Shower seat;Bedside commode;Wheelchair - manual;Hospital bed Additional Comments: Patient appears to be a poor historian of home set up and PLOF    Prior Function Level of Independence: Needs assistance   Gait / Transfers Assistance Needed: patient states uses RW  ADL's / Homemaking Assistance Needed: family assists  Comments: Patient  appears to be a poor historian of home set up and PLOF     Hand Dominance   Dominant Hand: Right    Extremity/Trunk Assessment   Upper Extremity Assessment Upper Extremity Assessment: Defer to OT evaluation    Lower Extremity Assessment Lower Extremity Assessment: Generalized weakness    Cervical / Trunk Assessment Cervical / Trunk Assessment: Normal  Communication   Communication: No  difficulties  Cognition Arousal/Alertness: Awake/alert;Lethargic Behavior During Therapy: WFL for tasks assessed/performed;Flat affect Overall Cognitive Status: No family/caregiver present to determine baseline cognitive functioning                                 General Comments: requires repeated verbal/tactile cueing to complete functional tasks      General Comments      Exercises     Assessment/Plan    PT Assessment Patient needs continued PT services  PT Problem List Decreased strength;Decreased activity tolerance;Decreased mobility;Decreased balance;Pain       PT Treatment Interventions Balance training;Gait training;Stair training;Functional mobility training;Therapeutic activities;Therapeutic exercise;Patient/family education;DME instruction;Neuromuscular re-education    PT Goals (Current goals can be found in the Care Plan section)  Acute Rehab PT Goals Patient Stated Goal: return home PT Goal Formulation: With patient Time For Goal Achievement: 11/12/19 Potential to Achieve Goals: Fair    Frequency Min 3X/week   Barriers to discharge        Co-evaluation PT/OT/SLP Co-Evaluation/Treatment: Yes Reason for Co-Treatment: For patient/therapist safety;To address functional/ADL transfers PT goals addressed during session: Mobility/safety with mobility         AM-PAC PT "6 Clicks" Mobility  Outcome Measure Help needed turning from your back to your side while in a flat bed without using bedrails?: A Lot Help needed moving from lying on your back to sitting on the side of a flat bed without using bedrails?: A Lot Help needed moving to and from a bed to a chair (including a wheelchair)?: A Lot Help needed standing up from a chair using your arms (e.g., wheelchair or bedside chair)?: A Lot Help needed to walk in hospital room?: A Lot Help needed climbing 3-5 steps with a railing? : Total 6 Click Score: 11    End of Session Equipment Utilized  During Treatment: Gait belt Activity Tolerance: Patient tolerated treatment well;Patient limited by fatigue;Patient limited by pain Patient left: in bed;with call bell/phone within reach;with bed alarm set Nurse Communication: Mobility status PT Visit Diagnosis: Unsteadiness on feet (R26.81);Other abnormalities of gait and mobility (R26.89);Muscle weakness (generalized) (M62.81)    Time: 6301-6010 PT Time Calculation (min) (ACUTE ONLY): 20 min   Charges:   PT Evaluation $PT Eval Moderate Complexity: 1 Mod          8:30 AM, 10/29/19 Mearl Latin PT, DPT Physical Therapist at [facility]

## 2019-10-29 NOTE — Progress Notes (Signed)
FL2 completed and referrals sent for SNF placement.  Tobi Bastos, LCSW Transitions of Care Clinical Social Worker [facility] Ph: [phone]

## 2019-10-29 NOTE — Progress Notes (Addendum)
PROGRESS NOTE  Albert Hahn ZOX:[id number] DOB: [birth year] DOA: 10/28/2019 PCP: Asencion Noble, MD  Brief History:  73 y.o.malewith past medical history relevant for history ofNHL,VP shunt for NPH, PTSD, HTN, with recent COVID-19 infection for which he was hospitalized 10/07/19-10/13/19.  He was discharged to [facility] on 10/13/19.  He was doing well and discharged home on 10/25/19.  Over the next 24-48 hours, family noted pt had gradual worsening of generalized weakness and confusion.  There was concern for UTI.  He was taken to UC on 10/26/19 where apparently a UA was obtained, but the results were unknown.  Nevertheless, the patient had worsening generalized weakness requiring greater amount of assistance with transfers and increasing confusion.  As result, the patient was brought to emergency department for the evaluation.  There is no reports of chest pain, shortness breath, nausea, vomiting, diarrhea, syncope. In the emergency department, the patient had a temperature up to 101.7 F with tachycardia in the 110s.  He was hemodynamically stable with oxygen saturation 98% on room air.  UA showed 21-50 WBC.  Labs showed potassium 3.0, sodium 133, serum creatinine 122.  WBC was 10.2 with hemoglobin 12.7 and platelets 237,000.  The patient was admitted for sepsis secondary to UTI.  He was started on ceftriaxone.  Assessment/Plan: Sepsis -Present at the time of admission -Secondary to UTI -Patient presented with fever up to 101.7 F with tachycardia -Lactic acid peaked 1.5 -continue IVF  UTI -UA 21-50 WBC -Continue ceftriaxone pending urine culture data  Acute on chronic renal failure--CKD 3a -baseline creatinine 1.3-1.4 -serum creatinine peaked 1.82 -due to volume depletion and colchicine -continue IVF-->improving  Acute metabolic Encephalopathy -pt has underlying cognitive impairment -worsen due to infectious process and renal failure and hospital  delirium -TSH--1.637 -B12-379 -folate--15.1 -TSH--1.637 -remains pleasantly confused -MR brain cannot be obtained due to shunt incompatibility -DC restoril  COVID-19 Infection--recovered -no hypoxia  Cognitive Impairment -07/25/19 note from [place] states--longstanding left-sided weakness, with associated gait difficulty with 9 falls over the last few months -They also noted hx of irrational thoughts and cognitive changes -need formal neuropsychiatric eval in future once stable  HTN -holding amlodipine due to soft BPs  Hypokalemia -replete -mag 1.9  Anxiety/Depression/PTSD -continue alprazolam, topiramate, Elavil at hs    Disposition Plan: Patient From: Home/SNF D/C Place: Home/SNF - 2-3  Days Barriers: Not Clinically Stable--  Family Communication:   Family at bedside  Consultants:    Code Status:  FULL / DNR  DVT Prophylaxis:  [place] Heparin / [place] Lovenox   Procedures: As Listed in Progress Note Above  Antibiotics: None       Subjective: Patient denies fevers, chills, headache, chest pain, dyspnea, nausea, vomiting, diarrhea, abdominal pain, dysuria, hematuria, hematochezia, and melena.   Objective: Vitals:   10/28/19 1951 10/28/19 2028 10/28/19 2352 10/29/19 0615  BP:  (!) 158/136 116/79 113/79  Pulse:  92 (!) 102 (!) 104  Resp:  20 (!) 24 20  Temp: (!) 102.2 F (39 C) 99.9 F (37.7 C) 97.8 F (36.6 C) 97.6 F (36.4 C)  TempSrc: Oral Oral Oral Oral  SpO2:   96% 98%  Weight:      Height:        Intake/Output Summary (Last 24 hours) at 10/29/2019 0923 Last data filed at 10/29/2019 0641 Gross per 24 hour  Intake 1667.64 ml  Output 275 ml  Net 1392.64 ml   Weight change:  Exam:   General:  Pt is alert, follows commands appropriately, not in acute distress  HEENT: No icterus, No thrush, No neck mass, [place]/AT  Cardiovascular: RRR, S1/S2, no rubs, no gallops  Respiratory: bibasilar crackles. No wheeze  Abdomen: Soft/+BS, non tender,  non distended, no guarding  Extremities: No edema, No lymphangitis, No petechiae, No rashes, no synovitis+DP pulses bilateral   Data Reviewed: I have personally reviewed following labs and imaging studies Basic Metabolic Panel: Recent Labs  Lab 10/22/19 1514 10/28/19 1258 10/29/19 0454  NA 133* 133* 135  K 4.4 3.0* 3.0*  CL 104 100 102  CO2 19* 20* 21*  GLUCOSE 88 108* 104*  BUN 24* 27* 26*  CREATININE 1.48* 1.82* 1.69*  CALCIUM 9.0 8.5* 8.4*  MG  --   --  1.9   Liver Function Tests: Recent Labs  Lab 10/28/19 1258  AST 55*  ALT 61*  ALKPHOS 126  BILITOT 0.5  PROT 7.3  ALBUMIN 2.7*   No results for input(s): LIPASE, AMYLASE in the last 168 hours. No results for input(s): AMMONIA in the last 168 hours. Coagulation Profile: Recent Labs  Lab 10/28/19 1258  INR 1.2   CBC: Recent Labs  Lab 10/22/19 1514 10/28/19 1258 10/29/19 0454  WBC 10.9* 10.2 10.5  NEUTROABS  --  8.3*  --   HGB 13.4 12.7* 11.3*  HCT 41.0 37.8* 34.6*  MCV 98.1 95.2 96.1  PLT 248 237 213   Cardiac Enzymes: No results for input(s): CKTOTAL, CKMB, CKMBINDEX, TROPONINI in the last 168 hours. BNP: Invalid input(s): POCBNP CBG: Recent Labs  Lab 10/29/19 0744  GLUCAP 92   HbA1C: No results for input(s): HGBA1C in the last 72 hours. Urine analysis:    Component Value Date/Time   COLORURINE AMBER (A) 10/28/2019 1242   APPEARANCEUR HAZY (A) 10/28/2019 1242   LABSPEC 1.013 10/28/2019 1242   LABSPEC 1.025 09/06/2006 1104   PHURINE 7.0 10/28/2019 1242   GLUCOSEU NEGATIVE 10/28/2019 1242   HGBUR MODERATE (A) 10/28/2019 1242   BILIRUBINUR NEGATIVE 10/28/2019 1242   BILIRUBINUR Negative 09/06/2006 1104   KETONESUR NEGATIVE 10/28/2019 1242   PROTEINUR 100 (A) 10/28/2019 1242   UROBILINOGEN 1.0 08/20/2008 1340   NITRITE POSITIVE (A) 10/28/2019 1242   LEUKOCYTESUR LARGE (A) 10/28/2019 1242   LEUKOCYTESUR Trace 09/06/2006 1104   Sepsis  Labs: @LABRCNTIP (procalcitonin:4,lacticidven:4) ) Recent Results (from the past 240 hour(s))  Blood Culture (routine x 2)     Status: None (Preliminary result)   Collection Time: 10/28/19 12:58 PM   Specimen: Right Antecubital; Blood  Result Value Ref Range Status   Specimen Description   Final    RIGHT ANTECUBITAL BOTTLES DRAWN AEROBIC AND ANAEROBIC   Special Requests Blood Culture adequate volume  Final   Culture   Final    NO GROWTH < 24 HOURS Performed at [facility], [address] [place], [place] [address]    Report Status PENDING  Incomplete  Blood Culture (routine x 2)     Status: None (Preliminary result)   Collection Time: 10/28/19  1:12 PM   Specimen: Left Antecubital; Blood  Result Value Ref Range Status   Specimen Description   Final    LEFT ANTECUBITAL BOTTLES DRAWN AEROBIC AND ANAEROBIC   Special Requests Blood Culture adequate volume  Final   Culture   Final    NO GROWTH < 24 HOURS Performed at [facility], [address], [place],  [address]    Report Status PENDING  Incomplete  MRSA PCR Screening  Status: None   Collection Time: 10/29/19 12:41 AM   Specimen: Nasopharyngeal  Result Value Ref Range Status   MRSA by PCR NEGATIVE NEGATIVE Final    Comment:        The GeneXpert MRSA Assay (FDA approved for NASAL specimens only), is one component of a comprehensive MRSA colonization surveillance program. It is not intended to diagnose MRSA infection nor to guide or monitor treatment for MRSA infections. Performed at [facility], [address], [place],  [address]      Scheduled Meds: . allopurinol  100 mg Oral Daily  . amitriptyline  75 mg Oral QHS  . docusate sodium  500 mg Oral Daily  . enoxaparin (LOVENOX) injection  40 mg Subcutaneous Q24H  . polyvinyl alcohol  1 drop Both Eyes Daily  . potassium chloride  20 mEq Oral Daily  . potassium chloride  40 mEq Oral Once  . terazosin  4 mg Oral QHS  . topiramate  100 mg Oral QHS    Continuous Infusions: . cefTRIAXone (ROCEPHIN)  IV 1 g (10/28/19 2355)    Procedures/Studies: CT ABDOMEN PELVIS WO CONTRAST  Result Date: 10/11/2019 CLINICAL DATA:  Cough, dyspnea. COVID-19 positive. Abdominal distension. Remote history of lymphoma. EXAM: CT CHEST, ABDOMEN AND PELVIS WITHOUT CONTRAST TECHNIQUE: Multidetector CT imaging of the chest, abdomen and pelvis was performed following the standard protocol without IV contrast. COMPARISON:  CT AP 04/26/2017 in CT chest 12/22/2011. FINDINGS: CT CHEST FINDINGS Cardiovascular: The heart size is normal. Aortic atherosclerosis. Lad, left circumflex and RCA coronary artery calcifications. Mediastinum/Nodes: Normal appearance of the thyroid gland. The trachea appears patent and is midline. No enlarged mediastinal or hilar lymph nodes. No axillary or supraclavicular adenopathy. Lungs/Pleura: Exam detail is diminished due to motion artifact. No pleural effusions. Patchy area of ground-glass attenuation is identified within the lateral left lower lobe, image 92/3. Mild bilateral peripheral areas of ground-glass attenuation overlying the right upper lobe posterior lower lobes and subpleural medial left upper lobe noted. No airspace consolidation, atelectasis or pneumothorax. Musculoskeletal: No chest wall mass or suspicious bone lesions identified. CT ABDOMEN PELVIS FINDINGS Hepatobiliary: No focal liver abnormality is seen. No gallstones, gallbladder wall thickening, or biliary dilatation. Pancreas: Unremarkable. No pancreatic ductal dilatation or surrounding inflammatory changes. Spleen: Normal in size without focal abnormality. Adrenals/Urinary Tract: Normal adrenal glands. No kidney mass or hydronephrosis. Urinary bladder normal. Stomach/Bowel: Small hiatal hernia. The stomach is nondistended. There are no dilated loops of small or large bowel. No bowel wall thickening, inflammation or distension. The appendix is not confidently identified.  Vascular/Lymphatic: Aortic atherosclerosis. No aneurysm. No abdominopelvic adenopathy. Reproductive: Prostate is unremarkable. Other: Fat containing left inguinal hernia noted. Right-sided ventriculoperitoneal shunt tube is identified which terminates in the upper abdomen. Musculoskeletal: Sclerotic appearing L3 vertebral body is again noted with changes compatible with previous kyphoplasty. IMPRESSION: 1. Bilateral peripheral predominant areas of ground-glass attenuation involving the upper and lower lung zones compatible with atypical pneumonia. No lobar consolidation, atelectasis or pneumothorax. 2. No acute findings within the abdomen or pelvis 3. Small hiatal hernia 4. Coronary artery calcifications Aortic Atherosclerosis (ICD10-I70.0). Electronically Signed   By: Kerby Moors M.D.   On: 10/11/2019 14:26   CT HEAD WO CONTRAST  Result Date: 10/10/2019 CLINICAL DATA:  Encephalopathy EXAM: CT HEAD WITHOUT CONTRAST TECHNIQUE: Contiguous axial images were obtained from the base of the skull through the vertex without intravenous contrast. COMPARISON:  10/07/2019 FINDINGS: Brain: Right frontal approach shunt catheter is stable in position. Discontinued right parietal approach  shunt catheter again noted. Ventricle caliber is stable. There is no acute intracranial hemorrhage or mass effect. There is no new loss of gray-white differentiation. Nonspecific primarily periventricular white matter hypoattenuation is unchanged. Vascular: No hyperdense vessel. Skull: Calvarium is unremarkable apart from burr holes. Sinuses/Orbits: No new finding. Chronic sphenoid sinusitis is again noted. Other: None. IMPRESSION: No acute abnormality. Stable appearance compared to 10/07/2019 examination. Electronically Signed   By: Macy Mis M.D.   On: 10/10/2019 17:12   CT Head Wo Contrast  Result Date: 10/07/2019 CLINICAL DATA:  Encephalopathy, VP shunt EXAM: CT HEAD WITHOUT CONTRAST TECHNIQUE: Contiguous axial images were  obtained from the base of the skull through the vertex without intravenous contrast. COMPARISON:  01/06/2017 FINDINGS: Brain: Right frontal approach shunt catheter is new from the prior study and terminates near midline within the interhemispheric fissure. Right parietal approach catheter has been discontinued. Ventricle caliber is similar. There is no acute intracranial hemorrhage or mass effect. No new loss of gray differentiation. Nonspecific primarily periventricular white matter hypoattenuation. Vascular: No hyperdense vessel. Skull: Unremarkable apart from burr holes. Sinuses/Orbits: Patchy mucosal thickening with chronic sphenoid sinusitis. Other: Mastoid air cells are clear. IMPRESSION: Interval placement of right frontal approach shunt catheter. Similar enlargement of the third and lateral ventricles. No acute intracranial hemorrhage, mass effect, or evidence of acute infarction. Electronically Signed   By: Macy Mis M.D.   On: 10/07/2019 11:16   CT CHEST WO CONTRAST  Result Date: 10/11/2019 CLINICAL DATA:  Cough, dyspnea. COVID-19 positive. Abdominal distension. Remote history of lymphoma. EXAM: CT CHEST, ABDOMEN AND PELVIS WITHOUT CONTRAST TECHNIQUE: Multidetector CT imaging of the chest, abdomen and pelvis was performed following the standard protocol without IV contrast. COMPARISON:  CT AP 04/26/2017 in CT chest 12/22/2011. FINDINGS: CT CHEST FINDINGS Cardiovascular: The heart size is normal. Aortic atherosclerosis. Lad, left circumflex and RCA coronary artery calcifications. Mediastinum/Nodes: Normal appearance of the thyroid gland. The trachea appears patent and is midline. No enlarged mediastinal or hilar lymph nodes. No axillary or supraclavicular adenopathy. Lungs/Pleura: Exam detail is diminished due to motion artifact. No pleural effusions. Patchy area of ground-glass attenuation is identified within the lateral left lower lobe, image 92/3. Mild bilateral peripheral areas of ground-glass  attenuation overlying the right upper lobe posterior lower lobes and subpleural medial left upper lobe noted. No airspace consolidation, atelectasis or pneumothorax. Musculoskeletal: No chest wall mass or suspicious bone lesions identified. CT ABDOMEN PELVIS FINDINGS Hepatobiliary: No focal liver abnormality is seen. No gallstones, gallbladder wall thickening, or biliary dilatation. Pancreas: Unremarkable. No pancreatic ductal dilatation or surrounding inflammatory changes. Spleen: Normal in size without focal abnormality. Adrenals/Urinary Tract: Normal adrenal glands. No kidney mass or hydronephrosis. Urinary bladder normal. Stomach/Bowel: Small hiatal hernia. The stomach is nondistended. There are no dilated loops of small or large bowel. No bowel wall thickening, inflammation or distension. The appendix is not confidently identified. Vascular/Lymphatic: Aortic atherosclerosis. No aneurysm. No abdominopelvic adenopathy. Reproductive: Prostate is unremarkable. Other: Fat containing left inguinal hernia noted. Right-sided ventriculoperitoneal shunt tube is identified which terminates in the upper abdomen. Musculoskeletal: Sclerotic appearing L3 vertebral body is again noted with changes compatible with previous kyphoplasty. IMPRESSION: 1. Bilateral peripheral predominant areas of ground-glass attenuation involving the upper and lower lung zones compatible with atypical pneumonia. No lobar consolidation, atelectasis or pneumothorax. 2. No acute findings within the abdomen or pelvis 3. Small hiatal hernia 4. Coronary artery calcifications Aortic Atherosclerosis (ICD10-I70.0). Electronically Signed   By: Kerby Moors M.D.   On: 10/11/2019 14:26  CT Angio Chest PE W and/or Wo Contrast  Result Date: 10/22/2019 CLINICAL DATA:  PE suspected, high prob Chest pain. Diagnosed with COVID 2 weeks ago. EXAM: CT ANGIOGRAPHY CHEST WITH CONTRAST TECHNIQUE: Multidetector CT imaging of the chest was performed using the standard  protocol during bolus administration of intravenous contrast. Multiplanar CT image reconstructions and MIPs were obtained to evaluate the vascular anatomy. CONTRAST:  119mL OMNIPAQUE IOHEXOL 350 MG/ML SOLN COMPARISON:  Chest CT 10/11/2019, radiograph earlier this day. FINDINGS: Cardiovascular: Motion limited evaluation for pulmonary embolus. Apices and basilar evaluation is particularly limited. Allowing for limitations, no discrete filling defects in the pulmonary arteries to suggest pulmonary embolus. Aortic atherosclerosis without aortic dissection. 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 Mild cardiomegaly with coronary artery calcifications. Mediastinum/Nodes: Small mediastinal lymph nodes, all subcentimeter, likely reactive. No enlarged hilar lymph nodes. No esophageal wall thickening. Tiny hiatal hernia. No thyroid nodule. Lungs/Pleura: Breathing motion artifact limits assessment. Persistent but improved peripheral ground-glass opacities involving the right greater than left lung. Mild dependent atelectasis with mild pleural thickening, no discrete pleural effusion. Upper Abdomen: Calcified hepatic granuloma. No acute upper abdominal findings. Musculoskeletal: There are no acute or suspicious osseous abnormalities. Degenerative change in the spine. Postsurgical change in the lower cervical spine, partially included. Intra-articular bodies posterior to the right shoulder. Review of the MIP images confirms the above findings. IMPRESSION: 1. Motion limited exam. Allowing for motion limitations, no filling defects in the pulmonary arteries to suggest pulmonary embolus. 2. Persistent but improved peripheral ground-glass opacities in the right greater than left lung. Findings consistent with sequela of COVID pneumonia. Mild dependent atelectasis. 3. Cardiomegaly with coronary artery calcifications. 4. Aortic tortuosity with use warm aneurysmal dilatation of the ascending aorta, maximal  dimension 4.2 cm. Recommend annual imaging followup by CTA or MRA. This recommendation follows 2010 ACCF/AHA/AATS/ACR/ASA/SCA/SCAI/SIR/STS/SVM Guidelines for the Diagnosis and Management of Patients with Thoracic Aortic Disease. Circulation. 2010; 121: H474-Q595. Aortic aneurysm NOS (ICD10-I71.9) Aortic Atherosclerosis (ICD10-I70.0). Electronically Signed   By: Keith Rake M.D.   On: 10/22/2019 20:17   US Venous Img Lower Bilateral (DVT)  Result Date: 10/08/2019 CLINICAL DATA:  Concern for pulmonary embolism. Bilateral lower extremity edema. EXAM: BILATERAL LOWER EXTREMITY VENOUS DOPPLER ULTRASOUND TECHNIQUE: Gray-scale sonography with graded compression, as well as color Doppler and duplex ultrasound were performed to evaluate the lower extremity deep venous systems from the level of the common femoral vein and including the common femoral, femoral, profunda femoral, popliteal and calf veins including the posterior tibial, peroneal and gastrocnemius veins when visible. The superficial great saphenous vein was also interrogated. Spectral Doppler was utilized to evaluate flow at rest and with distal augmentation maneuvers in the common femoral, femoral and popliteal veins. COMPARISON:  None. FINDINGS: RIGHT LOWER EXTREMITY Common Femoral Vein: No evidence of thrombus. Normal compressibility, respiratory phasicity and response to augmentation. Saphenofemoral Junction: No evidence of thrombus. Normal compressibility and flow on color Doppler imaging. Profunda Femoral Vein: No evidence of thrombus. Normal compressibility and flow on color Doppler imaging. Femoral Vein: No evidence of thrombus. Normal compressibility, respiratory phasicity and response to augmentation. Popliteal Vein: No evidence of thrombus. Normal compressibility, respiratory phasicity and response to augmentation. Calf Veins: No evidence of thrombus. Normal compressibility and flow on color Doppler imaging. Superficial Great Saphenous Vein:  No evidence of thrombus. Normal compressibility. Venous Reflux:  None. Other Findings: No evidence of superficial thrombophlebitis or abnormal fluid collection. LEFT LOWER EXTREMITY Common Femoral Vein: No evidence of thrombus. Normal compressibility,  respiratory phasicity and response to augmentation. Saphenofemoral Junction: No evidence of thrombus. Normal compressibility and flow on color Doppler imaging. Profunda Femoral Vein: No evidence of thrombus. Normal compressibility and flow on color Doppler imaging. Femoral Vein: No evidence of thrombus. Normal compressibility, respiratory phasicity and response to augmentation. Popliteal Vein: No evidence of thrombus. Normal compressibility, respiratory phasicity and response to augmentation. Calf Veins: No evidence of thrombus. Normal compressibility and flow on color Doppler imaging. Superficial Great Saphenous Vein: No evidence of thrombus. Normal compressibility. Venous Reflux:  None. Other Findings: No evidence of superficial thrombophlebitis or abnormal fluid collection. IMPRESSION: No evidence of deep venous thrombosis in either lower extremity. Electronically Signed   By: Aletta Edouard M.D.   On: 10/08/2019 10:27   DG Chest Portable 1 View  Result Date: 10/22/2019 CLINICAL DATA:  Chest pain relieved with nitroglycerin EXAM: PORTABLE CHEST 1 VIEW COMPARISON:  10/08/2019, 10/11/2019 FINDINGS: Single frontal view of the chest demonstrates stable enlargement of the cardiac silhouette. Continued ectasia of the thoracic aorta. Ventriculostomy catheter overlies right chest. No airspace disease, effusion, or pneumothorax. No acute bony abnormalities. IMPRESSION: 1. No acute intrathoracic process. Electronically Signed   By: Randa Ngo M.D.   On: 10/22/2019 16:25   DG CHEST PORT 1 VIEW  Result Date: 10/08/2019 CLINICAL DATA:  Dyspnea, COVID positive EXAM: PORTABLE CHEST 1 VIEW COMPARISON:  Chest radiograph from one day prior. FINDINGS: Intact appearing  visualized right sided VP shunt. Partially visualized surgical fixation hardware in the lower cervical spine. Stable cardiomediastinal silhouette with top-normal heart size. No pneumothorax. No pleural effusion. Lungs appear clear, with no acute consolidative airspace disease and no pulmonary edema. IMPRESSION: No active disease. Electronically Signed   By: Ilona Sorrel M.D.   On: 10/08/2019 10:48   DG Chest Port 1 View  Result Date: 10/07/2019 CLINICAL DATA:  EMS reports wife called because pt unable to get up off of couch. Reports when they arrived pt was soaked in urine. Reports recently exposed to someone with covid. Reports cough for " a while." EMS reports fever 101.8. EXAM: PORTABLE CHEST - 1 VIEW COMPARISON:  07/29/2019 FINDINGS: Lungs are clear. Heart size upper limits normal for technique. Tortuous thoracic aorta. No effusion. No pneumothorax. Stable cervical fixation hardware. VP shunt tubing projects over the right hemithorax. IMPRESSION: No acute disease. Electronically Signed   By: Lucrezia Europe M.D.   On: 10/07/2019 10:34   DG Abdomen Acute W/Chest  Result Date: 10/28/2019 CLINICAL DATA:  Fever and weakness EXAM: DG ABDOMEN ACUTE W/ 1V CHEST COMPARISON:  CT abdomen pelvis 04/26/2017 FINDINGS: Heart size and vascularity normal. Lungs are clear without infiltrate or effusion. VP shunt tubing in the right chest extending into the mid abdomen without discontinuity. Normal bowel gas pattern. No bowel obstruction ileus or free air. Left hip replacement. IMPRESSION: Negative abdominal radiographs.  No acute cardiopulmonary disease. Electronically Signed   By: Franchot Gallo M.D.   On: 10/28/2019 14:25   DG FLUORO GUIDED NEEDLE PLC ASPIRATION/INJECTION LOC  Result Date: 10/11/2019 CLINICAL DATA:  Fever, encephalopathy EXAM: DIAGNOSTIC LUMBAR PUNCTURE UNDER FLUOROSCOPIC GUIDANCE FLUOROSCOPY TIME:  Fluoroscopy Time:  0 minutes 6 seconds Radiation Exposure Index (if provided by the fluoroscopic device):  15.2 mGy Number of Acquired Spot Images: 1 PROCEDURE: Procedure, benefits, and risks were discussed with the patient, including alternatives. Patient's questions were answered. Written informed consent was obtained. Timeout protocol followed. Patient placed prone. L3-L4 disc space was localized under fluoroscopy. Skin prepped and draped in usual sterile fashion.  Skin and soft tissues anesthetized with 3 mL of 1% lidocaine. 22 gauge needle was advanced into spinal canal where clear colorless CSF was encountered. 9 mL of CSF was obtained in 4 tubes for requested analysis. Procedure tolerated very well by patient without immediate complication. Opening pressure was not recorded due to patient limitations in cooperating for study. IMPRESSION: Successful fluoroscopically guided lumbar puncture as above. Electronically Signed   By: Lavonia Dana M.D.   On: 10/11/2019 14:27   US Abdomen Limited RUQ  Result Date: 10/10/2019 CLINICAL DATA:  Elevated liver enzymes.  Abdominal pain. EXAM: ULTRASOUND ABDOMEN LIMITED RIGHT UPPER QUADRANT COMPARISON:  CT, 04/26/2017. FINDINGS: Gallbladder: No gallstones or wall thickening visualized. No sonographic Murphy sign noted by sonographer. Common bile duct: Diameter: 3 mm Liver: Mild increased parenchymal echogenicity. Normal in size. No mass or focal lesion. Portal vein is patent on color Doppler imaging with normal direction of blood flow towards the liver. Other: None. IMPRESSION: 1. No acute findings.  Normal gallbladder.  No bile duct dilation. 2. Mild increased liver parenchymal echogenicity suggests hepatic steatosis. Electronically Signed   By: Lajean Manes M.D.   On: 10/10/2019 16:47    Orson Eva, DO  [facility]  If 7PM-7AM, please contact night-coverage [email or web address] Password [email or web address] 10/29/2019, 9:23 AM   LOS: 1 day

## 2019-10-29 NOTE — Consult Note (Addendum)
Consultation Note Date: 10/29/2019   Patient Name: Albert Hahn  DOB: 20-Feb-1946  MRN: [id number]  Age / Sex: 74 y.o., male  PCP: Asencion Noble, MD Referring Physician: Orson Eva, MD  Reason for Consultation: Establishing goals of care  HPI/Patient Profile: 74 y.o. 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   Clinical Assessment and Goals of Care:  I have reviewed medical records including EPIC notes, labs and imaging, received report from bedside nursing staff, examined the patient and met at bedside with wife of 50 years, Albert Hahn, to discuss diagnosis prognosis, GOC, EOL wishes, disposition and options.  Albert Hahn is resting quietly in bed, sleeping soundly.  He wakes relatively easily, but is confused.  He is able to tell me his name only.  He appears acutely/chronically ill and quite frail.  I introduced Palliative Medicine as specialized medical care for people living with serious illness. It focuses on providing relief from the symptoms and stress of a serious illness.   We discussed a brief life review of the patient.  Albert Hahn tells me that her husband is 180% disabled with the [place].  She attempts to talk about insurance and payer source, but I remind her several times that I am there to discuss medical issues.  [name] shares that her daughters expect her to bring Albert Hahn home, but she feels she needs help.  Mrs. Albert Hahn tells me several times that she believes that the [place] will pay for 24/7 care for Albert Hahn in the home.  I encouraged her to reach out to her [place] provider.  As far as  functional and nutritional status, [name] tells me that Albert Hahn was walking 1 mile daily prior to getting Covid.  She states that he spent a couple weeks in rehab, was doing well.  [name] tells me that her husband discharged on Friday, took 10 steps to get into the house, but needed the walker to go further.  She tells me that he "sat down" in the shower on Saturday because he would not use the built-in shower bench they have.  We discussed current illness and what it means in the larger context of on-going co-morbidities.  Natural disease trajectory and expectations at EOL were discussed.  I attempt to discuss Mr. Witman' chronic health concerns, but Mrs. Albert Hahn is clearly overwhelmed, and is having difficulty focusing on goals of care.  We review selected labs and the treatment plan.  Mrs. Albert Hahn states several times that she needs to know specifically what is happened.  I share with her that Albert Hahn has a UTI, sepsis, and dehydration.  Mrs. Hahn to insist that she feels that Albert Hahn was drinking enough.  I attempted to elicit values and goals of care important to the  patient.  [name] tells me that she has "promised" that she would never put Albert Hahn in a nursing home.  We talked about giving time for outcomes.  I share my concern that the longer it takes for Albert Hahn to recover, the less recovery I expect to see.  Questions and concerns were addressed.  The family was encouraged to call with questions or concerns.  We plan for a palliative team meeting with family tomorrow around 9 AM.  HC POA NEXT OF KIN -spouse of 82 years, Albert Hahn.  They share 2 daughters, Albert Hahn and Albert Hahn.    SUMMARY OF RECOMMENDATIONS   Continue full scope/full code. Considering rehab versus home with home health. Continue CODE STATUS discussions.  Code Status/Advance Care Planning:  Full code -we discussed the concept of treat the treatable but allowing natural passing.  At this point Albert Hahn  states that she would want Albert Hahn to "be resuscitated".  We talked about "attempted" resuscitation.  Symptom Management:   Per hospitalist, no additional needs at this time.  Palliative Prophylaxis:   Oral Care  Additional Recommendations (Limitations, Scope, Preferences):  Full Scope Treatment  Psycho-social/Spiritual:   Desire for further Chaplaincy support:no  Additional Recommendations: Caregiving  Support/Resources and Education on Hospice  Prognosis:   Unable to determine, guarded.  Discharge Planning: To be determined, based on outcomes.      Primary Diagnoses: Present on Admission: . Acute hypokalemia . Sepsis secondary to UTI ([place]) . Acute UTI (urinary tract infection) . PAD (peripheral artery disease) ([place]) . Acute renal failure superimposed on stage 3a chronic kidney disease ([place]) . Acute metabolic encephalopathy   I have reviewed the medical record, interviewed the patient and family, and examined the patient. The following aspects are pertinent.  Past Medical History:  Diagnosis Date  . Anxiety   . Arthritis   . Complication of anesthesia    woke up during lymph node removal, facial surgery  . Depression   . H/O emphysema   . Headache(784.0)   . History of hiatal hernia    "doesnt bother me"  . Hypertension   . 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 PTSD (post-traumatic stress disorder)    Social History   Socioeconomic History  . Marital status: Married    Spouse name: Not on file  . Number of children: Not on file  . Years of education: Not on file  . Highest education level: Not on file  Occupational History  . Occupation: Retired   Tobacco Use  . Smoking status: Former Research scientist (life sciences)  . Smokeless tobacco: Never Used  . Tobacco comment: Stopped smoking 29 years ago   Substance and Sexual Activity  . Alcohol use: No    Alcohol/week: 1.0 standard drinks    Types: 1 Glasses of wine per week  . Drug use: No  . Sexual activity:  Not on file  Other Topics Concern  . Not on file  Social History Narrative   Denies caffeine use    Social Determinants of Health   Financial Resource Strain:   . Difficulty of Paying Living Expenses:   Food Insecurity:   . Worried About Charity fundraiser in the Last Year:   . Arboriculturist in the Last Year:   Transportation Needs:   . Film/video editor (Medical):   Marland Kitchen Lack of Transportation (Non-Medical):   Physical Activity:   . Days of Exercise per Week:   . Minutes of Exercise per  Session:   Stress:   . Feeling of Stress :   Social Connections:   . Frequency of Communication with Friends and Family:   . Frequency of Social Gatherings with Friends and Family:   . Attends Religious Services:   . Active Member of Clubs or Organizations:   . Attends Archivist Meetings:   Marland Kitchen Marital Status:    Family History  Problem Relation Age of Onset  . Healthy Mother   . Healthy Father    Scheduled Meds: . allopurinol  100 mg Oral Daily  . amitriptyline  75 mg Oral QHS  . docusate  500 mg Oral Daily  . enoxaparin (LOVENOX) injection  40 mg Subcutaneous Q24H  . polyvinyl alcohol  1 drop Both Eyes Daily  . potassium chloride  20 mEq Oral Daily  . terazosin  4 mg Oral QHS  . topiramate  100 mg Oral QHS   Continuous Infusions: . 0.9 % NaCl with KCl 20 mEq / L 75 mL/hr at 10/29/19 1056  . cefTRIAXone (ROCEPHIN)  IV 1 g (10/28/19 2355)   PRN Meds:.acetaminophen, albuterol, oxyCODONE, temazepam Medications Prior to Admission:  Prior to Admission medications   Medication Sig Start Date End Date Taking? Authorizing Provider  allopurinol (ZYLOPRIM) 100 MG tablet Take 100 mg by mouth daily.   Yes [provider]  amitriptyline (ELAVIL) 50 MG tablet Take 1 tablet (50 mg total) by mouth at bedtime. Patient taking differently: Take 75 mg by mouth at bedtime.  10/13/19  Yes Tat, Shanon Brow, MD  amLODipine (NORVASC) 10 MG tablet Take 10 mg by mouth daily.   Yes  [provider]  docusate sodium (COLACE) 100 MG capsule Take 500 mg by mouth daily.    Yes [provider]  hydrochlorothiazide (HYDRODIURIL) 25 MG tablet Take 25 mg by mouth daily.   Yes [provider]  oxyCODONE (OXY IR/ROXICODONE) 5 MG immediate release tablet Take 1 tablet (5 mg total) by mouth every 6 (six) hours as needed (for pain.). 10/13/19  Yes Tat, Shanon Brow, MD  Polyethyl Glycol-Propyl Glycol (LUBRICANT EYE DROPS) 0.4-0.3 % SOLN Place 1-2 drops into both eyes daily.   Yes [provider]  temazepam (RESTORIL) 15 MG capsule Take 15 mg by mouth at bedtime as needed for sleep.   Yes [provider]  terazosin (HYTRIN) 2 MG capsule Take 4 mg by mouth at bedtime.   Yes [provider]  topiramate (TOPAMAX) 100 MG tablet Take 100 mg by mouth daily. At bedtime   Yes [provider]  acetaminophen (TYLENOL) 500 MG tablet Take 500-1,000 mg by mouth every 6 (six) hours as needed for mild pain or moderate pain.    [provider]  dexamethasone (DECADRON) 6 MG tablet Take 1 tablet (6 mg total) by mouth daily. X 3 days Patient not taking: Reported on 10/28/2019 10/14/19   Orson Eva, MD   Allergies  Allergen Reactions  . Bee Venom Anaphylaxis  . Shellfish Allergy Anaphylaxis  . Codeine Nausea And Vomiting   Review of Systems  Unable to perform ROS: Acuity of condition    Physical Exam Vitals and nursing note reviewed.  Constitutional:      General: He is not in acute distress.    Appearance: He is ill-appearing.  Cardiovascular:     Rate and Rhythm: Normal rate.  Pulmonary:     Effort: Pulmonary effort is normal. No respiratory distress.  Skin:    General: Skin is warm and dry.  Neurological:  Comments: Oriented to self only at this time.  Psychiatric:     Comments: Calm, but sleepy     Vital Signs: BP 113/79 (BP Location: Left Arm)   Pulse (!) 104   Temp 97.6 F (36.4 C) (Oral)   Resp 20   Ht _0   (1.702 m)   Wt 93 kg   SpO2 98%   BMI 32.11 kg/m  Pain Scale: 0-10   Pain Score: 0-No pain   SpO2: SpO2: 98 % O2 Device:SpO2: 98 % O2 Flow Rate: .O2 Flow Rate (L/min): 2 L/min  IO: Intake/output summary:   Intake/Output Summary (Last 24 hours) at 10/29/2019 1501 Last data filed at 10/29/2019 0900 Gross per 24 hour  Intake 854.31 ml  Output 575 ml  Net 279.31 ml    LBM:   Baseline Weight: Weight: 93 kg Most recent weight: Weight: 93 kg     Palliative Assessment/Data:   Flowsheet Rows     Most Recent Value  Intake Tab  Referral Department  Hospitalist  Unit at Time of Referral  Cardiac/Telemetry Unit  Palliative Care Primary Diagnosis  Sepsis/Infectious Disease  Date Notified  10/29/19  Palliative Care Type  New Palliative care  Reason for referral  Clarify Goals of Care  Date of Admission  10/28/19  Date first seen by Palliative Care  10/29/19  # of days Palliative referral response time  0 Day(s)  # of days IP prior to Palliative referral  1  Clinical Assessment  Palliative Performance Scale Score  30%  Pain Max last 24 hours  Not able to report  Pain Min Last 24 hours  Not able to report  Dyspnea Max Last 24 Hours  Not able to report  Dyspnea Min Last 24 hours  Not able to report  Psychosocial & Spiritual Assessment  Palliative Care Outcomes      Time In: 1500 Time Out: 1550 Time Total: 50 minutes Greater than 50%  of this time was spent counseling and coordinating care related to the above assessment and plan.  Signed by: Drue Novel, NP   Please contact Palliative Medicine Team phone at [phone] for questions and concerns.  For individual provider: See Shea Evans

## 2019-10-29 NOTE — Plan of Care (Signed)
  Problem: Acute Rehab PT Goals(only PT should resolve) Goal: Pt Will Go Supine/Side To Sit Outcome: Progressing Flowsheets (Taken 10/29/2019 0832) Pt will go Supine/Side to Sit: with minimal assist Goal: Pt Will Go Sit To Supine/Side Outcome: Progressing Flowsheets (Taken 10/29/2019 0832) Pt will go Sit to Supine/Side: with minimal assist Goal: Patient Will Perform Sitting Balance Outcome: Progressing Flowsheets (Taken 10/29/2019 8550) Patient will perform sitting balance: with minimal assist Goal: Patient Will Transfer Sit To/From Stand Outcome: Progressing Flowsheets (Taken 10/29/2019 [phone]) Patient will transfer sit to/from stand:  with minimal assist  with moderate assist Goal: Pt Will Transfer Bed To Chair/Chair To Bed Outcome: Progressing Flowsheets (Taken 10/29/2019 0832) Pt will Transfer Bed to Chair/Chair to Bed:  with min assist  with mod assist Goal: Pt Will Ambulate Outcome: Progressing Flowsheets (Taken 10/29/2019 0832) Pt will Ambulate:  15 feet  with moderate assist  with least restrictive assistive device   8:33 AM, 10/29/19 Mearl Latin PT, DPT Physical Therapist at [facility]

## 2019-10-29 NOTE — NC FL2 (Signed)
[place] LEVEL OF CARE SCREENING TOOL     IDENTIFICATION  Patient Name: Albert Hahn Birthdate: August 27, 1945 Sex: male Admission Date (Current Location): 10/28/2019  [facility] and [place] Number:  [employer] and Address:  [facility] [address], [place]      Provider Number: [id number]  Attending Physician Name and Address:  Orson Eva, MD  Relative Name and Phone Number:  Mattheus Rauls (spouse) [facility]: [phone]    Current Level of Care: Hospital Recommended Level of Care: [place] Prior Approval Number:    Date Approved/Denied:   PASRR Number: [id number]  Discharge Plan: SNF    Current Diagnoses: Patient Active Problem List   Diagnosis Date Noted  . Sepsis due to undetermined organism ([place]) 10/29/2019  . Goals of care, counseling/discussion   . Palliative care by specialist   . DNR (do not resuscitate) discussion   . Sepsis secondary to UTI ([place]) 10/28/2019  . Acute UTI (urinary tract infection) 10/28/2019  . PAD (peripheral artery disease) ([place]) 10/28/2019  . Fever 10/11/2019  . Acute encephalopathy   . Transaminasemia   . Acute metabolic encephalopathy 35/36/1443  . Acute renal failure superimposed on stage 3a chronic kidney disease ([place]) 10/09/2019  . Acute respiratory disease due to COVID-19 virus 10/07/2019  . Acute hypokalemia 10/07/2019  . SIRS (systemic inflammatory response syndrome) ([place]) 10/07/2019  . COVID-19 virus infection 10/07/2019  . 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 Non Hodgkin's lymphoma ([place]) 12/29/2011    Orientation RESPIRATION BLADDER Height & Weight     Self, Time, Place  Normal Incontinent Weight: 205 lb 0.4 oz (93 kg) Height:  5\' 7"  (170.2 cm)  BEHAVIORAL SYMPTOMS/MOOD NEUROLOGICAL BOWEL NUTRITION STATUS      Incontinent Diet(Regular)  AMBULATORY STATUS COMMUNICATION OF NEEDS Skin   Limited Assist Verbally Other (Comment)(Abrasion, Ecchymosis)                        Personal Care Assistance Level of Assistance  Bathing, Dressing Bathing Assistance: Limited assistance   Dressing Assistance: Limited assistance     Functional Limitations Info    Sight Info: Impaired(Both eyes) Hearing Info: Impaired Speech Info: Impaired(Both ears)    SPECIAL CARE FACTORS FREQUENCY  PT (By licensed PT)     PT Frequency: 5x's weekly              Contractures Contractures Info: Not present    Additional Factors Info  Code Status, Allergies, Psychotropic, Isolation Precautions Code Status Info: Full Allergies Info: Bee venom; shellfish; codeine Psychotropic Info: Xanax         Current Medications (10/29/2019):  This is the current hospital active medication list Current Facility-Administered Medications  Medication Dose Route Frequency Provider Last Rate Last Admin  . 0.9 % NaCl with KCl 20 mEq/ L  infusion   Intravenous Continuous Tat, Shanon Brow, MD 75 mL/hr at 10/29/19 1056 New Bag at 10/29/19 1056  . acetaminophen (TYLENOL) tablet 500-1,000 mg  500-1,000 mg Oral Q6H PRN Norins, Heinz Knuckles, MD      . albuterol (PROVENTIL) (2.5 MG/3ML) 0.083% nebulizer solution 3 mL  3 mL Inhalation Q4H PRN Norins, Heinz Knuckles, MD   3 mL at 10/28/19 2137  . allopurinol (ZYLOPRIM) tablet 100 mg  100 mg Oral Daily Norins, Heinz Knuckles, MD   100 mg at 10/29/19 1057  . amitriptyline (ELAVIL) tablet 75 mg  75 mg Oral QHS Norins, Heinz Knuckles, MD   75  mg at 10/28/19 2349  . cefTRIAXone (ROCEPHIN) 1 g in sodium chloride 0.9 % 100 mL IVPB  1 g Intravenous Q24H Norins, Heinz Knuckles, MD 200 mL/hr at 10/28/19 2355 1 g at 10/28/19 2355  . docusate (COLACE) 50 MG/5ML liquid 500 mg  500 mg Oral Daily Tat, David, MD   500 mg at 10/29/19 1056  . enoxaparin (LOVENOX) injection 40 mg  40 mg Subcutaneous Q24H Norins, Heinz Knuckles, MD   40 mg at 10/28/19 2352  . oxyCODONE (Oxy IR/ROXICODONE) immediate release tablet 5 mg  5 mg Oral Q6H PRN Norins, Heinz Knuckles, MD      . polyvinyl alcohol (LIQUIFILM TEARS)  1.4 % ophthalmic solution 1 drop  1 drop Both Eyes Daily Norins, Heinz Knuckles, MD   1 drop at 10/29/19 1057  . potassium chloride 20 MEQ/15ML (10%) solution 20 mEq  20 mEq Oral Daily Tat, David, MD   20 mEq at 10/29/19 1057  . temazepam (RESTORIL) capsule 15 mg  15 mg Oral QHS PRN Norins, Heinz Knuckles, MD      . terazosin (HYTRIN) capsule 4 mg  4 mg Oral QHS Norins, Heinz Knuckles, MD   4 mg at 10/28/19 2349  . topiramate (TOPAMAX) tablet 100 mg  100 mg Oral QHS Norins, Heinz Knuckles, MD   100 mg at 10/28/19 2349     Discharge Medications: Please see discharge summary for a list of discharge medications.  Relevant Imaging Results:  Relevant Lab Results:   Additional Information SSN#: [id number]  Sherie Don, LCSW

## 2019-10-29 NOTE — TOC Initial Note (Signed)
Transition of Care [facility]) - Initial/Assessment Note    Patient Details  Name: Albert Hahn MRN: [id number] Date of Birth: [birth year]  Transition of Care [facility]) CM/SW Contact:    Albert Weatherbee Dimitri Ped, LCSW Phone Number: 10/29/2019, 4:26 PM  Clinical Narrative:    CSW in contact with patients spouse, Albert Hahn to conduct TOC assessment. Albert Hahn is a 74 y.o. male with medical history significant of recent hospitalizaton for covid 19 PNA.  Prior to being admitted into the hospital, Albert Hahn reports that patient resided at home with her. Patient recently had a 2 week stay at [facility]. After being discharged, Albert Hahn reports that patient came home and became disoriented. Albert Hahn goes into detail and states that she called for EMS to assist with transport to hospital and upon arrival EMS suspected a UTI.   Albert Hahn states that she does not want him to discharge back to [facility] because she believes that while there, his UTI went untreated. While at home, patient uses a wheelchair to assist with ambulation. Patient also has a shower chair to assist with bathroom needs. Albert Hahn reports that prior to being diagnosed with COVID that he was independent and used to walk 1 mile a day. Wife explains that she is prepared to hire private duty nurse to assist with 24 hour supervision if necessary.   PT is recommending SNF for discharge plan. TOC team will continue to follow patient for discharge related needs  [place] Transitions of Care  Clinical Social Worker  Ph: [phone]   Expected Discharge Plan: [place] Barriers to Discharge: Continued Medical Work up   Patient Goals and CMS Choice Patient states their goals for this hospitalization and ongoing recovery are:: spouse would like for him to get better and then to return home.   Choice offered to / list presented to : Spouse  Expected Discharge Plan and Services Expected Discharge Plan: [place] In-house Referral: Clinical Social Work Discharge Planning Services: CM Consult   Living arrangements for the past 2 months: [place]: [place] ([place])        Prior Living Arrangements/Services Living arrangements for the past 2 months: Single Family Home Lives with:: Spouse Patient language and need for interpreter reviewed:: Yes Do you feel safe going back to the place where you live?: Yes      Need for Family Participation in Patient Care: Yes (Comment) Care giver support system in place?: Yes (comment) Current home services: Home PT Criminal Activity/Legal Involvement Pertinent to Current Situation/Hospitalization: No - Comment as needed  Activities of Daily Living Home Assistive Devices/Equipment: None ADL Screening (condition at time of admission) Patient's cognitive ability adequate to safely complete daily activities?: Yes Is the patient deaf or have difficulty hearing?: No Does the patient have difficulty seeing, even when wearing glasses/contacts?: No Does the patient have difficulty concentrating, remembering, or making decisions?: No Patient able to express need for assistance with ADLs?: Yes Does the patient have difficulty dressing or bathing?: No Independently performs ADLs?: Yes (appropriate for developmental age) Does the patient have difficulty walking or climbing stairs?: Yes Weakness of Legs: Both Weakness of Arms/Hands: Both  Permission Sought/Granted Permission sought to share information with : Family Supports Permission granted to share information with :  Yes, Release of Information Signed  Share Information with NAME: Albert Hahn  Permission granted to share info w AGENCY: [facility]  Permission granted to share info w Relationship: Spouse  Permission granted to share info w Contact Information: PH:[phone]  Emotional Assessment Appearance:: Appears stated  age Attitude/Demeanor/Rapport: Unable to Assess Affect (typically observed): Unable to Assess Orientation: : Oriented to Self, Oriented to Place Alcohol / Substance Use: Not Applicable Psych Involvement: No (comment)  Admission diagnosis:  PAD (peripheral artery disease) (HCC) [I73.9] Acute UTI (urinary tract infection) [N39.0] Fever, unspecified fever cause [R50.9] Urinary tract infection without hematuria, site unspecified [N39.0] Patient Active Problem List   Diagnosis Date Noted  . Sepsis due to undetermined organism ([place]) 10/29/2019  . Sepsis secondary to UTI ([place]) 10/28/2019  . Acute UTI (urinary tract infection) 10/28/2019  . PAD (peripheral artery disease) ([place]) 10/28/2019  . Fever 10/11/2019  . Acute encephalopathy   . Transaminasemia   . Acute metabolic encephalopathy 62/94/7654  . Acute renal failure superimposed on stage 3a chronic kidney disease ([place]) 10/09/2019  . Acute respiratory disease due to COVID-19 virus 10/07/2019  . Acute hypokalemia 10/07/2019  . SIRS (systemic inflammatory response syndrome) ([place]) 10/07/2019  . COVID-19 virus infection 10/07/2019  . 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 Non Hodgkin's lymphoma ([place]) 12/29/2011   PCP:  Albert Noble, MD Pharmacy:   [facility], [address] AT [address] [place] [address] Phone: [phone] Fax: [phone]     Social Determinants of Health (SDOH) Interventions    Readmission Risk Interventions No flowsheet data found.

## 2019-10-29 NOTE — Evaluation (Signed)
Occupational Therapy Evaluation Patient Details Name: Albert Hahn MRN: [id number] DOB: 1945/10/30 Today's Date: 10/29/2019    History of Present Illness Albert Hahn is a 74 y.o. male with medical history significant of recent hospitalizaton for covid 19 PNA. He was discharged to [facility] 2/2 weakness and assist with ADLs. He was discharged home Friday 10/25/19 in good condition, awake and alert. Starting Saturday he became weak and family had a difficult time providing assistance. He developed increased weakness and foul smelling urine. EMS was called and brought patient to [facility] for evaluation. Per wife when EMT's tried to move him he cried out with pain in his legs.   Clinical Impression   Pt agreeable to OT/PT co-evaluation this am. Pt with confusion throughout session, emotionally labile upon sitting at EOB with crying and saying "I can't do this." Pt requiring heavy assistance for ADLs due to generalized weakness and confusion limiting follow through. Recommend SNF on discharge to increase safety and independence in ADLs and mobility tasks.     Follow Up Recommendations  SNF    Equipment Recommendations  None recommended by OT       Precautions / Restrictions Precautions Precautions: Fall Restrictions Weight Bearing Restrictions: No      Mobility Bed Mobility Overal bed mobility: Needs Assistance Bed Mobility: Supine to Sit;Rolling Rolling: Mod assist;Max assist   Supine to sit: Mod assist;Max assist;HOB elevated     General bed mobility comments: slow labored movement, frequent verbal and tactile cueing for sequencing with limited carry over, mod/max assist to transition to seated EOB and assist to remain seated  Transfers                 General transfer comment: not completed        ADL either performed or assessed with clinical judgement   ADL Overall ADL's : Needs assistance/impaired     Grooming: Set up;Bed level Grooming Details  (indicate cue type and reason): unable to maintain sitting balance for tasks in sitting             Lower Body Dressing: Total assistance;Bed level                 General ADL Comments: Limited assessment of ADLs due to cognition and emotional lability during session. Suspect pt requiring increased assistance at this time due to confusion     Vision Baseline Vision/History: No visual deficits Patient Visual Report: No change from baseline Vision Assessment?: No apparent visual deficits            Pertinent Vitals/Pain Pain Assessment: Faces Faces Pain Scale: Hurts even more Pain Location: buttocks Pain Descriptors / Indicators: Grimacing;Guarding Pain Intervention(s): Limited activity within patient's tolerance;Monitored during session;Repositioned     Hand Dominance Right   Extremity/Trunk Assessment Upper Extremity Assessment Upper Extremity Assessment: Generalized weakness(LUE weakness at baseline per chart review)   Lower Extremity Assessment Lower Extremity Assessment: Defer to PT evaluation   Cervical / Trunk Assessment Cervical / Trunk Assessment: Normal   Communication Communication Communication: No difficulties   Cognition Arousal/Alertness: Awake/alert;Lethargic Behavior During Therapy: Flat affect Overall Cognitive Status: No family/caregiver present to determine baseline cognitive functioning                                 General Comments: emotionally labile, requires repeated verbal/tactile cueing to complete functional tasks  Home Living Family/patient expects to be discharged to:: Private residence Living Arrangements: Spouse/significant other Available Help at Discharge: Family;Available 24 hours/day Type of Home: House Home Access: Stairs to enter [employer] of Steps: 4 Entrance Stairs-Rails: Right Home Layout: One level     Bathroom Shower/Tub: Occupational psychologist:  Standard Bathroom Accessibility: Yes   Home Equipment: Environmental consultant - 2 wheels;Cane - single point;Shower seat;Bedside commode;Wheelchair - manual;Hospital bed   Additional Comments: Patient appears to be a poor historian of home set up and PLOF      Prior Functioning/Environment Level of Independence: Needs assistance  Gait / Transfers Assistance Needed: patient states uses RW for mobility ADL's / Homemaking Assistance Needed: Pt recently [facility] from [facility]. Family available to assist with ADLs as needed   Comments: Pt with confusion, poor historian of PLOF and assist needed        OT Problem List: Decreased strength;Decreased activity tolerance;Impaired balance (sitting and/or standing);Decreased safety awareness;Decreased cognition;Impaired UE functional use      OT Treatment/Interventions:      OT Goals(Current goals can be found in the care plan section) Acute Rehab OT Goals Patient Stated Goal: return home  OT Frequency:             Co-evaluation PT/OT/SLP Co-Evaluation/Treatment: Yes Reason for Co-Treatment: Complexity of the patient's impairments (multi-system involvement);Necessary to address cognition/behavior during functional activity;To address functional/ADL transfers PT goals addressed during session: Mobility/safety with mobility OT goals addressed during session: ADL's and self-care         End of Session Nurse Communication: Mobility status  Activity Tolerance: Patient limited by lethargy Patient left: in bed;with call bell/phone within reach;with bed alarm set  OT Visit Diagnosis: Muscle weakness (generalized) (M62.81)                Time: 5465-0354 OT Time Calculation (min): 20 min Charges:  OT General Charges $OT Visit: 1 Visit OT Evaluation $OT Eval Low Complexity: [place], OTR/L  [phone] 10/29/2019, 8:51 AM

## 2019-10-30 LAB — CBC
HCT: 32.2 % — ABNORMAL LOW (ref 39.0–52.0)
Hemoglobin: 10.7 g/dL — ABNORMAL LOW (ref 13.0–17.0)
MCH: 31.9 pg (ref 26.0–34.0)
MCHC: 33.2 g/dL (ref 30.0–36.0)
MCV: 96.1 fL (ref 80.0–100.0)
Platelets: 212 10*3/uL (ref 150–400)
RBC: 3.35 MIL/uL — ABNORMAL LOW (ref 4.22–5.81)
RDW: 13.6 % (ref 11.5–15.5)
WBC: 8.1 10*3/uL (ref 4.0–10.5)
nRBC: 0 % (ref 0.0–0.2)

## 2019-10-30 LAB — BASIC METABOLIC PANEL
Anion gap: 9 (ref 5–15)
BUN: 25 mg/dL — ABNORMAL HIGH (ref 8–23)
CO2: 22 mmol/L (ref 22–32)
Calcium: 8.2 mg/dL — ABNORMAL LOW (ref 8.9–10.3)
Chloride: 103 mmol/L (ref 98–111)
Creatinine, Ser: 1.43 mg/dL — ABNORMAL HIGH (ref 0.61–1.24)
GFR calc Af Amer: 56 mL/min — ABNORMAL LOW (ref >60)
GFR calc non Af Amer: 49 mL/min — ABNORMAL LOW (ref >60)
Glucose, Bld: 98 mg/dL (ref 70–99)
Potassium: 3.1 mmol/L — ABNORMAL LOW (ref 3.5–5.1)
Sodium: 134 mmol/L — ABNORMAL LOW (ref 135–145)

## 2019-10-30 LAB — MAGNESIUM: Magnesium: 1.9 mg/dL (ref 1.7–2.4)

## 2019-10-30 MED ORDER — POTASSIUM CHLORIDE IN NACL 20-0.9 MEQ/L-% IV SOLN: INTRAVENOUS | Status: AC

## 2019-10-30 MED ORDER — POTASSIUM CHLORIDE 20 MEQ/15ML (10%) PO SOLN
40.0000 meq | Freq: Every day | ORAL | Status: DC
  Administered 2019-10-30 – 2019-10-31 (×2): 40 meq via ORAL
  Filled 2019-10-30 (×2): qty 30

## 2019-10-30 NOTE — TOC Progression Note (Signed)
Transition of Care [facility]) - Progression Note    Patient Details  Name: Albert Hahn MRN: [id number] Date of Birth: [birth year]  Transition of Care [facility] CM/SW Contact  Shade Flood, LCSW Phone Number: 10/30/2019, 12:09 PM  Clinical Narrative:     TOC following. SNF bed offers are available though pt and his wife are now declining SNF placement. Pt is active with [facility] for PT/OT/ST. Will ask MD for resumption orders for dc. Anticipating dc 3/18.   Expected Discharge Plan: [place] Barriers to Discharge: Continued Medical Work up  Expected Discharge Plan and Services Expected Discharge Plan: [place] In-house Referral: Clinical Social Work Discharge Planning Services: CM Consult   Living arrangements for the past 2 months: [place]: [place] ([facility])         Social Determinants of Health (SDOH) Interventions    Readmission Risk Interventions No flowsheet data found.

## 2019-10-30 NOTE — Clinical Social Work Note (Signed)
CSW attempted to call Rosann Auerbach with the [place] in [place] [phone] or [phone], her cell) to follow up with benefits for home care services for the patient, but was unable to reach her or leave voicemail. Operator provided another social worker's phone number Mickel Baas: [phone] ext. [phone]) to call during business hours (8am-4:30pm). Per daughter, she is requesting referral be sent to [place] for SNF if patient does not have sufficient benefits to cover his care should he discharge home. Additional SNF referral sent to [facility].  Tobi Bastos, LCSW Transitions of Care Clinical Social Worker [facility] Ph: [phone]

## 2019-10-30 NOTE — Progress Notes (Addendum)
PROGRESS NOTE  Albert Hahn Early NGE:[id number] DOB: May 30, 1946 DOA: 10/28/2019 PCP: Albert Noble, MD  Brief History:  74 y.o.malewith past medical history relevant for history ofNHL,VP shunt for NPH, PTSD, HTN, with recent COVID-19 infection for which he was hospitalized 10/07/19-10/13/19.  He was discharged to [facility] on 10/13/19.  He was doing well and discharged home on 10/25/19.  Over the next 24-48 hours, family noted pt had gradual worsening of generalized weakness and confusion.  There was concern for UTI.  He was taken to UC on 10/26/19 where apparently a UA was obtained, but the results were unknown.  Nevertheless, the patient had worsening generalized weakness requiring greater amount of assistance with transfers and increasing confusion.  As result, the patient was brought to emergency department for the evaluation.  There is no reports of chest pain, shortness breath, nausea, vomiting, diarrhea, syncope. In the emergency department, the patient had a temperature up to 101.7 F with tachycardia in the 110s.  He was hemodynamically stable with oxygen saturation 98% on room air.  UA showed 21-50 WBC.  Labs showed potassium 3.0, sodium 133, serum creatinine 122.  WBC was 10.2 with hemoglobin 12.7 and platelets 237,000.  The patient was admitted for sepsis secondary to UTI.  He was started on ceftriaxone.  Assessment/Plan: Sepsis - RESOLVED  -Present at the time of admission -Secondary to UTI -Patient presented with fever up to 101.7 F with tachycardia -Lactic acid peaked 1.5  UTI -UA 21-50 WBC, urine culture indeterminate -Continue ceftriaxone x 3 full doses, then stop   Acute on chronic renal failure--CKD 3a -baseline creatinine 1.3-1.4 -serum creatinine peaked 1.82 -due to volume depletion and colchicine -continue IVF-->improving  Acute metabolic Encephalopathy -pt has underlying cognitive impairment -worsen due to infectious process and renal failure and hospital  delirium -TSH--1.637 -[id number] -folate--15.1 -TSH--1.637 -remains pleasantly confused -MR brain cannot be obtained due to shunt incompatibility -DC restoril  COVID-19 Infection--recovered -no hypoxia  Cognitive Impairment -07/25/19 note from [facility] states--longstanding left-sided weakness, with associated gait difficulty with 9 falls over the last few months -They also noted hx of irrational thoughts and cognitive changes -need formal neuropsychiatric eval in future once stable  HTN -holding amlodipine due to soft BPs  Hypokalemia -replete -mag 1.9  Anxiety/Depression/PTSD -continue alprazolam, topiramate, Elavil at hs  Disposition Plan: Patient From: WIFE AND PATIENT DECLINE SNF, HOME WITH [facility] 10/31/19 D/C Place: HOME WITH HH Barriers: continue IV ceftriaxone x 1 more day  Family Communication:   Wife updated at bedside  Consultants:    Code Status:  DNR   DVT Prophylaxis:  enoxaparin   Procedures: As Listed in Progress Note Above  Antibiotics: None  Subjective: Pt reports feeling better today, he is more alert and vocal, no complaints today.    Objective: Vitals:   10/29/19 2043 10/30/19 0526 10/30/19 0528 10/30/19 0528  BP:  (!) 106/58 100/62 100/62  Pulse:  92 95 93  Resp:  16    Temp:  98.2 F (36.8 C)    TempSrc:      SpO2: 98% 98% 99% 99%  Weight:      Height:        Intake/Output Summary (Last 24 hours) at 10/30/2019 1038 Last data filed at 10/30/2019 0523 Gross per 24 hour  Intake 370.44 ml  Output 600 ml  Net -229.56 ml   Weight change:  Exam:   General:  Awake, alert, NAD, cooperative.   HEENT: NCAT.  Cardiovascular: normal s1,s2 sounds.   Respiratory: shallow BS bilateral. No wheeze  Abdomen: soft, ND/NT, no HSM.   Extremities: no cyanosis or clubbing.   Data Reviewed: I have personally reviewed following labs and imaging studies Basic Metabolic Panel: Recent Labs  Lab 10/28/19 1258 10/29/19 0454 10/30/19 0455    NA 133* 135 134*  K 3.0* 3.0* 3.1*  CL 100 102 103  CO2 20* 21* 22  GLUCOSE 108* 104* 98  BUN 27* 26* 25*  CREATININE 1.82* 1.69* 1.43*  CALCIUM 8.5* 8.4* 8.2*  MG  --  1.9 1.9   Liver Function Tests: Recent Labs  Lab 10/28/19 1258  AST 55*  ALT 61*  ALKPHOS 126  BILITOT 0.5  PROT 7.3  ALBUMIN 2.7*   No results for input(s): LIPASE, AMYLASE in the last 168 hours. No results for input(s): AMMONIA in the last 168 hours. Coagulation Profile: Recent Labs  Lab 10/28/19 1258  INR 1.2   CBC: Recent Labs  Lab 10/28/19 1258 10/29/19 0454 10/30/19 0455  WBC 10.2 10.5 8.1  NEUTROABS 8.3*  --   --   HGB 12.7* 11.3* 10.7*  HCT 37.8* 34.6* 32.2*  MCV 95.2 96.1 96.1  PLT 237 213 212   Cardiac Enzymes: No results for input(s): CKTOTAL, CKMB, CKMBINDEX, TROPONINI in the last 168 hours. BNP: Invalid input(s): POCBNP CBG: Recent Labs  Lab 10/29/19 0744 10/29/19 1125 10/29/19 1629  GLUCAP 92 125* 122*   HbA1C: No results for input(s): HGBA1C in the last 72 hours. Urine analysis:    Component Value Date/Time   COLORURINE AMBER (A) 10/28/2019 1242   APPEARANCEUR HAZY (A) 10/28/2019 1242   LABSPEC 1.013 10/28/2019 1242   LABSPEC 1.025 09/06/2006 1104   PHURINE 7.0 10/28/2019 1242   GLUCOSEU NEGATIVE 10/28/2019 1242   HGBUR MODERATE (A) 10/28/2019 1242   BILIRUBINUR NEGATIVE 10/28/2019 1242   BILIRUBINUR Negative 09/06/2006 1104   KETONESUR NEGATIVE 10/28/2019 1242   PROTEINUR 100 (A) 10/28/2019 1242   UROBILINOGEN 1.0 08/20/2008 1340   NITRITE POSITIVE (A) 10/28/2019 1242   LEUKOCYTESUR LARGE (A) 10/28/2019 1242   LEUKOCYTESUR Trace 09/06/2006 1104   Recent Results (from the past 240 hour(s))  Urine culture     Status: Abnormal   Collection Time: 10/28/19 12:42 PM   Specimen: In/Out Cath Urine  Result Value Ref Range Status   Specimen Description   Final    IN/OUT CATH URINE Performed at [facility], [address], [place], [place] [address]    Special  Requests   Final    NONE Performed at [facility], [address], [place], [place] [address]    Culture MULTIPLE SPECIES PRESENT, SUGGEST RECOLLECTION (A)  Final   Report Status 10/29/2019 FINAL  Final  Blood Culture (routine x 2)     Status: None (Preliminary result)   Collection Time: 10/28/19 12:58 PM   Specimen: Right Antecubital; Blood  Result Value Ref Range Status   Specimen Description   Final    RIGHT ANTECUBITAL BOTTLES DRAWN AEROBIC AND ANAEROBIC   Special Requests Blood Culture adequate volume  Final   Culture   Final    NO GROWTH 2 DAYS Performed at [facility], [address] [place], [place] [address]    Report Status PENDING  Incomplete  Blood Culture (routine x 2)     Status: None (Preliminary result)   Collection Time: 10/28/19  1:12 PM   Specimen: Left Antecubital; Blood  Result Value Ref Range Status   Specimen Description  Final    LEFT ANTECUBITAL BOTTLES DRAWN AEROBIC AND ANAEROBIC   Special Requests Blood Culture adequate volume  Final   Culture   Final    NO GROWTH 2 DAYS Performed at [facility], [address], [place], [place] [address]    Report Status PENDING  Incomplete  MRSA PCR Screening     Status: None   Collection Time: 10/29/19 12:41 AM   Specimen: Nasopharyngeal  Result Value Ref Range Status   MRSA by PCR NEGATIVE NEGATIVE Final    Comment:        The GeneXpert MRSA Assay (FDA approved for NASAL specimens only), is one component of a comprehensive MRSA colonization surveillance program. It is not intended to diagnose MRSA infection nor to guide or monitor treatment for MRSA infections. Performed at [facility], [address], [place], [place] [address]      Scheduled Meds: . allopurinol  100 mg Oral Daily  . amitriptyline  75 mg Oral QHS  . docusate  500 mg Oral Daily  . enoxaparin (LOVENOX) injection  40 mg Subcutaneous Q24H  . polyvinyl alcohol  1 drop Both Eyes Daily  . potassium chloride  40 mEq Oral Daily  .  terazosin  4 mg Oral QHS  . topiramate  100 mg Oral QHS   Continuous Infusions: . cefTRIAXone (ROCEPHIN)  IV 1 g (10/29/19 2259)    Procedures/Studies: CT ABDOMEN PELVIS WO CONTRAST  Result Date: 10/11/2019 CLINICAL DATA:  Cough, dyspnea. COVID-19 positive. Abdominal distension. Remote history of lymphoma. EXAM: CT CHEST, ABDOMEN AND PELVIS WITHOUT CONTRAST TECHNIQUE: Multidetector CT imaging of the chest, abdomen and pelvis was performed following the standard protocol without IV contrast. COMPARISON:  CT AP 04/26/2017 in CT chest 12/22/2011. FINDINGS: CT CHEST FINDINGS Cardiovascular: The heart size is normal. Aortic atherosclerosis. Lad, left circumflex and RCA coronary artery calcifications. Mediastinum/Nodes: Normal appearance of the thyroid gland. The trachea appears patent and is midline. No enlarged mediastinal or hilar lymph nodes. No axillary or supraclavicular adenopathy. Lungs/Pleura: Exam detail is diminished due to motion artifact. No pleural effusions. Patchy area of ground-glass attenuation is identified within the lateral left lower lobe, image 92/3. Mild bilateral peripheral areas of ground-glass attenuation overlying the right upper lobe posterior lower lobes and subpleural medial left upper lobe noted. No airspace consolidation, atelectasis or pneumothorax. Musculoskeletal: No chest wall mass or suspicious bone lesions identified. CT ABDOMEN PELVIS FINDINGS Hepatobiliary: No focal liver abnormality is seen. No gallstones, gallbladder wall thickening, or biliary dilatation. Pancreas: Unremarkable. No pancreatic ductal dilatation or surrounding inflammatory changes. Spleen: Normal in size without focal abnormality. Adrenals/Urinary Tract: Normal adrenal glands. No kidney mass or hydronephrosis. Urinary bladder normal. Stomach/Bowel: Small hiatal hernia. The stomach is nondistended. There are no dilated loops of small or large bowel. No bowel wall thickening, inflammation or distension.  The appendix is not confidently identified. Vascular/Lymphatic: Aortic atherosclerosis. No aneurysm. No abdominopelvic adenopathy. Reproductive: Prostate is unremarkable. Other: Fat containing left inguinal hernia noted. Right-sided ventriculoperitoneal shunt tube is identified which terminates in the upper abdomen. Musculoskeletal: Sclerotic appearing L3 vertebral body is again noted with changes compatible with previous kyphoplasty. IMPRESSION: 1. Bilateral peripheral predominant areas of ground-glass attenuation involving the upper and lower lung zones compatible with atypical pneumonia. No lobar consolidation, atelectasis or pneumothorax. 2. No acute findings within the abdomen or pelvis 3. Small hiatal hernia 4. Coronary artery calcifications Aortic Atherosclerosis (ICD10-I70.0). Electronically Signed   By: Kerby Moors M.D.   On: 10/11/2019 14:26   CT HEAD  WO CONTRAST  Result Date: 10/10/2019 CLINICAL DATA:  Encephalopathy EXAM: CT HEAD WITHOUT CONTRAST TECHNIQUE: Contiguous axial images were obtained from the base of the skull through the vertex without intravenous contrast. COMPARISON:  10/07/2019 FINDINGS: Brain: Right frontal approach shunt catheter is stable in position. Discontinued right parietal approach shunt catheter again noted. Ventricle caliber is stable. There is no acute intracranial hemorrhage or mass effect. There is no new loss of gray-white differentiation. Nonspecific primarily periventricular white matter hypoattenuation is unchanged. Vascular: No hyperdense vessel. Skull: Calvarium is unremarkable apart from burr holes. Sinuses/Orbits: No new finding. Chronic sphenoid sinusitis is again noted. Other: None. IMPRESSION: No acute abnormality. Stable appearance compared to 10/07/2019 examination. Electronically Signed   By: Macy Mis M.D.   On: 10/10/2019 17:12   CT Head Wo Contrast  Result Date: 10/07/2019 CLINICAL DATA:  Encephalopathy, VP shunt EXAM: CT HEAD WITHOUT CONTRAST  TECHNIQUE: Contiguous axial images were obtained from the base of the skull through the vertex without intravenous contrast. COMPARISON:  01/06/2017 FINDINGS: Brain: Right frontal approach shunt catheter is new from the prior study and terminates near midline within the interhemispheric fissure. Right parietal approach catheter has been discontinued. Ventricle caliber is similar. There is no acute intracranial hemorrhage or mass effect. No new loss of gray differentiation. Nonspecific primarily periventricular white matter hypoattenuation. Vascular: No hyperdense vessel. Skull: Unremarkable apart from burr holes. Sinuses/Orbits: Patchy mucosal thickening with chronic sphenoid sinusitis. Other: Mastoid air cells are clear. IMPRESSION: Interval placement of right frontal approach shunt catheter. Similar enlargement of the third and lateral ventricles. No acute intracranial hemorrhage, mass effect, or evidence of acute infarction. Electronically Signed   By: Macy Mis M.D.   On: 10/07/2019 11:16   CT CHEST WO CONTRAST  Result Date: 10/11/2019 CLINICAL DATA:  Cough, dyspnea. COVID-19 positive. Abdominal distension. Remote history of lymphoma. EXAM: CT CHEST, ABDOMEN AND PELVIS WITHOUT CONTRAST TECHNIQUE: Multidetector CT imaging of the chest, abdomen and pelvis was performed following the standard protocol without IV contrast. COMPARISON:  CT AP 04/26/2017 in CT chest 12/22/2011. FINDINGS: CT CHEST FINDINGS Cardiovascular: The heart size is normal. Aortic atherosclerosis. Lad, left circumflex and RCA coronary artery calcifications. Mediastinum/Nodes: Normal appearance of the thyroid gland. The trachea appears patent and is midline. No enlarged mediastinal or hilar lymph nodes. No axillary or supraclavicular adenopathy. Lungs/Pleura: Exam detail is diminished due to motion artifact. No pleural effusions. Patchy area of ground-glass attenuation is identified within the lateral left lower lobe, image 92/3. Mild  bilateral peripheral areas of ground-glass attenuation overlying the right upper lobe posterior lower lobes and subpleural medial left upper lobe noted. No airspace consolidation, atelectasis or pneumothorax. Musculoskeletal: No chest wall mass or suspicious bone lesions identified. CT ABDOMEN PELVIS FINDINGS Hepatobiliary: No focal liver abnormality is seen. No gallstones, gallbladder wall thickening, or biliary dilatation. Pancreas: Unremarkable. No pancreatic ductal dilatation or surrounding inflammatory changes. Spleen: Normal in size without focal abnormality. Adrenals/Urinary Tract: Normal adrenal glands. No kidney mass or hydronephrosis. Urinary bladder normal. Stomach/Bowel: Small hiatal hernia. The stomach is nondistended. There are no dilated loops of small or large bowel. No bowel wall thickening, inflammation or distension. The appendix is not confidently identified. Vascular/Lymphatic: Aortic atherosclerosis. No aneurysm. No abdominopelvic adenopathy. Reproductive: Prostate is unremarkable. Other: Fat containing left inguinal hernia noted. Right-sided ventriculoperitoneal shunt tube is identified which terminates in the upper abdomen. Musculoskeletal: Sclerotic appearing L3 vertebral body is again noted with changes compatible with previous kyphoplasty. IMPRESSION: 1. Bilateral peripheral predominant areas of ground-glass attenuation  involving the upper and lower lung zones compatible with atypical pneumonia. No lobar consolidation, atelectasis or pneumothorax. 2. No acute findings within the abdomen or pelvis 3. Small hiatal hernia 4. Coronary artery calcifications Aortic Atherosclerosis (ICD10-I70.0). Electronically Signed   By: Kerby Moors M.D.   On: 10/11/2019 14:26   CT Angio Chest PE W and/or Wo Contrast  Result Date: 10/22/2019 CLINICAL DATA:  PE suspected, high prob Chest pain. Diagnosed with COVID 2 weeks ago. EXAM: CT ANGIOGRAPHY CHEST WITH CONTRAST TECHNIQUE: Multidetector CT imaging of  the chest was performed using the standard protocol during bolus administration of intravenous contrast. Multiplanar CT image reconstructions and MIPs were obtained to evaluate the vascular anatomy. CONTRAST:  136mL OMNIPAQUE IOHEXOL 350 MG/ML SOLN COMPARISON:  Chest CT 10/11/2019, radiograph earlier this day. FINDINGS: Cardiovascular: Motion limited evaluation for pulmonary embolus. Apices and basilar evaluation is particularly limited. Allowing for limitations, no discrete filling defects in the pulmonary arteries to suggest pulmonary embolus. Aortic atherosclerosis without aortic dissection. 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 Mild cardiomegaly with coronary artery calcifications. Mediastinum/Nodes: Small mediastinal lymph nodes, all subcentimeter, likely reactive. No enlarged hilar lymph nodes. No esophageal wall thickening. Tiny hiatal hernia. No thyroid nodule. Lungs/Pleura: Breathing motion artifact limits assessment. Persistent but improved peripheral ground-glass opacities involving the right greater than left lung. Mild dependent atelectasis with mild pleural thickening, no discrete pleural effusion. Upper Abdomen: Calcified hepatic granuloma. No acute upper abdominal findings. Musculoskeletal: There are no acute or suspicious osseous abnormalities. Degenerative change in the spine. Postsurgical change in the lower cervical spine, partially included. Intra-articular bodies posterior to the right shoulder. Review of the MIP images confirms the above findings. IMPRESSION: 1. Motion limited exam. Allowing for motion limitations, no filling defects in the pulmonary arteries to suggest pulmonary embolus. 2. Persistent but improved peripheral ground-glass opacities in the right greater than left lung. Findings consistent with sequela of COVID pneumonia. Mild dependent atelectasis. 3. Cardiomegaly with coronary artery calcifications. 4. Aortic tortuosity with use warm aneurysmal  dilatation of the ascending aorta, maximal dimension 4.2 cm. Recommend annual imaging followup by CTA or MRA. This recommendation follows 2010 ACCF/AHA/AATS/ACR/ASA/SCA/SCAI/SIR/STS/SVM Guidelines for the Diagnosis and Management of Patients with Thoracic Aortic Disease. Circulation. 2010; 121: W389-H734. Aortic aneurysm NOS (ICD10-I71.9) Aortic Atherosclerosis (ICD10-I70.0). Electronically Signed   By: Keith Rake M.D.   On: 10/22/2019 20:17   US Venous Img Lower Bilateral (DVT)  Result Date: 10/08/2019 CLINICAL DATA:  Concern for pulmonary embolism. Bilateral lower extremity edema. EXAM: BILATERAL LOWER EXTREMITY VENOUS DOPPLER ULTRASOUND TECHNIQUE: Gray-scale sonography with graded compression, as well as color Doppler and duplex ultrasound were performed to evaluate the lower extremity deep venous systems from the level of the common femoral vein and including the common femoral, femoral, profunda femoral, popliteal and calf veins including the posterior tibial, peroneal and gastrocnemius veins when visible. The superficial great saphenous vein was also interrogated. Spectral Doppler was utilized to evaluate flow at rest and with distal augmentation maneuvers in the common femoral, femoral and popliteal veins. COMPARISON:  None. FINDINGS: RIGHT LOWER EXTREMITY Common Femoral Vein: No evidence of thrombus. Normal compressibility, respiratory phasicity and response to augmentation. Saphenofemoral Junction: No evidence of thrombus. Normal compressibility and flow on color Doppler imaging. Profunda Femoral Vein: No evidence of thrombus. Normal compressibility and flow on color Doppler imaging. Femoral Vein: No evidence of thrombus. Normal compressibility, respiratory phasicity and response to augmentation. Popliteal Vein: No evidence of thrombus. Normal compressibility, respiratory phasicity and response to  augmentation. Calf Veins: No evidence of thrombus. Normal compressibility and flow on color Doppler  imaging. Superficial Great Saphenous Vein: No evidence of thrombus. Normal compressibility. Venous Reflux:  None. Other Findings: No evidence of superficial thrombophlebitis or abnormal fluid collection. LEFT LOWER EXTREMITY Common Femoral Vein: No evidence of thrombus. Normal compressibility, respiratory phasicity and response to augmentation. Saphenofemoral Junction: No evidence of thrombus. Normal compressibility and flow on color Doppler imaging. Profunda Femoral Vein: No evidence of thrombus. Normal compressibility and flow on color Doppler imaging. Femoral Vein: No evidence of thrombus. Normal compressibility, respiratory phasicity and response to augmentation. Popliteal Vein: No evidence of thrombus. Normal compressibility, respiratory phasicity and response to augmentation. Calf Veins: No evidence of thrombus. Normal compressibility and flow on color Doppler imaging. Superficial Great Saphenous Vein: No evidence of thrombus. Normal compressibility. Venous Reflux:  None. Other Findings: No evidence of superficial thrombophlebitis or abnormal fluid collection. IMPRESSION: No evidence of deep venous thrombosis in either lower extremity. Electronically Signed   By: Aletta Edouard M.D.   On: 10/08/2019 10:27   US ARTERIAL ABI (SCREENING LOWER EXTREMITY)  Result Date: 10/29/2019 CLINICAL DATA:  Cool right foot. EXAM: NONINVASIVE PHYSIOLOGIC VASCULAR STUDY OF BILATERAL LOWER EXTREMITIES TECHNIQUE: Evaluation of both lower extremities were performed at rest, including calculation of ankle-brachial indices with single level Doppler, pressure and pulse volume recording. COMPARISON:  None. FINDINGS: Right ABI:  1.13 Left ABI:  1.32 Right Lower Extremity: Normal triphasic Doppler waveform in the right posterior tibial artery. Irregular waveforms at the right dorsalis pedis artery. Left Lower Extremity: Normal triphasic waveforms at the left posterior tibial artery. Irregular waveforms at the left dorsalis pedis  artery. IMPRESSION: Normal resting ankle-brachial indices bilaterally. Irregular doppler waveforms at the dorsalis pedis arteries bilaterally. Electronically Signed   By: Markus Daft M.D.   On: 10/29/2019 12:49   DG Chest Portable 1 View  Result Date: 10/22/2019 CLINICAL DATA:  Chest pain relieved with nitroglycerin EXAM: PORTABLE CHEST 1 VIEW COMPARISON:  10/08/2019, 10/11/2019 FINDINGS: Single frontal view of the chest demonstrates stable enlargement of the cardiac silhouette. Continued ectasia of the thoracic aorta. Ventriculostomy catheter overlies right chest. No airspace disease, effusion, or pneumothorax. No acute bony abnormalities. IMPRESSION: 1. No acute intrathoracic process. Electronically Signed   By: Randa Ngo M.D.   On: 10/22/2019 16:25   DG CHEST PORT 1 VIEW  Result Date: 10/08/2019 CLINICAL DATA:  Dyspnea, COVID positive EXAM: PORTABLE CHEST 1 VIEW COMPARISON:  Chest radiograph from one day prior. FINDINGS: Intact appearing visualized right sided VP shunt. Partially visualized surgical fixation hardware in the lower cervical spine. Stable cardiomediastinal silhouette with top-normal heart size. No pneumothorax. No pleural effusion. Lungs appear clear, with no acute consolidative airspace disease and no pulmonary edema. IMPRESSION: No active disease. Electronically Signed   By: Ilona Sorrel M.D.   On: 10/08/2019 10:48   DG Chest Port 1 View  Result Date: 10/07/2019 CLINICAL DATA:  EMS reports wife called because pt unable to get up off of couch. Reports when they arrived pt was soaked in urine. Reports recently exposed to someone with covid. Reports cough for " a while." EMS reports fever 101.8. EXAM: PORTABLE CHEST - 1 VIEW COMPARISON:  07/29/2019 FINDINGS: Lungs are clear. Heart size upper limits normal for technique. Tortuous thoracic aorta. No effusion. No pneumothorax. Stable cervical fixation hardware. VP shunt tubing projects over the right hemithorax. IMPRESSION: No acute  disease. Electronically Signed   By: Lucrezia Europe M.D.   On: 10/07/2019 10:34  DG Abdomen Acute W/Chest  Result Date: 10/28/2019 CLINICAL DATA:  Fever and weakness EXAM: DG ABDOMEN ACUTE W/ 1V CHEST COMPARISON:  CT abdomen pelvis 04/26/2017 FINDINGS: Heart size and vascularity normal. Lungs are clear without infiltrate or effusion. VP shunt tubing in the right chest extending into the mid abdomen without discontinuity. Normal bowel gas pattern. No bowel obstruction ileus or free air. Left hip replacement. IMPRESSION: Negative abdominal radiographs.  No acute cardiopulmonary disease. Electronically Signed   By: Franchot Gallo M.D.   On: 10/28/2019 14:25   DG FLUORO GUIDED NEEDLE PLC ASPIRATION/INJECTION LOC  Result Date: 10/11/2019 CLINICAL DATA:  Fever, encephalopathy EXAM: DIAGNOSTIC LUMBAR PUNCTURE UNDER FLUOROSCOPIC GUIDANCE FLUOROSCOPY TIME:  Fluoroscopy Time:  0 minutes 6 seconds Radiation Exposure Index (if provided by the fluoroscopic device): 15.2 mGy Number of Acquired Spot Images: 1 PROCEDURE: Procedure, benefits, and risks were discussed with the patient, including alternatives. Patient's questions were answered. Written informed consent was obtained. Timeout protocol followed. Patient placed prone. L3-L4 disc space was localized under fluoroscopy. Skin prepped and draped in usual sterile fashion. Skin and soft tissues anesthetized with 3 mL of 1% lidocaine. 22 gauge needle was advanced into spinal canal where clear colorless CSF was encountered. 9 mL of CSF was obtained in 4 tubes for requested analysis. Procedure tolerated very well by patient without immediate complication. Opening pressure was not recorded due to patient limitations in cooperating for study. IMPRESSION: Successful fluoroscopically guided lumbar puncture as above. Electronically Signed   By: Lavonia Dana M.D.   On: 10/11/2019 14:27   US Abdomen Limited RUQ  Result Date: 10/10/2019 CLINICAL DATA:  Elevated liver enzymes.   Abdominal pain. EXAM: ULTRASOUND ABDOMEN LIMITED RIGHT UPPER QUADRANT COMPARISON:  CT, 04/26/2017. FINDINGS: Gallbladder: No gallstones or wall thickening visualized. No sonographic Murphy sign noted by sonographer. Common bile duct: Diameter: 3 mm Liver: Mild increased parenchymal echogenicity. Normal in size. No mass or focal lesion. Portal vein is patent on color Doppler imaging with normal direction of blood flow towards the liver. Other: None. IMPRESSION: 1. No acute findings.  Normal gallbladder.  No bile duct dilation. 2. Mild increased liver parenchymal echogenicity suggests hepatic steatosis. Electronically Signed   By: Lajean Manes M.D.   On: 10/10/2019 16:47    Michal Callicott Wynetta Emery, MD  How to contact the [facility] Attending or Consulting provider [place] or covering provider during after hours [place], for this patient?  1. Check the care team in [facility] and look for a) attending/consulting TRH provider listed and b) the [facility] team listed 2. Log into [email or web address] and use [place] universal password to access. If you do not have the password, please contact the hospital operator. 3. Locate the [facility] provider you are looking for under [facility] and page to a number that you can be directly reached. 4. If you still have difficulty reaching the provider, please page the [facility] (Director on Call) for the Hospitalists listed on amion for assistance.   If 7PM-7AM, please contact night-coverage [email or web address] Password [email or web address] 10/30/2019, 10:38 AM   LOS: 2 days

## 2019-10-30 NOTE — Progress Notes (Signed)
Physical Therapy Treatment Patient Details Name: Albert Hahn MRN: [id number] DOB: 1946-02-14 Today's Date: 10/30/2019    History of Present Illness Brittian Renaldo Geraci is a 74 y.o. male with medical history significant of recent hospitalizaton for covid 19 PNA. He was discharged to [facility] 2/2 weakness and assist with ADLs. He was discharged home Friday 10/25/19 in good condition, awake and alert. Starting Saturday he became weak and family had a difficult time providing assistance. He developed increased weakness and foul smelling urine. EMS was called and brought patient to [facility] for evaluation. Per wife when EMT's tried to move him he cried out with pain in his legs.    PT Comments    Pt required multimodal cueing for safety and mobility with bed mobility and transfer training.  Pt very slow and easily distracted, required cueing to stay on task.  Seated balance required HHA to prevent posterior lean.  Upon standing pt with bowel movement.  Wife stayed in room as therapist went to grab linens and NT to assist with cleaning.  Upon re-entry pt leaned to Lt due to fatigue, required mod A to sit.  Mod A for standing and increased time to take 2 steps to chair.  Pt left in chair with wife in room and chair alarm set, RN aware of status.      Follow Up Recommendations  SNF     Equipment Recommendations  None recommended by PT    Recommendations for Other Services       Precautions / Restrictions Precautions Precautions: Fall Restrictions Weight Bearing Restrictions: No    Mobility  Bed Mobility Overal bed mobility: Needs Assistance Bed Mobility: Supine to Sit;Rolling Rolling: Mod assist   Supine to sit: Mod assist     General bed mobility comments: slow labored movement, frequent verbal and tactile cueing for sequencing with limited carry over, mod/max assist to transition to seated EOB and assist to remain seated  Transfers Overall transfer level: Needs  assistance Equipment used: Rolling walker (2 wheeled) Transfers: Sit to/from [facility] Sit to Stand: Mod assist Stand pivot transfers: Mod assist       General transfer comment: Pt required constant cueing for hand placement and mechanics to assist wtih standing; Pt stood for 2 minutes with NT cleaned following bowel movement  Ambulation/Gait                 Stairs             Wheelchair Mobility    Modified Rankin (Stroke Patients Only)       Balance                                            Cognition Arousal/Alertness: Awake/alert;Lethargic Behavior During Therapy: Flat affect Overall Cognitive Status: No family/caregiver present to determine baseline cognitive functioning                                 General Comments: requires repeated verbal/tactile cueing to complete functional tasks      Exercises      General Comments        Pertinent Vitals/Pain Pain Assessment: No/denies pain    Home Living  Prior Function            PT Goals (current goals can now be found in the care plan section)      Frequency           PT Plan Current plan remains appropriate    Co-evaluation              AM-PAC PT "6 Clicks" Mobility   Outcome Measure  Help needed turning from your back to your side while in a flat bed without using bedrails?: A Lot Help needed moving from lying on your back to sitting on the side of a flat bed without using bedrails?: A Lot Help needed moving to and from a bed to a chair (including a wheelchair)?: A Lot Help needed standing up from a chair using your arms (e.g., wheelchair or bedside chair)?: A Lot Help needed to walk in hospital room?: A Lot Help needed climbing 3-5 steps with a railing? : Total 6 Click Score: 11    End of Session Equipment Utilized During Treatment: Gait belt Activity Tolerance: Patient tolerated  treatment well;Patient limited by fatigue Patient left: in chair;with call bell/phone within reach;with chair alarm set Nurse Communication: Mobility status PT Visit Diagnosis: Unsteadiness on feet (R26.81);Other abnormalities of gait and mobility (R26.89);Muscle weakness (generalized) (M62.81)     Time: 1282-0813 PT Time Calculation (min) (ACUTE ONLY): 40 min  Charges:  $Therapeutic Activity: 38-52 mins                     Ihor Austin, LPTA/CLT; CBIS [phone]  Aldona Lento 10/30/2019, 3:49 PM

## 2019-10-30 NOTE — Progress Notes (Signed)
Palliative: Mr. Petroni is lying quietly in bed.  He greets me making and mostly keeping eye contact as I enter.  He appears much improved from yesterday, but still looks to be chronically ill and frail.  His wife of 57 years, [name], is at bedside.  Mr. Rewerts is oriented x3 at this time, able to tell me his health history.  Mr. Burnham goes into a story about how he had normal pressure hydrocephalus and the physician who placed his shunt "made a mistake".  He tells me that he has had a stroke, and talks about being 100% disabled through the [place].  He is tearful as he talks about this.  We talked about his functional status.  [name] tells me that he had periods of falling prior to shunt placement, but that he was walking 1 mile daily prior to getting ill with Covid, although he was dragging his left foot somewhat.       We talked about his acute health problem of UTI.  We talked about the treatment plan.  I share that UTI is "complicated" in men and even though we are testing, it would not be surprising for Mr. Cissell to have recurrent UTIs now.  We talked about natural aging changes that occur in the body, changes with poor mobility.  I share that I discussed this so they know to be watchful.  Mr. Wilensky' improvements.    We talked about disposition plan.  Although Mr. Smeltzer continues to be weak, he and wife feel that they can care for him best at home.  [facility], along with home health.  We talked about CODE STATUS.  Mr. Harn is again tearful, but states he would never want life support.  Wife [name] states that even though she wants him to continue, she would not put him on life support.  Orders changed, [place] form completed.  Mr. and [name] would like for PMT to have a discussion with their daughters later today.  We plan on a follow-up at 1 PM.  I return later in the day.  Mr. Mamula is sitting up in the bed.  He greets me making and mostly keeping eye  contact.  Present today is his wife [name], his daughter [name], and his daughter [name] via telephone.  We talked in detail about Mr. Carmicheal acute and chronic health concerns.  We talked in detail about UTI, "complicated in men", the treatment plan.  We talk about selected labs, including but not limited to, creatinine and potassium.  We also talked about the treatment plan related to electrolyte imbalance.  Family shares that Mr. Disanti has been incontinent of urine for several years now.  We talked about strategies for reducing accidents, including but not limited to, toileting frequently, keeping urinal handy.  We talked about disposition.  At this point patient and wife would like for Mr. Andre to return home with home health/VA services.  Unfortunately Mrs. Kai continues to state that her husband will receive care from the [place] covered at 100%.  [name] states that she called the [place], and was told that they would have to evaluate Mr. Robotham before they could say how much in-home care they can provide.  Mrs. Tavares continues to request that I assist her in coordination of care.  I share that as the nurse practitioner, I am there to talk about the medical aspects of care, and goalsetting.  I continue to refer her  to transition of care team, they are aware of patient needs.  We talked about CODE STATUS.  [name] endorses DNR, family states that they can accept this decision.  We talked about the chronic illness pathway, what is normal and expected.  We talked about rehospitalizations, just like I will get sick again, [name] will also.  We talked about making this time look like and feel like what he wants.  [name] shares that he needs to use the bedpan, so Barrie Folk and I talk in the hallway.  Morgan Hill share that they have asked her mother to share planning and goals with them, but she has been reluctant to.  They share that she is very independent, and not willing to share goals in  decision-making.  I encourage [name] to lean on her children, they seem knowledgeable and ready to help.  Conference with attending, bedside nursing staff, transition of care team (detailed discussion related to home health needs/coverage) related to patient condition, needs, goals of care, disposition to home with home health/VA services.  Plan:   Home with home health/VA services.  Continue to treat the treatable but no CPR or intubation.  Rehospitalize as needed.  120 minutes, extended time Quinn Axe, NP Palliative Medicine Team Team Phone # [phone] Greater than 50% of this time was spent counseling and coordinating care related to the above assessment and plan.

## 2019-10-30 NOTE — Plan of Care (Signed)
  Problem: Education: Goal: Knowledge of General Education information will improve Description: Including pain rating scale, medication(s)/side effects and non-pharmacologic comfort measures Outcome: Progressing   Problem: Clinical Measurements: Goal: Will remain free from infection Outcome: Progressing   Problem: Pain Managment: Goal: General experience of comfort will improve Outcome: Progressing   Problem: Safety: Goal: Ability to remain free from injury will improve Outcome: Progressing   Problem: Skin Integrity: Goal: Risk for impaired skin integrity will decrease Outcome: Progressing   Problem: Urinary Elimination: Goal: Signs and symptoms of infection will decrease Outcome: Progressing

## 2019-10-30 NOTE — Progress Notes (Signed)
Patient unable to do the standing portion of orthostatic vitals.

## 2019-10-31 DIAGNOSIS — I739 Peripheral vascular disease, unspecified: Secondary | ICD-10-CM

## 2019-10-31 LAB — BASIC METABOLIC PANEL
Anion gap: 9 (ref 5–15)
BUN: 23 mg/dL (ref 8–23)
CO2: 21 mmol/L — ABNORMAL LOW (ref 22–32)
Calcium: 8.6 mg/dL — ABNORMAL LOW (ref 8.9–10.3)
Chloride: 105 mmol/L (ref 98–111)
Creatinine, Ser: 1.23 mg/dL (ref 0.61–1.24)
GFR calc Af Amer: >60 mL/min (ref >60)
GFR calc non Af Amer: 58 mL/min — ABNORMAL LOW (ref >60)
Glucose, Bld: 92 mg/dL (ref 70–99)
Potassium: 3 mmol/L — ABNORMAL LOW (ref 3.5–5.1)
Sodium: 135 mmol/L (ref 135–145)

## 2019-10-31 LAB — MAGNESIUM: Magnesium: 1.9 mg/dL (ref 1.7–2.4)

## 2019-10-31 MED ORDER — AMITRIPTYLINE HCL 50 MG PO TABS: 75.0000 mg | ORAL_TABLET | Freq: Every day | ORAL | Status: DC

## 2019-10-31 MED ORDER — POTASSIUM CHLORIDE 20 MEQ/15ML (10%) PO SOLN: 40.0000 meq | Freq: Every day | ORAL | 0 refills | Status: DC

## 2019-10-31 MED ORDER — POTASSIUM CHLORIDE 20 MEQ/15ML (10%) PO SOLN
40.0000 meq | Freq: Once | ORAL | Status: AC
  Administered 2019-10-31: 40 meq via ORAL
  Filled 2019-10-31: qty 30

## 2019-10-31 MED ORDER — DOCUSATE SODIUM 100 MG PO CAPS: 500.0000 mg | ORAL_CAPSULE | Freq: Every day | ORAL | 0 refills | Status: DC | PRN

## 2019-10-31 MED ORDER — HYDROCHLOROTHIAZIDE 25 MG PO TABS: 12.5000 mg | ORAL_TABLET | Freq: Every day | ORAL | Status: DC

## 2019-10-31 MED ORDER — CEFDINIR 300 MG PO CAPS: 600.0000 mg | ORAL_CAPSULE | Freq: Every day | ORAL | 0 refills | Status: AC

## 2019-10-31 NOTE — Discharge Instructions (Signed)
IMPORTANT INFORMATION: PAY CLOSE ATTENTION   PHYSICIAN DISCHARGE INSTRUCTIONS  Follow with Primary care provider  Fagan, Roy, MD  and other consultants as instructed by your Hospitalist Physician  SEEK MEDICAL CARE OR RETURN TO EMERGENCY ROOM IF SYMPTOMS COME BACK, WORSEN OR NEW PROBLEM DEVELOPS   Please note: You were cared for by a hospitalist during your hospital stay. Every effort will be made to forward records to your primary care provider.  You can request that your primary care provider send for your hospital records if they have not received them.  Once you are discharged, your primary care physician will handle any further medical issues. Please note that NO REFILLS for any discharge medications will be authorized once you are discharged, as it is imperative that you return to your primary care physician (or establish a relationship with a primary care physician if you do not have one) for your post hospital discharge needs so that they can reassess your need for medications and monitor your lab values.  Please get a complete blood count and chemistry panel checked by your Primary MD at your next visit, and again as instructed by your Primary MD.  Get Medicines reviewed and adjusted: Please take all your medications with you for your next visit with your Primary MD  Laboratory/radiological data: Please request your Primary MD to go over all hospital tests and procedure/radiological results at the follow up, please ask your primary care provider to get all Hospital records sent to his/her office.  In some cases, they will be blood work, cultures and biopsy results pending at the time of your discharge. Please request that your primary care provider follow up on these results.  If you are diabetic, please bring your blood sugar readings with you to your follow up appointment with primary care.    Please call and make your follow up appointments as soon as possible.    Also Note the  following: If you experience worsening of your admission symptoms, develop shortness of breath, life threatening emergency, suicidal or homicidal thoughts you must seek medical attention immediately by calling 911 or calling your MD immediately  if symptoms less severe.  You must read complete instructions/literature along with all the possible adverse reactions/side effects for all the Medicines you take and that have been prescribed to you. Take any new Medicines after you have completely understood and accpet all the possible adverse reactions/side effects.   Do not drive when taking Pain medications or sleeping medications (Benzodiazepines)  Do not take more than prescribed Pain, Sleep and Anxiety Medications. It is not advisable to combine anxiety,sleep and pain medications without talking with your primary care practitioner  Special Instructions: If you have smoked or chewed Tobacco  in the last 2 yrs please stop smoking, stop any regular Alcohol  and or any Recreational drug use.  Wear Seat belts while driving.  Do not drive if taking any narcotic, mind altering or controlled substances or recreational drugs or alcohol.       

## 2019-10-31 NOTE — Progress Notes (Cosign Needed)
Patient requires frequent re-positioning of the body in ways that cannot be achieved with an ordinary bed or wedge pillow, to eliminate pain, reduce pressure, and the head of the bed to be elevated more than 30 degrees most of the time due to Post COVID weakness, SIRS, Non Hodgkin's Lymphoma, acute hypokalemia.

## 2019-10-31 NOTE — TOC Transition Note (Addendum)
Transition of Care [facility]) - CM/SW Discharge Note   Patient Details  Name: Albert Hahn MRN: [id number] Date of Birth: Dec 11, 1945  Transition of [facility] CM/SW Contact:  Boneta Lucks, RN Phone Number: 10/31/2019, 10:59 AM   Clinical Narrative:  Patient discharging home today. Patient is active with [facility] for PT/OT/SLP. Santiago Glad updated with discharge plan.  Wife- had TOC confirm Home health would be covered by medicare and [facility].  [facility] confirmed coverage. Wife is concerned they will continue to need help or require more help than Home Health that has been ordered.  TOC was given C.Justin Mend SW number at  [phone] ext [phone] for wife to call after home health to see if patient will qualify for CNA. PCP will need to assist with orders. Wife updated.  [place] Calling TOC wanting a hospital bed. Ordered and called Juliann Pulse with  [facility]. RN updated they will try to deliver last this evening.    Final next level of care: Home w Home Health Services Barriers to Discharge: Barriers Resolved   Patient Goals and CMS Choice Patient states their goals for this hospitalization and ongoing recovery are:: spouse would like for him to get better and then to return home.   Choice offered to / list presented to : Spouse  Discharge Placement        Discharge Plan and Services In-house Referral: Clinical Social Work Discharge Planning Services: CM Consult               HH Arranged: PT, OT, Speech Therapy HH Agency: [facility]     Representative spoke with at [place]: Santiago Glad

## 2019-10-31 NOTE — Progress Notes (Cosign Needed)
Patient requires frequent re-positioning of the body in ways that cannot be achieved with an ordinary bed or wedge pillow, to eliminate pain, reduce pressure, and the head of the bed to be elevated more than 30 degrees most of the time due to post COVID weakness, Non-Hodgkin's Lymphoma, Acute Hypokalemia, SIRS, Acute encephalopathy, needing Palliative care.

## 2019-10-31 NOTE — Progress Notes (Signed)
Physical Therapy Treatment Patient Details Name: Albert Hahn MRN: [id number] DOB: 08/12/1946 Today's Date: 10/31/2019    History of Present Illness Albert Hahn is a 74 y.o. male with medical history significant of recent hospitalizaton for covid 19 PNA. He was discharged to [facility] 2/2 weakness and assist with ADLs. He was discharged home Friday 10/25/19 in good condition, awake and alert. Starting Saturday he became weak and family had a difficult time providing assistance. He developed increased weakness and foul smelling urine. EMS was called and brought [facility] to [facility] for evaluation. Per wife when EMT's tried to move him he cried out with pain in his legs.    PT Comments    Pt with need for bowel movement, SPT to [facility].  Pt required frequent mod/max verbal and tactile cueing to complete all bed mobility and transfer training.  Increased time to complete all tasks.  Pt stood for cleaning with max cueing for posture to prevent risk of fall.  EOS pt left in chair with call bell within reach, chair alarm set, wife and RN in room.  Pt high fall risk due to weakness and cognition difficult to assess as pt does joke a lot.  Reports increased Rt knee pain following standing, RN aware.    Follow Up Recommendations  SNF     Equipment Recommendations  None recommended by PT    Recommendations for Other Services       Precautions / Restrictions Restrictions Weight Bearing Restrictions: No    Mobility  Bed Mobility Overal bed mobility: Needs Assistance Bed Mobility: Supine to Sit;Rolling     Supine to sit: Mod assist     General bed mobility comments: slow labored movement, frequent verbal and tactile cueing for sequencing with limited carry over, mod/max assist to transition to seated EOB and required HHA with seated balance  Transfers Overall transfer level: Needs assistance Equipment used: Rolling walker (2 wheeled) Transfers: Stand Pivot Transfers Sit to Stand:  Mod assist;Max assist         General transfer comment: Pt required constant cueing for hand placement and mechanics to assist wtih standing;  Ambulation/Gait Ambulation/Gait assistance: Mod assist Gait Distance (Feet): 4 Feet Assistive device: Rolling walker (2 wheeled) Gait Pattern/deviations: Decreased step length - right;Decreased step length - left;Decreased stride length Gait velocity: decreased   General Gait Details: limited to 6-7 slow labored unsteady steps at bedside due to BLE weakness and fall risk   Stairs             Wheelchair Mobility    Modified Rankin (Stroke Patients Only)       Balance                                            Cognition Arousal/Alertness: Awake/alert;Lethargic Behavior During Therapy: Flat affect Overall Cognitive Status: No family/caregiver present to determine baseline cognitive functioning                                 General Comments: Pt stated he was in a [name], believes it is August, 2021.  Was able to state name and knows who is the president.  Pt required increased time to complete all tasks and required repeated verbal and tactile cueing to complete functional tasks.      Exercises  General Comments        Pertinent Vitals/Pain Pain Assessment: 0-10 Pain Score: 8  Pain Location: Rt knee pain Pain Descriptors / Indicators: Discomfort;Sore;Aching Pain Intervention(s): Monitored during session;Repositioned;Limited activity within patient's tolerance    Home Living                      Prior Function            PT Goals (current goals can now be found in the care plan section)      Frequency    Min 3X/week      PT Plan Current plan remains appropriate    Co-evaluation              AM-PAC PT "6 Clicks" Mobility   Outcome Measure  Help needed turning from your back to your side while in a flat bed without using bedrails?: A Lot Help needed  moving from lying on your back to sitting on the side of a flat bed without using bedrails?: A Lot Help needed moving to and from a bed to a chair (including a wheelchair)?: A Lot Help needed standing up from a chair using your arms (e.g., wheelchair or bedside chair)?: A Lot Help needed to walk in hospital room?: A Lot Help needed climbing 3-5 steps with a railing? : Total 6 Click Score: 11    End of Session Equipment Utilized During Treatment: Gait belt Activity Tolerance: Patient tolerated treatment well;Patient limited by fatigue;Patient limited by pain Patient left: in chair;with call bell/phone within reach;with chair alarm set;with family/visitor present;with nursing/sitter in room Nurse Communication: Mobility status PT Visit Diagnosis: Unsteadiness on feet (R26.81);Other abnormalities of gait and mobility (R26.89);Muscle weakness (generalized) (M62.81)     Time: 9381-8299 PT Time Calculation (min) (ACUTE ONLY): 47 min  Charges:  $Therapeutic Activity: 38-52 mins                    Ihor Austin, LPTA/CLT; CBIS [phone]  Aldona Lento 10/31/2019, 11:53 AM

## 2019-10-31 NOTE — Progress Notes (Signed)
Discussed AVS including medications & need for follow up appointments with the patient's wife and all questioned fully answered to the best of my ability. Patient's wife has additional questions about home care being provided by the [place]. Wife advised to call social worker assigned from [place] for what is covered with their benefits. Patient and wife notified a hospital bed has been ordered and will be delivered to their home.

## 2019-10-31 NOTE — Plan of Care (Signed)
  Problem: Education: Goal: Knowledge of General Education information will improve Description: Including pain rating scale, medication(s)/side effects and non-pharmacologic comfort measures Outcome: Progressing   Problem: Activity: Goal: Risk for activity intolerance will decrease Outcome: Not Progressing   Problem: Nutrition: Goal: Adequate nutrition will be maintained Outcome: Not Progressing

## 2019-10-31 NOTE — Discharge Summary (Signed)
Physician Discharge Summary  Albert Hahn [place] WEX:[id number] DOB: [birth year] DOA: 10/28/2019  PCP: Albert Noble, MD  Admit date: 10/28/2019 Discharge date: 10/31/2019  Admitted From:  Home   Disposition: Refuses SNF, Home with [facility]  Recommendations for Outpatient Follow-up:  1. Follow up with PCP in 1 weeks  Home Health: PT, OT, SLP  Discharge Condition: STABLE   CODE STATUS: DNR    Brief Hospitalization Summary: Please see all hospital notes, images, labs for full details of the hospitalization. ADMISSION HPI: Albert Hahn is a 74 y.o. male with medical history significant of recent hospitalizaton for covid 19 PNA. He was discharged to [facility] 2/2 weakness and assist with ADLs. He was discharged home Friday 10/25/19 in good condition, awake and alert. Starting Saturday he became weak and family had a difficult time providing assistance. He developed increased weakness and foul smelling urine. EMS was called and brought patient to [facility] for evaluation. Per wife when EMT's tried to move him he cried out with pain in his legs.  ED Course:  Patient was mildly hypotensive to 103/63 which improved with IV fluids; he was febrile to 101.7. U/A was positive. WBC 10.2 with normal diff. He was tachycardic and mildly tachypneic . Code sepsis was called. Patient was given initial dose of cefipime. TRH called to admit patient for continued treatment.  Brief History:  73 y.o.malewith past medical history relevant for history ofNHL,VP shunt for NPH, PTSD, HTN, with recent COVID-19 infection for which he was hospitalized 10/07/19-10/13/19.  He was discharged to [facility] on 10/13/19.  He was doing well and discharged home on 10/25/19.  Over the next 24-48 hours, family noted pt had gradual worsening of generalized weakness and confusion.  There was concern for UTI.  He was taken to UC on 10/26/19 where apparently a UA was obtained, but the results were unknown.  Nevertheless, the patient had worsening  generalized weakness requiring greater amount of assistance with transfers and increasing confusion.  As result, the patient was brought to emergency department for the evaluation.  There is no reports of chest pain, shortness breath, nausea, vomiting, diarrhea, syncope. In the emergency department, the patient had a temperature up to 101.7 F with tachycardia in the 110s.  He was hemodynamically stable with oxygen saturation 98% on room air.  UA showed 21-50 WBC.  Labs showed potassium 3.0, sodium 133, serum creatinine 122.  WBC was 10.2 with hemoglobin 12.7 and platelets 237,000.  The patient was admitted for sepsis secondary to UTI.  He was started on ceftriaxone.  Assessment/Plan: Sepsis - RESOLVED  -Present at the time of admission -Secondary to UTI -Patient presented with fever up to 101.7 F with tachycardia which is RESOLVED -Lactic acid peaked 1.5 but now normalized  UTI -UA 21-50 WBC, urine culture indeterminate -Continue ceftriaxone x 3 full doses, then stop  -Discharged home on 3 days of oral Omnicef.  Acute on chronic renal failure--CKD 3a -baseline creatinine 1.3-1.4 -serum creatinine peaked 1.82 -due to volume depletion and colchicine -Renal function has improved to normal.  Acute metabolic Encephalopathy -pt has underlying cognitive impairment -worsen due to infectious process and renal failure and hospital delirium -TSH--1.637 -[id number] -folate--15.1 -TSH--1.637 -remains pleasantly confused -MR brain cannot be obtained due to shunt incompatibility -DC restoril  COVID-19 Infection--recovered -no hypoxia  Cognitive Impairment -07/25/19 note from [place] states--longstanding left-sided weakness, with associated gait difficulty with 9 falls over the last few months -They also noted hx of irrational thoughts and cognitive changes -need  formal neuropsychiatric eval in future once stable  HTN -holding amlodipine due to soft BPs  Hypokalemia -replete -mag  1.9  Anxiety/Depression/PTSD -continue alprazolam, topiramate, Elavil at hs  Disposition Plan: Patient From: [place] [facility], HOME WITH [facility] 10/31/19 D/C Place: HOME WITH HH Barriers:   Family Communication:   Wife updated at bedside  Consultants:    Code Status:  DNR   DVT Prophylaxis:  enoxaparin Discharge Diagnoses:  Active Problems:   Acute hypokalemia   Acute metabolic encephalopathy   Acute renal failure superimposed on stage 3a chronic kidney disease (HCC)   Sepsis secondary to UTI ([place])   Acute UTI (urinary tract infection)   PAD (peripheral artery disease) ([place])   Sepsis due to undetermined organism ([place])   Goals of care, counseling/discussion   Palliative care by specialist   DNR (do not resuscitate) discussion   Discharge Instructions:  Allergies as of 10/31/2019      Reactions   Bee Venom Anaphylaxis   Shellfish Allergy Anaphylaxis   Codeine Nausea And Vomiting      Medication List    STOP taking these medications   dexamethasone 6 MG tablet Commonly known as: DECADRON   temazepam 15 MG capsule Commonly known as: RESTORIL     TAKE these medications   acetaminophen 500 MG tablet Commonly known as: TYLENOL Take 500-1,000 mg by mouth every 6 (six) hours as needed for mild pain or moderate pain.   allopurinol 100 MG tablet Commonly known as: ZYLOPRIM Take 100 mg by mouth daily.   amitriptyline 50 MG tablet Commonly known as: ELAVIL Take 1.5 tablets (75 mg total) by mouth at bedtime.   amLODipine 10 MG tablet Commonly known as: NORVASC Take 10 mg by mouth daily.   cefdinir 300 MG capsule Commonly known as: OMNICEF Take 2 capsules (600 mg total) by mouth daily for 3 days. Start taking on: November 01, 2019   docusate sodium 100 MG capsule Commonly known as: COLACE Take 5 capsules (500 mg total) by mouth daily as needed for mild constipation. What changed:   when to take this  reasons to take this   hydrochlorothiazide  25 MG tablet Commonly known as: HYDRODIURIL Take 0.5 tablets (12.5 mg total) by mouth daily. What changed: how much to take   Lubricant Eye Drops 0.4-0.3 % Soln Generic drug: Polyethyl Glycol-Propyl Glycol Place 1-2 drops into both eyes daily.   oxyCODONE 5 MG immediate release tablet Commonly known as: Oxy IR/ROXICODONE Take 1 tablet (5 mg total) by mouth every 6 (six) hours as needed (for pain.).   potassium chloride 20 MEQ/15ML (10%) Soln Take 30 mLs (40 mEq total) by mouth daily for 7 doses. Start taking on: November 01, 2019   terazosin 2 MG capsule Commonly known as: HYTRIN Take 4 mg by mouth at bedtime.   Topamax 100 MG tablet Generic drug: topiramate Take 100 mg by mouth daily. At bedtime      Follow-up Information    Albert Noble, MD. Schedule an appointment as soon as possible for a visit in 1 week(s).   Specialty: Internal Medicine Contact information: [address] [place] [address] [phone]        [facility], [place] Follow up.   Why:  PT/OT/SLP Contact information: [place] [address] [phone]          Allergies  Allergen Reactions  . Bee Venom Anaphylaxis  . Shellfish Allergy Anaphylaxis  . Codeine Nausea And Vomiting  Allergies as of 10/31/2019      Reactions   Bee Venom Anaphylaxis   Shellfish Allergy Anaphylaxis   Codeine Nausea And Vomiting      Medication List    STOP taking these medications   dexamethasone 6 MG tablet Commonly known as: DECADRON   temazepam 15 MG capsule Commonly known as: RESTORIL     TAKE these medications   acetaminophen 500 MG tablet Commonly known as: TYLENOL Take 500-1,000 mg by mouth every 6 (six) hours as needed for mild pain or moderate pain.   allopurinol 100 MG tablet Commonly known as: ZYLOPRIM Take 100 mg by mouth daily.   amitriptyline 50 MG tablet Commonly known as: ELAVIL Take 1.5 tablets (75 mg total) by mouth at bedtime.    amLODipine 10 MG tablet Commonly known as: NORVASC Take 10 mg by mouth daily.   cefdinir 300 MG capsule Commonly known as: OMNICEF Take 2 capsules (600 mg total) by mouth daily for 3 days. Start taking on: November 01, 2019   docusate sodium 100 MG capsule Commonly known as: COLACE Take 5 capsules (500 mg total) by mouth daily as needed for mild constipation. What changed:   when to take this  reasons to take this   hydrochlorothiazide 25 MG tablet Commonly known as: HYDRODIURIL Take 0.5 tablets (12.5 mg total) by mouth daily. What changed: how much to take   Lubricant Eye Drops 0.4-0.3 % Soln Generic drug: Polyethyl Glycol-Propyl Glycol Place 1-2 drops into both eyes daily.   oxyCODONE 5 MG immediate release tablet Commonly known as: Oxy IR/ROXICODONE Take 1 tablet (5 mg total) by mouth every 6 (six) hours as needed (for pain.).   potassium chloride 20 MEQ/15ML (10%) Soln Take 30 mLs (40 mEq total) by mouth daily for 7 doses. Start taking on: November 01, 2019   terazosin 2 MG capsule Commonly known as: HYTRIN Take 4 mg by mouth at bedtime.   Topamax 100 MG tablet Generic drug: topiramate Take 100 mg by mouth daily. At bedtime       Procedures/Studies: CT ABDOMEN PELVIS WO CONTRAST  Result Date: 10/11/2019 CLINICAL DATA:  Cough, dyspnea. COVID-19 positive. Abdominal distension. Remote history of lymphoma. EXAM: CT CHEST, ABDOMEN AND PELVIS WITHOUT CONTRAST TECHNIQUE: Multidetector CT imaging of the chest, abdomen and pelvis was performed following the standard protocol without IV contrast. COMPARISON:  CT AP 04/26/2017 in CT chest 12/22/2011. FINDINGS: CT CHEST FINDINGS Cardiovascular: The heart size is normal. Aortic atherosclerosis. Lad, left circumflex and RCA coronary artery calcifications. Mediastinum/Nodes: Normal appearance of the thyroid gland. The trachea appears patent and is midline. No enlarged mediastinal or hilar lymph nodes. No axillary or supraclavicular  adenopathy. Lungs/Pleura: Exam detail is diminished due to motion artifact. No pleural effusions. Patchy area of ground-glass attenuation is identified within the lateral left lower lobe, image 92/3. Mild bilateral peripheral areas of ground-glass attenuation overlying the right upper lobe posterior lower lobes and subpleural medial left upper lobe noted. No airspace consolidation, atelectasis or pneumothorax. Musculoskeletal: No chest wall mass or suspicious bone lesions identified. CT ABDOMEN PELVIS FINDINGS Hepatobiliary: No focal liver abnormality is seen. No gallstones, gallbladder wall thickening, or biliary dilatation. Pancreas: Unremarkable. No pancreatic ductal dilatation or surrounding inflammatory changes. Spleen: Normal in size without focal abnormality. Adrenals/Urinary Tract: Normal adrenal glands. No kidney mass or hydronephrosis. Urinary bladder normal. Stomach/Bowel: Small hiatal hernia. The stomach is nondistended. There are no dilated loops of small or large bowel. No bowel wall thickening, inflammation or distension. The appendix  is not confidently identified. Vascular/Lymphatic: Aortic atherosclerosis. No aneurysm. No abdominopelvic adenopathy. Reproductive: Prostate is unremarkable. Other: Fat containing left inguinal hernia noted. Right-sided ventriculoperitoneal shunt tube is identified which terminates in the upper abdomen. Musculoskeletal: Sclerotic appearing L3 vertebral body is again noted with changes compatible with previous kyphoplasty. IMPRESSION: 1. Bilateral peripheral predominant areas of ground-glass attenuation involving the upper and lower lung zones compatible with atypical pneumonia. No lobar consolidation, atelectasis or pneumothorax. 2. No acute findings within the abdomen or pelvis 3. Small hiatal hernia 4. Coronary artery calcifications Aortic Atherosclerosis (ICD10-I70.0). Electronically Signed   By: Kerby Moors M.D.   On: 10/11/2019 14:26   CT HEAD WO  CONTRAST  Result Date: 10/10/2019 CLINICAL DATA:  Encephalopathy EXAM: CT HEAD WITHOUT CONTRAST TECHNIQUE: Contiguous axial images were obtained from the base of the skull through the vertex without intravenous contrast. COMPARISON:  10/07/2019 FINDINGS: Brain: Right frontal approach shunt catheter is stable in position. Discontinued right parietal approach shunt catheter again noted. Ventricle caliber is stable. There is no acute intracranial hemorrhage or mass effect. There is no new loss of gray-white differentiation. Nonspecific primarily periventricular white matter hypoattenuation is unchanged. Vascular: No hyperdense vessel. Skull: Calvarium is unremarkable apart from burr holes. Sinuses/Orbits: No new finding. Chronic sphenoid sinusitis is again noted. Other: None. IMPRESSION: No acute abnormality. Stable appearance compared to 10/07/2019 examination. Electronically Signed   By: Macy Mis M.D.   On: 10/10/2019 17:12   CT Head Wo Contrast  Result Date: 10/07/2019 CLINICAL DATA:  Encephalopathy, VP shunt EXAM: CT HEAD WITHOUT CONTRAST TECHNIQUE: Contiguous axial images were obtained from the base of the skull through the vertex without intravenous contrast. COMPARISON:  01/06/2017 FINDINGS: Brain: Right frontal approach shunt catheter is new from the prior study and terminates near midline within the interhemispheric fissure. Right parietal approach catheter has been discontinued. Ventricle caliber is similar. There is no acute intracranial hemorrhage or mass effect. No new loss of gray differentiation. Nonspecific primarily periventricular white matter hypoattenuation. Vascular: No hyperdense vessel. Skull: Unremarkable apart from burr holes. Sinuses/Orbits: Patchy mucosal thickening with chronic sphenoid sinusitis. Other: Mastoid air cells are clear. IMPRESSION: Interval placement of right frontal approach shunt catheter. Similar enlargement of the third and lateral ventricles. No acute  intracranial hemorrhage, mass effect, or evidence of acute infarction. Electronically Signed   By: Macy Mis M.D.   On: 10/07/2019 11:16   CT CHEST WO CONTRAST  Result Date: 10/11/2019 CLINICAL DATA:  Cough, dyspnea. COVID-19 positive. Abdominal distension. Remote history of lymphoma. EXAM: CT CHEST, ABDOMEN AND PELVIS WITHOUT CONTRAST TECHNIQUE: Multidetector CT imaging of the chest, abdomen and pelvis was performed following the standard protocol without IV contrast. COMPARISON:  CT AP 04/26/2017 in CT chest 12/22/2011. FINDINGS: CT CHEST FINDINGS Cardiovascular: The heart size is normal. Aortic atherosclerosis. Lad, left circumflex and RCA coronary artery calcifications. Mediastinum/Nodes: Normal appearance of the thyroid gland. The trachea appears patent and is midline. No enlarged mediastinal or hilar lymph nodes. No axillary or supraclavicular adenopathy. Lungs/Pleura: Exam detail is diminished due to motion artifact. No pleural effusions. Patchy area of ground-glass attenuation is identified within the lateral left lower lobe, image 92/3. Mild bilateral peripheral areas of ground-glass attenuation overlying the right upper lobe posterior lower lobes and subpleural medial left upper lobe noted. No airspace consolidation, atelectasis or pneumothorax. Musculoskeletal: No chest wall mass or suspicious bone lesions identified. CT ABDOMEN PELVIS FINDINGS Hepatobiliary: No focal liver abnormality is seen. No gallstones, gallbladder wall thickening, or biliary dilatation. Pancreas: Unremarkable.  No pancreatic ductal dilatation or surrounding inflammatory changes. Spleen: Normal in size without focal abnormality. Adrenals/Urinary Tract: Normal adrenal glands. No kidney mass or hydronephrosis. Urinary bladder normal. Stomach/Bowel: Small hiatal hernia. The stomach is nondistended. There are no dilated loops of small or large bowel. No bowel wall thickening, inflammation or distension. The appendix is not  confidently identified. Vascular/Lymphatic: Aortic atherosclerosis. No aneurysm. No abdominopelvic adenopathy. Reproductive: Prostate is unremarkable. Other: Fat containing left inguinal hernia noted. Right-sided ventriculoperitoneal shunt tube is identified which terminates in the upper abdomen. Musculoskeletal: Sclerotic appearing L3 vertebral body is again noted with changes compatible with previous kyphoplasty. IMPRESSION: 1. Bilateral peripheral predominant areas of ground-glass attenuation involving the upper and lower lung zones compatible with atypical pneumonia. No lobar consolidation, atelectasis or pneumothorax. 2. No acute findings within the abdomen or pelvis 3. Small hiatal hernia 4. Coronary artery calcifications Aortic Atherosclerosis (ICD10-I70.0). Electronically Signed   By: Kerby Moors M.D.   On: 10/11/2019 14:26   CT Angio Chest PE W and/or Wo Contrast  Result Date: 10/22/2019 CLINICAL DATA:  PE suspected, high prob Chest pain. Diagnosed with COVID 2 weeks ago. EXAM: CT ANGIOGRAPHY CHEST WITH CONTRAST TECHNIQUE: Multidetector CT imaging of the chest was performed using the standard protocol during bolus administration of intravenous contrast. Multiplanar CT image reconstructions and MIPs were obtained to evaluate the vascular anatomy. CONTRAST:  139mL OMNIPAQUE IOHEXOL 350 MG/ML SOLN COMPARISON:  Chest CT 10/11/2019, radiograph earlier this day. FINDINGS: Cardiovascular: Motion limited evaluation for pulmonary embolus. Apices and basilar evaluation is particularly limited. Allowing for limitations, no discrete filling defects in the pulmonary arteries to suggest pulmonary embolus. Aortic atherosclerosis without aortic dissection. 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 Mild cardiomegaly with coronary artery calcifications. Mediastinum/Nodes: Small mediastinal lymph nodes, all subcentimeter, likely reactive. No enlarged hilar lymph nodes. No esophageal wall  thickening. Tiny hiatal hernia. No thyroid nodule. Lungs/Pleura: Breathing motion artifact limits assessment. Persistent but improved peripheral ground-glass opacities involving the right greater than left lung. Mild dependent atelectasis with mild pleural thickening, no discrete pleural effusion. Upper Abdomen: Calcified hepatic granuloma. No acute upper abdominal findings. Musculoskeletal: There are no acute or suspicious osseous abnormalities. Degenerative change in the spine. Postsurgical change in the lower cervical spine, partially included. Intra-articular bodies posterior to the right shoulder. Review of the MIP images confirms the above findings. IMPRESSION: 1. Motion limited exam. Allowing for motion limitations, no filling defects in the pulmonary arteries to suggest pulmonary embolus. 2. Persistent but improved peripheral ground-glass opacities in the right greater than left lung. Findings consistent with sequela of COVID pneumonia. Mild dependent atelectasis. 3. Cardiomegaly with coronary artery calcifications. 4. Aortic tortuosity with use warm aneurysmal dilatation of the ascending aorta, maximal dimension 4.2 cm. Recommend annual imaging followup by CTA or MRA. This recommendation follows 2010 ACCF/AHA/AATS/ACR/ASA/SCA/SCAI/SIR/STS/SVM Guidelines for the Diagnosis and Management of Patients with Thoracic Aortic Disease. Circulation. 2010; 121: W580-D983. Aortic aneurysm NOS (ICD10-I71.9) Aortic Atherosclerosis (ICD10-I70.0). Electronically Signed   By: Keith Rake M.D.   On: 10/22/2019 20:17   US Venous Img Lower Bilateral (DVT)  Result Date: 10/08/2019 CLINICAL DATA:  Concern for pulmonary embolism. Bilateral lower extremity edema. EXAM: BILATERAL LOWER EXTREMITY VENOUS DOPPLER ULTRASOUND TECHNIQUE: Gray-scale sonography with graded compression, as well as color Doppler and duplex ultrasound were performed to evaluate the lower extremity deep venous systems from the level of the common  femoral vein and including the common femoral, femoral, profunda femoral, popliteal and calf veins including the posterior  tibial, peroneal and gastrocnemius veins when visible. The superficial great saphenous vein was also interrogated. Spectral Doppler was utilized to evaluate flow at rest and with distal augmentation maneuvers in the common femoral, femoral and popliteal veins. COMPARISON:  None. FINDINGS: RIGHT LOWER EXTREMITY Common Femoral Vein: No evidence of thrombus. Normal compressibility, respiratory phasicity and response to augmentation. Saphenofemoral Junction: No evidence of thrombus. Normal compressibility and flow on color Doppler imaging. Profunda Femoral Vein: No evidence of thrombus. Normal compressibility and flow on color Doppler imaging. Femoral Vein: No evidence of thrombus. Normal compressibility, respiratory phasicity and response to augmentation. Popliteal Vein: No evidence of thrombus. Normal compressibility, respiratory phasicity and response to augmentation. Calf Veins: No evidence of thrombus. Normal compressibility and flow on color Doppler imaging. Superficial Great Saphenous Vein: No evidence of thrombus. Normal compressibility. Venous Reflux:  None. Other Findings: No evidence of superficial thrombophlebitis or abnormal fluid collection. LEFT LOWER EXTREMITY Common Femoral Vein: No evidence of thrombus. Normal compressibility, respiratory phasicity and response to augmentation. Saphenofemoral Junction: No evidence of thrombus. Normal compressibility and flow on color Doppler imaging. Profunda Femoral Vein: No evidence of thrombus. Normal compressibility and flow on color Doppler imaging. Femoral Vein: No evidence of thrombus. Normal compressibility, respiratory phasicity and response to augmentation. Popliteal Vein: No evidence of thrombus. Normal compressibility, respiratory phasicity and response to augmentation. Calf Veins: No evidence of thrombus. Normal compressibility and  flow on color Doppler imaging. Superficial Great Saphenous Vein: No evidence of thrombus. Normal compressibility. Venous Reflux:  None. Other Findings: No evidence of superficial thrombophlebitis or abnormal fluid collection. IMPRESSION: No evidence of deep venous thrombosis in either lower extremity. Electronically Signed   By: Aletta Edouard M.D.   On: 10/08/2019 10:27   US ARTERIAL ABI (SCREENING LOWER EXTREMITY)  Result Date: 10/29/2019 CLINICAL DATA:  Cool right foot. EXAM: NONINVASIVE PHYSIOLOGIC VASCULAR STUDY OF BILATERAL LOWER EXTREMITIES TECHNIQUE: Evaluation of both lower extremities were performed at rest, including calculation of ankle-brachial indices with single level Doppler, pressure and pulse volume recording. COMPARISON:  None. FINDINGS: Right ABI:  1.13 Left ABI:  1.32 Right Lower Extremity: Normal triphasic Doppler waveform in the right posterior tibial artery. Irregular waveforms at the right dorsalis pedis artery. Left Lower Extremity: Normal triphasic waveforms at the left posterior tibial artery. Irregular waveforms at the left dorsalis pedis artery. IMPRESSION: Normal resting ankle-brachial indices bilaterally. Irregular doppler waveforms at the dorsalis pedis arteries bilaterally. Electronically Signed   By: Markus Daft M.D.   On: 10/29/2019 12:49   DG Chest Portable 1 View  Result Date: 10/22/2019 CLINICAL DATA:  Chest pain relieved with nitroglycerin EXAM: PORTABLE CHEST 1 VIEW COMPARISON:  10/08/2019, 10/11/2019 FINDINGS: Single frontal view of the chest demonstrates stable enlargement of the cardiac silhouette. Continued ectasia of the thoracic aorta. Ventriculostomy catheter overlies right chest. No airspace disease, effusion, or pneumothorax. No acute bony abnormalities. IMPRESSION: 1. No acute intrathoracic process. Electronically Signed   By: Randa Ngo M.D.   On: 10/22/2019 16:25   DG CHEST PORT 1 VIEW  Result Date: 10/08/2019 CLINICAL DATA:  Dyspnea, COVID positive  EXAM: PORTABLE CHEST 1 VIEW COMPARISON:  Chest radiograph from one day prior. FINDINGS: Intact appearing visualized right sided VP shunt. Partially visualized surgical fixation hardware in the lower cervical spine. Stable cardiomediastinal silhouette with top-normal heart size. No pneumothorax. No pleural effusion. Lungs appear clear, with no acute consolidative airspace disease and no pulmonary edema. IMPRESSION: No active disease. Electronically Signed   By: Janina Mayo.D.  On: 10/08/2019 10:48   DG Chest Port 1 View  Result Date: 10/07/2019 CLINICAL DATA:  EMS reports wife called because pt unable to get up off of couch. Reports when they arrived pt was soaked in urine. Reports recently exposed to someone with covid. Reports cough for " a while." EMS reports fever 101.8. EXAM: PORTABLE CHEST - 1 VIEW COMPARISON:  07/29/2019 FINDINGS: Lungs are clear. Heart size upper limits normal for technique. Tortuous thoracic aorta. No effusion. No pneumothorax. Stable cervical fixation hardware. VP shunt tubing projects over the right hemithorax. IMPRESSION: No acute disease. Electronically Signed   By: Lucrezia Europe M.D.   On: 10/07/2019 10:34   DG Abdomen Acute W/Chest  Result Date: 10/28/2019 CLINICAL DATA:  Fever and weakness EXAM: DG ABDOMEN ACUTE W/ 1V CHEST COMPARISON:  CT abdomen pelvis 04/26/2017 FINDINGS: Heart size and vascularity normal. Lungs are clear without infiltrate or effusion. VP shunt tubing in the right chest extending into the mid abdomen without discontinuity. Normal bowel gas pattern. No bowel obstruction ileus or free air. Left hip replacement. IMPRESSION: Negative abdominal radiographs.  No acute cardiopulmonary disease. Electronically Signed   By: Franchot Gallo M.D.   On: 10/28/2019 14:25   DG FLUORO GUIDED NEEDLE PLC ASPIRATION/INJECTION LOC  Result Date: 10/11/2019 CLINICAL DATA:  Fever, encephalopathy EXAM: DIAGNOSTIC LUMBAR PUNCTURE UNDER FLUOROSCOPIC GUIDANCE FLUOROSCOPY TIME:   Fluoroscopy Time:  0 minutes 6 seconds Radiation Exposure Index (if provided by the fluoroscopic device): 15.2 mGy Number of Acquired Spot Images: 1 PROCEDURE: Procedure, benefits, and risks were discussed with the patient, including alternatives. Patient's questions were answered. Written informed consent was obtained. Timeout protocol followed. Patient placed prone. L3-L4 disc space was localized under fluoroscopy. Skin prepped and draped in usual sterile fashion. Skin and soft tissues anesthetized with 3 mL of 1% lidocaine. 22 gauge needle was advanced into spinal canal where clear colorless CSF was encountered. 9 mL of CSF was obtained in 4 tubes for requested analysis. Procedure tolerated very well by patient without immediate complication. Opening pressure was not recorded due to patient limitations in cooperating for study. IMPRESSION: Successful fluoroscopically guided lumbar puncture as above. Electronically Signed   By: Lavonia Dana M.D.   On: 10/11/2019 14:27   US Abdomen Limited RUQ  Result Date: 10/10/2019 CLINICAL DATA:  Elevated liver enzymes.  Abdominal pain. EXAM: ULTRASOUND ABDOMEN LIMITED RIGHT UPPER QUADRANT COMPARISON:  CT, 04/26/2017. FINDINGS: Gallbladder: No gallstones or wall thickening visualized. No sonographic Murphy sign noted by sonographer. Common bile duct: Diameter: 3 mm Liver: Mild increased parenchymal echogenicity. Normal in size. No mass or focal lesion. Portal vein is patent on color Doppler imaging with normal direction of blood flow towards the liver. Other: None. IMPRESSION: 1. No acute findings.  Normal gallbladder.  No bile duct dilation. 2. Mild increased liver parenchymal echogenicity suggests hepatic steatosis. Electronically Signed   By: Lajean Manes M.D.   On: 10/10/2019 16:47     Subjective: Patient says he is feeling much better today.  He denies chest pain and shortness of breath.  He is alert and oriented and eating breakfast.  He is agreeable to going home  but still resistant to SNF placement.  Discharge Exam: Vitals:   10/30/19 1945 10/31/19 0531  BP: (!) 135/114 133/80  Pulse: 88 83  Resp: 16 18  Temp: 98.6 F (37 C) 97.6 F (36.4 C)  SpO2: 98% 96%   Vitals:   10/30/19 1432 10/30/19 1940 10/30/19 1945 10/31/19 0531  BP: 113/64  Marland Kitchen)  135/114 133/80  Pulse: 90  88 83  Resp: 16  16 18   Temp: 97.9 F (36.6 C)  98.6 F (37 C) 97.6 F (36.4 C)  TempSrc: Oral  Oral Oral  SpO2: 100% 98% 98% 96%  Weight:      Height:       General: Pt is alert, awake, not in acute distress Cardiovascular: RRR, S1/S2 +, no rubs, no gallops Respiratory: CTA bilaterally, no wheezing, no rhonchi Abdominal: Soft, NT, ND, bowel sounds + Extremities: no edema, no cyanosis Neurological: nonfocal exam.    The results of significant diagnostics from this hospitalization (including imaging, microbiology, ancillary and laboratory) are listed below for reference.     Microbiology: Recent Results (from the past 240 hour(s))  Urine culture     Status: Abnormal   Collection Time: 10/28/19 12:42 PM   Specimen: In/Out Cath Urine  Result Value Ref Range Status   Specimen Description   Final    IN/OUT CATH URINE Performed at [facility], [address] [place], [place] [address]    Special Requests   Final    NONE Performed at [facility], [address], [place], [place] [address]    Culture MULTIPLE SPECIES PRESENT, SUGGEST RECOLLECTION (A)  Final   Report Status 10/29/2019 FINAL  Final  Blood Culture (routine x 2)     Status: None (Preliminary result)   Collection Time: 10/28/19 12:58 PM   Specimen: Right Antecubital; Blood  Result Value Ref Range Status   Specimen Description   Final    RIGHT ANTECUBITAL BOTTLES DRAWN AEROBIC AND ANAEROBIC   Special Requests Blood Culture adequate volume  Final   Culture   Final    NO GROWTH 3 DAYS Performed at [facility], [address] [place], [place] [address]    Report Status PENDING  Incomplete  Blood  Culture (routine x 2)     Status: None (Preliminary result)   Collection Time: 10/28/19  1:12 PM   Specimen: Left Antecubital; Blood  Result Value Ref Range Status   Specimen Description   Final    LEFT ANTECUBITAL BOTTLES DRAWN AEROBIC AND ANAEROBIC   Special Requests Blood Culture adequate volume  Final   Culture   Final    NO GROWTH 3 DAYS Performed at [facility], [address] [place], [place] [address]    Report Status PENDING  Incomplete  MRSA PCR Screening     Status: None   Collection Time: 10/29/19 12:41 AM   Specimen: Nasopharyngeal  Result Value Ref Range Status   MRSA by PCR NEGATIVE NEGATIVE Final    Comment:        The GeneXpert MRSA Assay (FDA approved for NASAL specimens only), is one component of a comprehensive MRSA colonization surveillance program. It is not intended to diagnose MRSA infection nor to guide or monitor treatment for MRSA infections. Performed at [facility], [address] [place], [place] [address]      Labs: BNP (last 3 results) No results for input(s): BNP in the last 8760 hours. Basic Metabolic Panel: Recent Labs  Lab 10/28/19 1258 10/29/19 0454 10/30/19 0455 10/31/19 0547  NA 133* 135 134* 135  K 3.0* 3.0* 3.1* 3.0*  CL 100 102 103 105  CO2 20* 21* 22 21*  GLUCOSE 108* 104* 98 92  BUN 27* 26* 25* 23  CREATININE 1.82* 1.69* 1.43* 1.23  CALCIUM 8.5* 8.4* 8.2* 8.6*  MG  --  1.9 1.9 1.9   Liver Function Tests: Recent  Labs  Lab 10/28/19 1258  AST 55*  ALT 61*  ALKPHOS 126  BILITOT 0.5  PROT 7.3  ALBUMIN 2.7*   No results for input(s): LIPASE, AMYLASE in the last 168 hours. No results for input(s): AMMONIA in the last 168 hours. CBC: Recent Labs  Lab 10/28/19 1258 10/29/19 0454 10/30/19 0455  WBC 10.2 10.5 8.1  NEUTROABS 8.3*  --   --   HGB 12.7* 11.3* 10.7*  HCT 37.8* 34.6* 32.2*  MCV 95.2 96.1 96.1  PLT 237 213 212   Cardiac Enzymes: No results for input(s): CKTOTAL, CKMB, CKMBINDEX, TROPONINI in the  last 168 hours. BNP: Invalid input(s): POCBNP CBG: Recent Labs  Lab 10/29/19 0744 10/29/19 1125 10/29/19 1629  GLUCAP 92 125* 122*   D-Dimer No results for input(s): DDIMER in the last 72 hours. Hgb A1c No results for input(s): HGBA1C in the last 72 hours. Lipid Profile No results for input(s): CHOL, HDL, LDLCALC, TRIG, CHOLHDL, LDLDIRECT in the last 72 hours. Thyroid function studies No results for input(s): TSH, T4TOTAL, T3FREE, THYROIDAB in the last 72 hours.  Invalid input(s): FREET3 Anemia work up No results for input(s): VITAMINB12, FOLATE, FERRITIN, TIBC, IRON, RETICCTPCT in the last 72 hours. Urinalysis    Component Value Date/Time   COLORURINE AMBER (A) 10/28/2019 1242   APPEARANCEUR HAZY (A) 10/28/2019 1242   LABSPEC 1.013 10/28/2019 1242   LABSPEC 1.025 09/06/2006 1104   PHURINE 7.0 10/28/2019 1242   GLUCOSEU NEGATIVE 10/28/2019 1242   HGBUR MODERATE (A) 10/28/2019 1242   BILIRUBINUR NEGATIVE 10/28/2019 1242   BILIRUBINUR Negative 09/06/2006 1104   KETONESUR NEGATIVE 10/28/2019 1242   PROTEINUR 100 (A) 10/28/2019 1242   UROBILINOGEN 1.0 08/20/2008 1340   NITRITE POSITIVE (A) 10/28/2019 1242   LEUKOCYTESUR LARGE (A) 10/28/2019 1242   LEUKOCYTESUR Trace 09/06/2006 1104   Sepsis Labs Invalid input(s): PROCALCITONIN,  WBC,  LACTICIDVEN Microbiology Recent Results (from the past 240 hour(s))  Urine culture     Status: Abnormal   Collection Time: 10/28/19 12:42 PM   Specimen: In/Out Cath Urine  Result Value Ref Range Status   Specimen Description   Final    IN/OUT CATH URINE Performed at [facility], [address], [place], [place] [address]    Special Requests   Final    NONE Performed at [facility], [address], [place], [place] [address]    Culture MULTIPLE SPECIES PRESENT, SUGGEST RECOLLECTION (A)  Final   Report Status 10/29/2019 FINAL  Final  Blood Culture (routine x 2)     Status: None (Preliminary result)   Collection Time: 10/28/19 12:58  PM   Specimen: Right Antecubital; Blood  Result Value Ref Range Status   Specimen Description   Final    RIGHT ANTECUBITAL BOTTLES DRAWN AEROBIC AND ANAEROBIC   Special Requests Blood Culture adequate volume  Final   Culture   Final    NO GROWTH 3 DAYS Performed at [facility], [address] [place], [place] [address]    Report Status PENDING  Incomplete  Blood Culture (routine x 2)     Status: None (Preliminary result)   Collection Time: 10/28/19  1:12 PM   Specimen: Left Antecubital; Blood  Result Value Ref Range Status   Specimen Description   Final    LEFT ANTECUBITAL BOTTLES DRAWN AEROBIC AND ANAEROBIC   Special Requests Blood Culture adequate volume  Final   Culture   Final    NO GROWTH 3 DAYS Performed at [facility], [place]  [address], [place], [place] [address]    Report Status PENDING  Incomplete  MRSA PCR Screening     Status: None   Collection Time: 10/29/19 12:41 AM   Specimen: Nasopharyngeal  Result Value Ref Range Status   MRSA by PCR NEGATIVE NEGATIVE Final    Comment:        The GeneXpert MRSA Assay (FDA approved for NASAL specimens only), is one component of a comprehensive MRSA colonization surveillance program. It is not intended to diagnose MRSA infection nor to guide or monitor treatment for MRSA infections. Performed at [facility], [address], [place], [place] [address]    Time coordinating discharge: 31 mins  SIGNED:  Irwin Brakeman, MD  [facility] 10/31/2019, 11:01 AM How to contact the [facility] Attending or Consulting provider [place] or covering provider during after hours [place], for this patient?  1. Check the care team in [facility] and look for a) attending/consulting [facility] provider listed and b) the [facility] team listed 2. Log into [email or web address] and use [place]'s universal password to access. If you do not have the password, please contact the hospital operator. 3. Locate the [facility] provider you are looking for under [facility] and  page to a number that you can be directly reached. 4. If you still have difficulty reaching the provider, please page the [facility] (Director on Call) for the Hospitalists listed on amion for assistance.

## 2019-11-01 DIAGNOSIS — I739 Peripheral vascular disease, unspecified: Secondary | ICD-10-CM | POA: Diagnosis not present

## 2019-11-02 LAB — CULTURE, BLOOD (ROUTINE X 2)
Culture: NO GROWTH
Culture: NO GROWTH
Special Requests: ADEQUATE
Special Requests: ADEQUATE

## 2019-11-04 MED ORDER — CHOLECALCIFEROL 1.25 MG (50000 UT) PO CAPS
50000.00 | ORAL_CAPSULE | ORAL | Status: DC
Start: 2019-11-10 — End: 2019-11-04

## 2019-11-04 MED ORDER — WH PETROL-MINERAL OIL-LANOLIN 0.1-0.1 % OP OINT
TOPICAL_OINTMENT | OPHTHALMIC | Status: DC
Start: 2019-11-04 — End: 2019-11-04

## 2019-11-04 MED ORDER — HYDROCHLOROTHIAZIDE 25 MG PO TABS
12.50 | ORAL_TABLET | ORAL | Status: DC
Start: 2019-11-05 — End: 2019-11-04

## 2019-11-04 MED ORDER — ALLOPURINOL 100 MG PO TABS
100.00 | ORAL_TABLET | ORAL | Status: DC
Start: 2019-11-05 — End: 2019-11-04

## 2019-11-04 MED ORDER — TERAZOSIN HCL 2 MG PO CAPS
4.00 | ORAL_CAPSULE | ORAL | Status: DC
Start: 2019-11-04 — End: 2019-11-04

## 2019-11-04 MED ORDER — HEPARIN SODIUM (PORCINE) 5000 UNIT/ML IJ SOLN
5000.00 | INTRAMUSCULAR | Status: DC
Start: 2019-11-04 — End: 2019-11-04

## 2019-11-04 MED ORDER — TOPIRAMATE 100 MG PO TABS
100.00 | ORAL_TABLET | ORAL | Status: DC
Start: 2019-11-04 — End: 2019-11-04

## 2019-11-04 MED ORDER — AMITRIPTYLINE HCL 75 MG PO TABS
75.00 | ORAL_TABLET | ORAL | Status: DC
Start: 2019-11-04 — End: 2019-11-04

## 2019-11-04 MED ORDER — VANCOMYCIN HCL 50 MG/ML PO SOLR
125.00 | ORAL | Status: DC
Start: 2019-11-04 — End: 2019-11-04

## 2019-11-04 MED ORDER — MELATONIN 3 MG PO TABS
3.00 | ORAL_TABLET | ORAL | Status: DC
Start: ? — End: 2019-11-04

## 2019-11-04 MED ORDER — AMLODIPINE BESYLATE 10 MG PO TABS
10.00 | ORAL_TABLET | ORAL | Status: DC
Start: 2019-11-05 — End: 2019-11-04

## 2019-11-04 MED ORDER — LIDOCAINE HCL 1 % IJ SOLN
0.50 | INTRAMUSCULAR | Status: DC
Start: ? — End: 2019-11-04

## 2019-11-07 DIAGNOSIS — E039 Hypothyroidism, unspecified: Secondary | ICD-10-CM | POA: Diagnosis not present

## 2019-11-07 DIAGNOSIS — R7401 Elevation of levels of liver transaminase levels: Secondary | ICD-10-CM | POA: Diagnosis not present

## 2019-11-07 DIAGNOSIS — M25561 Pain in right knee: Secondary | ICD-10-CM | POA: Diagnosis not present

## 2019-11-07 DIAGNOSIS — E559 Vitamin D deficiency, unspecified: Secondary | ICD-10-CM | POA: Diagnosis not present

## 2019-11-07 DIAGNOSIS — D649 Anemia, unspecified: Secondary | ICD-10-CM | POA: Diagnosis not present

## 2019-11-07 DIAGNOSIS — R2681 Unsteadiness on feet: Secondary | ICD-10-CM | POA: Diagnosis not present

## 2019-11-07 DIAGNOSIS — N183 Chronic kidney disease, stage 3 unspecified: Secondary | ICD-10-CM | POA: Diagnosis not present

## 2019-11-07 DIAGNOSIS — C8590 Non-Hodgkin lymphoma, unspecified, unspecified site: Secondary | ICD-10-CM | POA: Diagnosis not present

## 2019-11-07 DIAGNOSIS — M6281 Muscle weakness (generalized): Secondary | ICD-10-CM | POA: Diagnosis not present

## 2019-11-07 DIAGNOSIS — E872 Acidosis: Secondary | ICD-10-CM | POA: Diagnosis not present

## 2019-11-07 DIAGNOSIS — A0472 Enterocolitis due to Clostridium difficile, not specified as recurrent: Secondary | ICD-10-CM | POA: Diagnosis not present

## 2019-11-07 DIAGNOSIS — R609 Edema, unspecified: Secondary | ICD-10-CM | POA: Diagnosis not present

## 2019-11-07 DIAGNOSIS — R5381 Other malaise: Secondary | ICD-10-CM | POA: Diagnosis not present

## 2019-11-07 DIAGNOSIS — R531 Weakness: Secondary | ICD-10-CM | POA: Diagnosis not present

## 2019-11-07 DIAGNOSIS — G9340 Encephalopathy, unspecified: Secondary | ICD-10-CM | POA: Diagnosis not present

## 2019-11-07 DIAGNOSIS — I10 Essential (primary) hypertension: Secondary | ICD-10-CM | POA: Diagnosis not present

## 2019-11-07 DIAGNOSIS — Z9181 History of falling: Secondary | ICD-10-CM | POA: Diagnosis not present

## 2019-11-07 DIAGNOSIS — Z743 Need for continuous supervision: Secondary | ICD-10-CM | POA: Diagnosis not present

## 2019-11-07 DIAGNOSIS — D631 Anemia in chronic kidney disease: Secondary | ICD-10-CM | POA: Diagnosis not present

## 2019-11-07 DIAGNOSIS — M109 Gout, unspecified: Secondary | ICD-10-CM | POA: Diagnosis not present

## 2019-11-07 DIAGNOSIS — R404 Transient alteration of awareness: Secondary | ICD-10-CM | POA: Diagnosis not present

## 2019-11-07 DIAGNOSIS — Z982 Presence of cerebrospinal fluid drainage device: Secondary | ICD-10-CM | POA: Diagnosis not present

## 2019-11-07 DIAGNOSIS — R627 Adult failure to thrive: Secondary | ICD-10-CM | POA: Diagnosis not present

## 2019-11-07 DIAGNOSIS — G912 (Idiopathic) normal pressure hydrocephalus: Secondary | ICD-10-CM | POA: Diagnosis not present

## 2019-11-07 DIAGNOSIS — R911 Solitary pulmonary nodule: Secondary | ICD-10-CM | POA: Diagnosis not present

## 2019-11-07 DIAGNOSIS — R197 Diarrhea, unspecified: Secondary | ICD-10-CM | POA: Diagnosis not present

## 2019-11-07 DIAGNOSIS — R9389 Abnormal findings on diagnostic imaging of other specified body structures: Secondary | ICD-10-CM | POA: Diagnosis not present

## 2019-11-08 DIAGNOSIS — C8590 Non-Hodgkin lymphoma, unspecified, unspecified site: Secondary | ICD-10-CM | POA: Diagnosis not present

## 2019-11-08 DIAGNOSIS — R5381 Other malaise: Secondary | ICD-10-CM | POA: Diagnosis not present

## 2019-11-08 DIAGNOSIS — A0472 Enterocolitis due to Clostridium difficile, not specified as recurrent: Secondary | ICD-10-CM | POA: Diagnosis not present

## 2019-11-08 DIAGNOSIS — G912 (Idiopathic) normal pressure hydrocephalus: Secondary | ICD-10-CM | POA: Diagnosis not present

## 2019-11-08 LAB — FUNGUS CULTURE WITH STAIN

## 2019-11-08 LAB — FUNGUS CULTURE RESULT

## 2019-11-08 LAB — FUNGAL ORGANISM REFLEX

## 2019-11-19 DIAGNOSIS — A0472 Enterocolitis due to Clostridium difficile, not specified as recurrent: Secondary | ICD-10-CM | POA: Diagnosis not present

## 2019-11-19 DIAGNOSIS — R5381 Other malaise: Secondary | ICD-10-CM | POA: Diagnosis not present

## 2019-11-19 DIAGNOSIS — E039 Hypothyroidism, unspecified: Secondary | ICD-10-CM | POA: Diagnosis not present

## 2019-11-22 DIAGNOSIS — A0472 Enterocolitis due to Clostridium difficile, not specified as recurrent: Secondary | ICD-10-CM | POA: Diagnosis not present

## 2019-11-22 DIAGNOSIS — R5381 Other malaise: Secondary | ICD-10-CM | POA: Diagnosis not present

## 2019-11-22 DIAGNOSIS — G912 (Idiopathic) normal pressure hydrocephalus: Secondary | ICD-10-CM | POA: Diagnosis not present

## 2019-11-22 DIAGNOSIS — C8590 Non-Hodgkin lymphoma, unspecified, unspecified site: Secondary | ICD-10-CM | POA: Diagnosis not present

## 2019-11-27 DIAGNOSIS — R197 Diarrhea, unspecified: Secondary | ICD-10-CM | POA: Diagnosis not present

## 2019-11-28 DIAGNOSIS — C8590 Non-Hodgkin lymphoma, unspecified, unspecified site: Secondary | ICD-10-CM | POA: Diagnosis not present

## 2019-11-28 DIAGNOSIS — I129 Hypertensive chronic kidney disease with stage 1 through stage 4 chronic kidney disease, or unspecified chronic kidney disease: Secondary | ICD-10-CM | POA: Diagnosis not present

## 2019-11-28 DIAGNOSIS — G912 (Idiopathic) normal pressure hydrocephalus: Secondary | ICD-10-CM | POA: Diagnosis not present

## 2019-11-28 DIAGNOSIS — R911 Solitary pulmonary nodule: Secondary | ICD-10-CM | POA: Diagnosis not present

## 2019-11-28 DIAGNOSIS — N1831 Chronic kidney disease, stage 3a: Secondary | ICD-10-CM | POA: Diagnosis not present

## 2019-11-28 DIAGNOSIS — E559 Vitamin D deficiency, unspecified: Secondary | ICD-10-CM | POA: Diagnosis not present

## 2019-11-28 DIAGNOSIS — Z982 Presence of cerebrospinal fluid drainage device: Secondary | ICD-10-CM | POA: Diagnosis not present

## 2019-11-28 DIAGNOSIS — Z87440 Personal history of urinary (tract) infections: Secondary | ICD-10-CM | POA: Diagnosis not present

## 2019-11-28 DIAGNOSIS — E039 Hypothyroidism, unspecified: Secondary | ICD-10-CM | POA: Diagnosis not present

## 2019-11-28 DIAGNOSIS — D631 Anemia in chronic kidney disease: Secondary | ICD-10-CM | POA: Diagnosis not present

## 2019-11-28 DIAGNOSIS — I69811 Memory deficit following other cerebrovascular disease: Secondary | ICD-10-CM | POA: Diagnosis not present

## 2019-11-29 DIAGNOSIS — E039 Hypothyroidism, unspecified: Secondary | ICD-10-CM | POA: Diagnosis not present

## 2019-11-29 DIAGNOSIS — R911 Solitary pulmonary nodule: Secondary | ICD-10-CM | POA: Diagnosis not present

## 2019-11-29 DIAGNOSIS — D631 Anemia in chronic kidney disease: Secondary | ICD-10-CM | POA: Diagnosis not present

## 2019-11-29 DIAGNOSIS — C8590 Non-Hodgkin lymphoma, unspecified, unspecified site: Secondary | ICD-10-CM | POA: Diagnosis not present

## 2019-11-29 DIAGNOSIS — Z982 Presence of cerebrospinal fluid drainage device: Secondary | ICD-10-CM | POA: Diagnosis not present

## 2019-11-29 DIAGNOSIS — A0472 Enterocolitis due to Clostridium difficile, not specified as recurrent: Secondary | ICD-10-CM | POA: Diagnosis not present

## 2019-11-29 DIAGNOSIS — N1831 Chronic kidney disease, stage 3a: Secondary | ICD-10-CM | POA: Diagnosis not present

## 2019-11-29 DIAGNOSIS — G912 (Idiopathic) normal pressure hydrocephalus: Secondary | ICD-10-CM | POA: Diagnosis not present

## 2019-11-29 DIAGNOSIS — Z87440 Personal history of urinary (tract) infections: Secondary | ICD-10-CM | POA: Diagnosis not present

## 2019-11-29 DIAGNOSIS — E559 Vitamin D deficiency, unspecified: Secondary | ICD-10-CM | POA: Diagnosis not present

## 2019-11-29 DIAGNOSIS — I69811 Memory deficit following other cerebrovascular disease: Secondary | ICD-10-CM | POA: Diagnosis not present

## 2019-11-29 DIAGNOSIS — I129 Hypertensive chronic kidney disease with stage 1 through stage 4 chronic kidney disease, or unspecified chronic kidney disease: Secondary | ICD-10-CM | POA: Diagnosis not present

## 2019-12-02 DIAGNOSIS — I739 Peripheral vascular disease, unspecified: Secondary | ICD-10-CM | POA: Diagnosis not present

## 2019-12-03 DIAGNOSIS — I69811 Memory deficit following other cerebrovascular disease: Secondary | ICD-10-CM | POA: Diagnosis not present

## 2019-12-03 DIAGNOSIS — C8590 Non-Hodgkin lymphoma, unspecified, unspecified site: Secondary | ICD-10-CM | POA: Diagnosis not present

## 2019-12-03 DIAGNOSIS — I129 Hypertensive chronic kidney disease with stage 1 through stage 4 chronic kidney disease, or unspecified chronic kidney disease: Secondary | ICD-10-CM | POA: Diagnosis not present

## 2019-12-03 DIAGNOSIS — E559 Vitamin D deficiency, unspecified: Secondary | ICD-10-CM | POA: Diagnosis not present

## 2019-12-03 DIAGNOSIS — G912 (Idiopathic) normal pressure hydrocephalus: Secondary | ICD-10-CM | POA: Diagnosis not present

## 2019-12-03 DIAGNOSIS — E039 Hypothyroidism, unspecified: Secondary | ICD-10-CM | POA: Diagnosis not present

## 2019-12-03 DIAGNOSIS — Z87440 Personal history of urinary (tract) infections: Secondary | ICD-10-CM | POA: Diagnosis not present

## 2019-12-03 DIAGNOSIS — Z982 Presence of cerebrospinal fluid drainage device: Secondary | ICD-10-CM | POA: Diagnosis not present

## 2019-12-03 DIAGNOSIS — N1831 Chronic kidney disease, stage 3a: Secondary | ICD-10-CM | POA: Diagnosis not present

## 2019-12-03 DIAGNOSIS — R911 Solitary pulmonary nodule: Secondary | ICD-10-CM | POA: Diagnosis not present

## 2019-12-03 DIAGNOSIS — D631 Anemia in chronic kidney disease: Secondary | ICD-10-CM | POA: Diagnosis not present

## 2019-12-04 DIAGNOSIS — N1831 Chronic kidney disease, stage 3a: Secondary | ICD-10-CM | POA: Diagnosis not present

## 2019-12-04 DIAGNOSIS — E559 Vitamin D deficiency, unspecified: Secondary | ICD-10-CM | POA: Diagnosis not present

## 2019-12-04 DIAGNOSIS — G912 (Idiopathic) normal pressure hydrocephalus: Secondary | ICD-10-CM | POA: Diagnosis not present

## 2019-12-04 DIAGNOSIS — E039 Hypothyroidism, unspecified: Secondary | ICD-10-CM | POA: Diagnosis not present

## 2019-12-04 DIAGNOSIS — C8590 Non-Hodgkin lymphoma, unspecified, unspecified site: Secondary | ICD-10-CM | POA: Diagnosis not present

## 2019-12-04 DIAGNOSIS — I69811 Memory deficit following other cerebrovascular disease: Secondary | ICD-10-CM | POA: Diagnosis not present

## 2019-12-04 DIAGNOSIS — R911 Solitary pulmonary nodule: Secondary | ICD-10-CM | POA: Diagnosis not present

## 2019-12-04 DIAGNOSIS — D631 Anemia in chronic kidney disease: Secondary | ICD-10-CM | POA: Diagnosis not present

## 2019-12-04 DIAGNOSIS — Z87440 Personal history of urinary (tract) infections: Secondary | ICD-10-CM | POA: Diagnosis not present

## 2019-12-04 DIAGNOSIS — I129 Hypertensive chronic kidney disease with stage 1 through stage 4 chronic kidney disease, or unspecified chronic kidney disease: Secondary | ICD-10-CM | POA: Diagnosis not present

## 2019-12-04 DIAGNOSIS — Z982 Presence of cerebrospinal fluid drainage device: Secondary | ICD-10-CM | POA: Diagnosis not present

## 2019-12-05 DIAGNOSIS — N1831 Chronic kidney disease, stage 3a: Secondary | ICD-10-CM | POA: Diagnosis not present

## 2019-12-05 DIAGNOSIS — I69811 Memory deficit following other cerebrovascular disease: Secondary | ICD-10-CM | POA: Diagnosis not present

## 2019-12-05 DIAGNOSIS — D631 Anemia in chronic kidney disease: Secondary | ICD-10-CM | POA: Diagnosis not present

## 2019-12-05 DIAGNOSIS — G912 (Idiopathic) normal pressure hydrocephalus: Secondary | ICD-10-CM | POA: Diagnosis not present

## 2019-12-05 DIAGNOSIS — E039 Hypothyroidism, unspecified: Secondary | ICD-10-CM | POA: Diagnosis not present

## 2019-12-05 DIAGNOSIS — R911 Solitary pulmonary nodule: Secondary | ICD-10-CM | POA: Diagnosis not present

## 2019-12-05 DIAGNOSIS — I129 Hypertensive chronic kidney disease with stage 1 through stage 4 chronic kidney disease, or unspecified chronic kidney disease: Secondary | ICD-10-CM | POA: Diagnosis not present

## 2019-12-05 DIAGNOSIS — E559 Vitamin D deficiency, unspecified: Secondary | ICD-10-CM | POA: Diagnosis not present

## 2019-12-05 DIAGNOSIS — Z982 Presence of cerebrospinal fluid drainage device: Secondary | ICD-10-CM | POA: Diagnosis not present

## 2019-12-05 DIAGNOSIS — C8590 Non-Hodgkin lymphoma, unspecified, unspecified site: Secondary | ICD-10-CM | POA: Diagnosis not present

## 2019-12-05 DIAGNOSIS — Z87440 Personal history of urinary (tract) infections: Secondary | ICD-10-CM | POA: Diagnosis not present

## 2019-12-12 DIAGNOSIS — D631 Anemia in chronic kidney disease: Secondary | ICD-10-CM | POA: Diagnosis not present

## 2019-12-12 DIAGNOSIS — E039 Hypothyroidism, unspecified: Secondary | ICD-10-CM | POA: Diagnosis not present

## 2019-12-12 DIAGNOSIS — Z982 Presence of cerebrospinal fluid drainage device: Secondary | ICD-10-CM | POA: Diagnosis not present

## 2019-12-12 DIAGNOSIS — I129 Hypertensive chronic kidney disease with stage 1 through stage 4 chronic kidney disease, or unspecified chronic kidney disease: Secondary | ICD-10-CM | POA: Diagnosis not present

## 2019-12-12 DIAGNOSIS — Z87440 Personal history of urinary (tract) infections: Secondary | ICD-10-CM | POA: Diagnosis not present

## 2019-12-12 DIAGNOSIS — I69811 Memory deficit following other cerebrovascular disease: Secondary | ICD-10-CM | POA: Diagnosis not present

## 2019-12-12 DIAGNOSIS — N1831 Chronic kidney disease, stage 3a: Secondary | ICD-10-CM | POA: Diagnosis not present

## 2019-12-12 DIAGNOSIS — G912 (Idiopathic) normal pressure hydrocephalus: Secondary | ICD-10-CM | POA: Diagnosis not present

## 2019-12-12 DIAGNOSIS — R911 Solitary pulmonary nodule: Secondary | ICD-10-CM | POA: Diagnosis not present

## 2019-12-12 DIAGNOSIS — C8590 Non-Hodgkin lymphoma, unspecified, unspecified site: Secondary | ICD-10-CM | POA: Diagnosis not present

## 2019-12-12 DIAGNOSIS — E559 Vitamin D deficiency, unspecified: Secondary | ICD-10-CM | POA: Diagnosis not present

## 2019-12-13 DIAGNOSIS — I129 Hypertensive chronic kidney disease with stage 1 through stage 4 chronic kidney disease, or unspecified chronic kidney disease: Secondary | ICD-10-CM | POA: Diagnosis not present

## 2019-12-13 DIAGNOSIS — Z982 Presence of cerebrospinal fluid drainage device: Secondary | ICD-10-CM | POA: Diagnosis not present

## 2019-12-13 DIAGNOSIS — R911 Solitary pulmonary nodule: Secondary | ICD-10-CM | POA: Diagnosis not present

## 2019-12-13 DIAGNOSIS — G912 (Idiopathic) normal pressure hydrocephalus: Secondary | ICD-10-CM | POA: Diagnosis not present

## 2019-12-13 DIAGNOSIS — C8590 Non-Hodgkin lymphoma, unspecified, unspecified site: Secondary | ICD-10-CM | POA: Diagnosis not present

## 2019-12-13 DIAGNOSIS — N1831 Chronic kidney disease, stage 3a: Secondary | ICD-10-CM | POA: Diagnosis not present

## 2019-12-13 DIAGNOSIS — I69811 Memory deficit following other cerebrovascular disease: Secondary | ICD-10-CM | POA: Diagnosis not present

## 2019-12-13 DIAGNOSIS — E559 Vitamin D deficiency, unspecified: Secondary | ICD-10-CM | POA: Diagnosis not present

## 2019-12-13 DIAGNOSIS — E039 Hypothyroidism, unspecified: Secondary | ICD-10-CM | POA: Diagnosis not present

## 2019-12-13 DIAGNOSIS — Z87440 Personal history of urinary (tract) infections: Secondary | ICD-10-CM | POA: Diagnosis not present

## 2019-12-13 DIAGNOSIS — D631 Anemia in chronic kidney disease: Secondary | ICD-10-CM | POA: Diagnosis not present

## 2019-12-16 DIAGNOSIS — E039 Hypothyroidism, unspecified: Secondary | ICD-10-CM | POA: Diagnosis not present

## 2019-12-16 DIAGNOSIS — E559 Vitamin D deficiency, unspecified: Secondary | ICD-10-CM | POA: Diagnosis not present

## 2019-12-16 DIAGNOSIS — Z87440 Personal history of urinary (tract) infections: Secondary | ICD-10-CM | POA: Diagnosis not present

## 2019-12-16 DIAGNOSIS — Z982 Presence of cerebrospinal fluid drainage device: Secondary | ICD-10-CM | POA: Diagnosis not present

## 2019-12-16 DIAGNOSIS — N1831 Chronic kidney disease, stage 3a: Secondary | ICD-10-CM | POA: Diagnosis not present

## 2019-12-16 DIAGNOSIS — I129 Hypertensive chronic kidney disease with stage 1 through stage 4 chronic kidney disease, or unspecified chronic kidney disease: Secondary | ICD-10-CM | POA: Diagnosis not present

## 2019-12-16 DIAGNOSIS — R911 Solitary pulmonary nodule: Secondary | ICD-10-CM | POA: Diagnosis not present

## 2019-12-16 DIAGNOSIS — D631 Anemia in chronic kidney disease: Secondary | ICD-10-CM | POA: Diagnosis not present

## 2019-12-16 DIAGNOSIS — C8590 Non-Hodgkin lymphoma, unspecified, unspecified site: Secondary | ICD-10-CM | POA: Diagnosis not present

## 2019-12-16 DIAGNOSIS — G912 (Idiopathic) normal pressure hydrocephalus: Secondary | ICD-10-CM | POA: Diagnosis not present

## 2019-12-16 DIAGNOSIS — I69811 Memory deficit following other cerebrovascular disease: Secondary | ICD-10-CM | POA: Diagnosis not present

## 2019-12-17 DIAGNOSIS — R911 Solitary pulmonary nodule: Secondary | ICD-10-CM | POA: Diagnosis not present

## 2019-12-17 DIAGNOSIS — N1831 Chronic kidney disease, stage 3a: Secondary | ICD-10-CM | POA: Diagnosis not present

## 2019-12-17 DIAGNOSIS — E559 Vitamin D deficiency, unspecified: Secondary | ICD-10-CM | POA: Diagnosis not present

## 2019-12-17 DIAGNOSIS — Z87440 Personal history of urinary (tract) infections: Secondary | ICD-10-CM | POA: Diagnosis not present

## 2019-12-17 DIAGNOSIS — I129 Hypertensive chronic kidney disease with stage 1 through stage 4 chronic kidney disease, or unspecified chronic kidney disease: Secondary | ICD-10-CM | POA: Diagnosis not present

## 2019-12-17 DIAGNOSIS — E039 Hypothyroidism, unspecified: Secondary | ICD-10-CM | POA: Diagnosis not present

## 2019-12-17 DIAGNOSIS — Z982 Presence of cerebrospinal fluid drainage device: Secondary | ICD-10-CM | POA: Diagnosis not present

## 2019-12-17 DIAGNOSIS — I69811 Memory deficit following other cerebrovascular disease: Secondary | ICD-10-CM | POA: Diagnosis not present

## 2019-12-17 DIAGNOSIS — D631 Anemia in chronic kidney disease: Secondary | ICD-10-CM | POA: Diagnosis not present

## 2019-12-17 DIAGNOSIS — G912 (Idiopathic) normal pressure hydrocephalus: Secondary | ICD-10-CM | POA: Diagnosis not present

## 2019-12-17 DIAGNOSIS — C8590 Non-Hodgkin lymphoma, unspecified, unspecified site: Secondary | ICD-10-CM | POA: Diagnosis not present

## 2019-12-18 DIAGNOSIS — E039 Hypothyroidism, unspecified: Secondary | ICD-10-CM | POA: Diagnosis not present

## 2019-12-18 DIAGNOSIS — C8590 Non-Hodgkin lymphoma, unspecified, unspecified site: Secondary | ICD-10-CM | POA: Diagnosis not present

## 2019-12-18 DIAGNOSIS — I69811 Memory deficit following other cerebrovascular disease: Secondary | ICD-10-CM | POA: Diagnosis not present

## 2019-12-18 DIAGNOSIS — E559 Vitamin D deficiency, unspecified: Secondary | ICD-10-CM | POA: Diagnosis not present

## 2019-12-18 DIAGNOSIS — G912 (Idiopathic) normal pressure hydrocephalus: Secondary | ICD-10-CM | POA: Diagnosis not present

## 2019-12-18 DIAGNOSIS — Z982 Presence of cerebrospinal fluid drainage device: Secondary | ICD-10-CM | POA: Diagnosis not present

## 2019-12-18 DIAGNOSIS — R911 Solitary pulmonary nodule: Secondary | ICD-10-CM | POA: Diagnosis not present

## 2019-12-18 DIAGNOSIS — Z87440 Personal history of urinary (tract) infections: Secondary | ICD-10-CM | POA: Diagnosis not present

## 2019-12-18 DIAGNOSIS — I129 Hypertensive chronic kidney disease with stage 1 through stage 4 chronic kidney disease, or unspecified chronic kidney disease: Secondary | ICD-10-CM | POA: Diagnosis not present

## 2019-12-18 DIAGNOSIS — N1831 Chronic kidney disease, stage 3a: Secondary | ICD-10-CM | POA: Diagnosis not present

## 2019-12-18 DIAGNOSIS — D631 Anemia in chronic kidney disease: Secondary | ICD-10-CM | POA: Diagnosis not present

## 2019-12-19 DIAGNOSIS — Z982 Presence of cerebrospinal fluid drainage device: Secondary | ICD-10-CM | POA: Diagnosis not present

## 2019-12-19 DIAGNOSIS — C8590 Non-Hodgkin lymphoma, unspecified, unspecified site: Secondary | ICD-10-CM | POA: Diagnosis not present

## 2019-12-19 DIAGNOSIS — I69811 Memory deficit following other cerebrovascular disease: Secondary | ICD-10-CM | POA: Diagnosis not present

## 2019-12-19 DIAGNOSIS — G912 (Idiopathic) normal pressure hydrocephalus: Secondary | ICD-10-CM | POA: Diagnosis not present

## 2019-12-19 DIAGNOSIS — Z87440 Personal history of urinary (tract) infections: Secondary | ICD-10-CM | POA: Diagnosis not present

## 2019-12-19 DIAGNOSIS — E559 Vitamin D deficiency, unspecified: Secondary | ICD-10-CM | POA: Diagnosis not present

## 2019-12-19 DIAGNOSIS — D631 Anemia in chronic kidney disease: Secondary | ICD-10-CM | POA: Diagnosis not present

## 2019-12-19 DIAGNOSIS — E039 Hypothyroidism, unspecified: Secondary | ICD-10-CM | POA: Diagnosis not present

## 2019-12-19 DIAGNOSIS — I129 Hypertensive chronic kidney disease with stage 1 through stage 4 chronic kidney disease, or unspecified chronic kidney disease: Secondary | ICD-10-CM | POA: Diagnosis not present

## 2019-12-19 DIAGNOSIS — N1831 Chronic kidney disease, stage 3a: Secondary | ICD-10-CM | POA: Diagnosis not present

## 2019-12-19 DIAGNOSIS — R911 Solitary pulmonary nodule: Secondary | ICD-10-CM | POA: Diagnosis not present

## 2019-12-26 DIAGNOSIS — Z982 Presence of cerebrospinal fluid drainage device: Secondary | ICD-10-CM | POA: Diagnosis not present

## 2019-12-26 DIAGNOSIS — E876 Hypokalemia: Secondary | ICD-10-CM | POA: Diagnosis not present

## 2019-12-26 DIAGNOSIS — I129 Hypertensive chronic kidney disease with stage 1 through stage 4 chronic kidney disease, or unspecified chronic kidney disease: Secondary | ICD-10-CM | POA: Diagnosis not present

## 2019-12-26 DIAGNOSIS — R911 Solitary pulmonary nodule: Secondary | ICD-10-CM | POA: Diagnosis not present

## 2019-12-26 DIAGNOSIS — E039 Hypothyroidism, unspecified: Secondary | ICD-10-CM | POA: Diagnosis not present

## 2019-12-26 DIAGNOSIS — Z87440 Personal history of urinary (tract) infections: Secondary | ICD-10-CM | POA: Diagnosis not present

## 2019-12-26 DIAGNOSIS — C8590 Non-Hodgkin lymphoma, unspecified, unspecified site: Secondary | ICD-10-CM | POA: Diagnosis not present

## 2019-12-26 DIAGNOSIS — E559 Vitamin D deficiency, unspecified: Secondary | ICD-10-CM | POA: Diagnosis not present

## 2019-12-26 DIAGNOSIS — D631 Anemia in chronic kidney disease: Secondary | ICD-10-CM | POA: Diagnosis not present

## 2019-12-26 DIAGNOSIS — G912 (Idiopathic) normal pressure hydrocephalus: Secondary | ICD-10-CM | POA: Diagnosis not present

## 2019-12-26 DIAGNOSIS — I69811 Memory deficit following other cerebrovascular disease: Secondary | ICD-10-CM | POA: Diagnosis not present

## 2019-12-26 DIAGNOSIS — N1831 Chronic kidney disease, stage 3a: Secondary | ICD-10-CM | POA: Diagnosis not present

## 2019-12-27 DIAGNOSIS — R911 Solitary pulmonary nodule: Secondary | ICD-10-CM | POA: Diagnosis not present

## 2019-12-27 DIAGNOSIS — D631 Anemia in chronic kidney disease: Secondary | ICD-10-CM | POA: Diagnosis not present

## 2019-12-27 DIAGNOSIS — N1831 Chronic kidney disease, stage 3a: Secondary | ICD-10-CM | POA: Diagnosis not present

## 2019-12-27 DIAGNOSIS — I69811 Memory deficit following other cerebrovascular disease: Secondary | ICD-10-CM | POA: Diagnosis not present

## 2019-12-27 DIAGNOSIS — Z87440 Personal history of urinary (tract) infections: Secondary | ICD-10-CM | POA: Diagnosis not present

## 2019-12-27 DIAGNOSIS — G912 (Idiopathic) normal pressure hydrocephalus: Secondary | ICD-10-CM | POA: Diagnosis not present

## 2019-12-27 DIAGNOSIS — Z982 Presence of cerebrospinal fluid drainage device: Secondary | ICD-10-CM | POA: Diagnosis not present

## 2019-12-27 DIAGNOSIS — C8590 Non-Hodgkin lymphoma, unspecified, unspecified site: Secondary | ICD-10-CM | POA: Diagnosis not present

## 2019-12-27 DIAGNOSIS — I129 Hypertensive chronic kidney disease with stage 1 through stage 4 chronic kidney disease, or unspecified chronic kidney disease: Secondary | ICD-10-CM | POA: Diagnosis not present

## 2019-12-27 DIAGNOSIS — E559 Vitamin D deficiency, unspecified: Secondary | ICD-10-CM | POA: Diagnosis not present

## 2019-12-27 DIAGNOSIS — E039 Hypothyroidism, unspecified: Secondary | ICD-10-CM | POA: Diagnosis not present

## 2019-12-31 DIAGNOSIS — Z79899 Other long term (current) drug therapy: Secondary | ICD-10-CM | POA: Diagnosis not present

## 2019-12-31 DIAGNOSIS — E876 Hypokalemia: Secondary | ICD-10-CM | POA: Diagnosis not present

## 2020-01-01 DIAGNOSIS — Z982 Presence of cerebrospinal fluid drainage device: Secondary | ICD-10-CM | POA: Diagnosis not present

## 2020-01-01 DIAGNOSIS — G912 (Idiopathic) normal pressure hydrocephalus: Secondary | ICD-10-CM | POA: Diagnosis not present

## 2020-01-01 DIAGNOSIS — E039 Hypothyroidism, unspecified: Secondary | ICD-10-CM | POA: Diagnosis not present

## 2020-01-01 DIAGNOSIS — I129 Hypertensive chronic kidney disease with stage 1 through stage 4 chronic kidney disease, or unspecified chronic kidney disease: Secondary | ICD-10-CM | POA: Diagnosis not present

## 2020-01-01 DIAGNOSIS — I69811 Memory deficit following other cerebrovascular disease: Secondary | ICD-10-CM | POA: Diagnosis not present

## 2020-01-01 DIAGNOSIS — Z87440 Personal history of urinary (tract) infections: Secondary | ICD-10-CM | POA: Diagnosis not present

## 2020-01-01 DIAGNOSIS — R911 Solitary pulmonary nodule: Secondary | ICD-10-CM | POA: Diagnosis not present

## 2020-01-01 DIAGNOSIS — N1831 Chronic kidney disease, stage 3a: Secondary | ICD-10-CM | POA: Diagnosis not present

## 2020-01-01 DIAGNOSIS — D631 Anemia in chronic kidney disease: Secondary | ICD-10-CM | POA: Diagnosis not present

## 2020-01-01 DIAGNOSIS — C8590 Non-Hodgkin lymphoma, unspecified, unspecified site: Secondary | ICD-10-CM | POA: Diagnosis not present

## 2020-01-01 DIAGNOSIS — I739 Peripheral vascular disease, unspecified: Secondary | ICD-10-CM | POA: Diagnosis not present

## 2020-01-01 DIAGNOSIS — E559 Vitamin D deficiency, unspecified: Secondary | ICD-10-CM | POA: Diagnosis not present

## 2020-01-02 DIAGNOSIS — D631 Anemia in chronic kidney disease: Secondary | ICD-10-CM | POA: Diagnosis not present

## 2020-01-02 DIAGNOSIS — Z982 Presence of cerebrospinal fluid drainage device: Secondary | ICD-10-CM | POA: Diagnosis not present

## 2020-01-02 DIAGNOSIS — I69811 Memory deficit following other cerebrovascular disease: Secondary | ICD-10-CM | POA: Diagnosis not present

## 2020-01-02 DIAGNOSIS — E039 Hypothyroidism, unspecified: Secondary | ICD-10-CM | POA: Diagnosis not present

## 2020-01-02 DIAGNOSIS — C8590 Non-Hodgkin lymphoma, unspecified, unspecified site: Secondary | ICD-10-CM | POA: Diagnosis not present

## 2020-01-02 DIAGNOSIS — Z87440 Personal history of urinary (tract) infections: Secondary | ICD-10-CM | POA: Diagnosis not present

## 2020-01-02 DIAGNOSIS — N1831 Chronic kidney disease, stage 3a: Secondary | ICD-10-CM | POA: Diagnosis not present

## 2020-01-02 DIAGNOSIS — I129 Hypertensive chronic kidney disease with stage 1 through stage 4 chronic kidney disease, or unspecified chronic kidney disease: Secondary | ICD-10-CM | POA: Diagnosis not present

## 2020-01-02 DIAGNOSIS — G912 (Idiopathic) normal pressure hydrocephalus: Secondary | ICD-10-CM | POA: Diagnosis not present

## 2020-01-02 DIAGNOSIS — E559 Vitamin D deficiency, unspecified: Secondary | ICD-10-CM | POA: Diagnosis not present

## 2020-01-02 DIAGNOSIS — R911 Solitary pulmonary nodule: Secondary | ICD-10-CM | POA: Diagnosis not present

## 2020-01-09 DIAGNOSIS — D631 Anemia in chronic kidney disease: Secondary | ICD-10-CM | POA: Diagnosis not present

## 2020-01-09 DIAGNOSIS — Z982 Presence of cerebrospinal fluid drainage device: Secondary | ICD-10-CM | POA: Diagnosis not present

## 2020-01-09 DIAGNOSIS — N1831 Chronic kidney disease, stage 3a: Secondary | ICD-10-CM | POA: Diagnosis not present

## 2020-01-09 DIAGNOSIS — E559 Vitamin D deficiency, unspecified: Secondary | ICD-10-CM | POA: Diagnosis not present

## 2020-01-09 DIAGNOSIS — C8590 Non-Hodgkin lymphoma, unspecified, unspecified site: Secondary | ICD-10-CM | POA: Diagnosis not present

## 2020-01-09 DIAGNOSIS — G912 (Idiopathic) normal pressure hydrocephalus: Secondary | ICD-10-CM | POA: Diagnosis not present

## 2020-01-09 DIAGNOSIS — I69811 Memory deficit following other cerebrovascular disease: Secondary | ICD-10-CM | POA: Diagnosis not present

## 2020-01-09 DIAGNOSIS — Z87440 Personal history of urinary (tract) infections: Secondary | ICD-10-CM | POA: Diagnosis not present

## 2020-01-09 DIAGNOSIS — E039 Hypothyroidism, unspecified: Secondary | ICD-10-CM | POA: Diagnosis not present

## 2020-01-09 DIAGNOSIS — R911 Solitary pulmonary nodule: Secondary | ICD-10-CM | POA: Diagnosis not present

## 2020-01-09 DIAGNOSIS — I129 Hypertensive chronic kidney disease with stage 1 through stage 4 chronic kidney disease, or unspecified chronic kidney disease: Secondary | ICD-10-CM | POA: Diagnosis not present

## 2020-01-16 DIAGNOSIS — Z982 Presence of cerebrospinal fluid drainage device: Secondary | ICD-10-CM | POA: Diagnosis not present

## 2020-01-16 DIAGNOSIS — A0472 Enterocolitis due to Clostridium difficile, not specified as recurrent: Secondary | ICD-10-CM | POA: Diagnosis not present

## 2020-01-16 DIAGNOSIS — R911 Solitary pulmonary nodule: Secondary | ICD-10-CM | POA: Diagnosis not present

## 2020-01-16 DIAGNOSIS — C8590 Non-Hodgkin lymphoma, unspecified, unspecified site: Secondary | ICD-10-CM | POA: Diagnosis not present

## 2020-01-16 DIAGNOSIS — Z8616 Personal history of COVID-19: Secondary | ICD-10-CM | POA: Diagnosis not present

## 2020-01-16 DIAGNOSIS — I129 Hypertensive chronic kidney disease with stage 1 through stage 4 chronic kidney disease, or unspecified chronic kidney disease: Secondary | ICD-10-CM | POA: Diagnosis not present

## 2020-01-16 DIAGNOSIS — D631 Anemia in chronic kidney disease: Secondary | ICD-10-CM | POA: Diagnosis not present

## 2020-01-16 DIAGNOSIS — N1831 Chronic kidney disease, stage 3a: Secondary | ICD-10-CM | POA: Diagnosis not present

## 2020-01-16 DIAGNOSIS — E039 Hypothyroidism, unspecified: Secondary | ICD-10-CM | POA: Diagnosis not present

## 2020-01-16 DIAGNOSIS — Z87440 Personal history of urinary (tract) infections: Secondary | ICD-10-CM | POA: Diagnosis not present

## 2020-01-16 DIAGNOSIS — I69811 Memory deficit following other cerebrovascular disease: Secondary | ICD-10-CM | POA: Diagnosis not present

## 2020-01-16 DIAGNOSIS — G912 (Idiopathic) normal pressure hydrocephalus: Secondary | ICD-10-CM | POA: Diagnosis not present

## 2020-01-16 DIAGNOSIS — E559 Vitamin D deficiency, unspecified: Secondary | ICD-10-CM | POA: Diagnosis not present

## 2020-01-23 DIAGNOSIS — Z982 Presence of cerebrospinal fluid drainage device: Secondary | ICD-10-CM | POA: Diagnosis not present

## 2020-01-23 DIAGNOSIS — I69811 Memory deficit following other cerebrovascular disease: Secondary | ICD-10-CM | POA: Diagnosis not present

## 2020-01-23 DIAGNOSIS — R911 Solitary pulmonary nodule: Secondary | ICD-10-CM | POA: Diagnosis not present

## 2020-01-23 DIAGNOSIS — E039 Hypothyroidism, unspecified: Secondary | ICD-10-CM | POA: Diagnosis not present

## 2020-01-23 DIAGNOSIS — E559 Vitamin D deficiency, unspecified: Secondary | ICD-10-CM | POA: Diagnosis not present

## 2020-01-23 DIAGNOSIS — Z8616 Personal history of COVID-19: Secondary | ICD-10-CM | POA: Diagnosis not present

## 2020-01-23 DIAGNOSIS — A0472 Enterocolitis due to Clostridium difficile, not specified as recurrent: Secondary | ICD-10-CM | POA: Diagnosis not present

## 2020-01-23 DIAGNOSIS — I129 Hypertensive chronic kidney disease with stage 1 through stage 4 chronic kidney disease, or unspecified chronic kidney disease: Secondary | ICD-10-CM | POA: Diagnosis not present

## 2020-01-23 DIAGNOSIS — Z87440 Personal history of urinary (tract) infections: Secondary | ICD-10-CM | POA: Diagnosis not present

## 2020-01-23 DIAGNOSIS — D631 Anemia in chronic kidney disease: Secondary | ICD-10-CM | POA: Diagnosis not present

## 2020-01-23 DIAGNOSIS — G912 (Idiopathic) normal pressure hydrocephalus: Secondary | ICD-10-CM | POA: Diagnosis not present

## 2020-01-23 DIAGNOSIS — N1831 Chronic kidney disease, stage 3a: Secondary | ICD-10-CM | POA: Diagnosis not present

## 2020-01-23 DIAGNOSIS — C8590 Non-Hodgkin lymphoma, unspecified, unspecified site: Secondary | ICD-10-CM | POA: Diagnosis not present

## 2020-01-27 DIAGNOSIS — D631 Anemia in chronic kidney disease: Secondary | ICD-10-CM | POA: Diagnosis not present

## 2020-01-27 DIAGNOSIS — I129 Hypertensive chronic kidney disease with stage 1 through stage 4 chronic kidney disease, or unspecified chronic kidney disease: Secondary | ICD-10-CM | POA: Diagnosis not present

## 2020-01-27 DIAGNOSIS — N1831 Chronic kidney disease, stage 3a: Secondary | ICD-10-CM | POA: Diagnosis not present

## 2020-01-27 DIAGNOSIS — I69811 Memory deficit following other cerebrovascular disease: Secondary | ICD-10-CM | POA: Diagnosis not present

## 2020-01-28 DIAGNOSIS — I10 Essential (primary) hypertension: Secondary | ICD-10-CM | POA: Diagnosis not present

## 2020-01-29 DIAGNOSIS — N1831 Chronic kidney disease, stage 3a: Secondary | ICD-10-CM | POA: Diagnosis not present

## 2020-01-29 DIAGNOSIS — Z8619 Personal history of other infectious and parasitic diseases: Secondary | ICD-10-CM | POA: Diagnosis not present

## 2020-01-29 DIAGNOSIS — G912 (Idiopathic) normal pressure hydrocephalus: Secondary | ICD-10-CM | POA: Diagnosis not present

## 2020-01-29 DIAGNOSIS — C8590 Non-Hodgkin lymphoma, unspecified, unspecified site: Secondary | ICD-10-CM | POA: Diagnosis not present

## 2020-01-29 DIAGNOSIS — Z87440 Personal history of urinary (tract) infections: Secondary | ICD-10-CM | POA: Diagnosis not present

## 2020-01-29 DIAGNOSIS — Z982 Presence of cerebrospinal fluid drainage device: Secondary | ICD-10-CM | POA: Diagnosis not present

## 2020-01-29 DIAGNOSIS — E559 Vitamin D deficiency, unspecified: Secondary | ICD-10-CM | POA: Diagnosis not present

## 2020-01-29 DIAGNOSIS — I129 Hypertensive chronic kidney disease with stage 1 through stage 4 chronic kidney disease, or unspecified chronic kidney disease: Secondary | ICD-10-CM | POA: Diagnosis not present

## 2020-01-29 DIAGNOSIS — E039 Hypothyroidism, unspecified: Secondary | ICD-10-CM | POA: Diagnosis not present

## 2020-01-29 DIAGNOSIS — Z8616 Personal history of COVID-19: Secondary | ICD-10-CM | POA: Diagnosis not present

## 2020-01-29 DIAGNOSIS — R911 Solitary pulmonary nodule: Secondary | ICD-10-CM | POA: Diagnosis not present

## 2020-01-29 DIAGNOSIS — D631 Anemia in chronic kidney disease: Secondary | ICD-10-CM | POA: Diagnosis not present

## 2020-01-29 DIAGNOSIS — I69811 Memory deficit following other cerebrovascular disease: Secondary | ICD-10-CM | POA: Diagnosis not present

## 2020-01-30 DIAGNOSIS — N1831 Chronic kidney disease, stage 3a: Secondary | ICD-10-CM | POA: Diagnosis not present

## 2020-01-30 DIAGNOSIS — D631 Anemia in chronic kidney disease: Secondary | ICD-10-CM | POA: Diagnosis not present

## 2020-01-30 DIAGNOSIS — Z8616 Personal history of COVID-19: Secondary | ICD-10-CM | POA: Diagnosis not present

## 2020-01-30 DIAGNOSIS — R911 Solitary pulmonary nodule: Secondary | ICD-10-CM | POA: Diagnosis not present

## 2020-01-30 DIAGNOSIS — Z8619 Personal history of other infectious and parasitic diseases: Secondary | ICD-10-CM | POA: Diagnosis not present

## 2020-01-30 DIAGNOSIS — E039 Hypothyroidism, unspecified: Secondary | ICD-10-CM | POA: Diagnosis not present

## 2020-01-30 DIAGNOSIS — G912 (Idiopathic) normal pressure hydrocephalus: Secondary | ICD-10-CM | POA: Diagnosis not present

## 2020-01-30 DIAGNOSIS — Z982 Presence of cerebrospinal fluid drainage device: Secondary | ICD-10-CM | POA: Diagnosis not present

## 2020-01-30 DIAGNOSIS — Z87440 Personal history of urinary (tract) infections: Secondary | ICD-10-CM | POA: Diagnosis not present

## 2020-01-30 DIAGNOSIS — E559 Vitamin D deficiency, unspecified: Secondary | ICD-10-CM | POA: Diagnosis not present

## 2020-01-30 DIAGNOSIS — I129 Hypertensive chronic kidney disease with stage 1 through stage 4 chronic kidney disease, or unspecified chronic kidney disease: Secondary | ICD-10-CM | POA: Diagnosis not present

## 2020-01-30 DIAGNOSIS — I69811 Memory deficit following other cerebrovascular disease: Secondary | ICD-10-CM | POA: Diagnosis not present

## 2020-01-30 DIAGNOSIS — C8590 Non-Hodgkin lymphoma, unspecified, unspecified site: Secondary | ICD-10-CM | POA: Diagnosis not present

## 2020-02-01 DIAGNOSIS — I739 Peripheral vascular disease, unspecified: Secondary | ICD-10-CM | POA: Diagnosis not present

## 2020-02-03 DIAGNOSIS — Z87440 Personal history of urinary (tract) infections: Secondary | ICD-10-CM | POA: Diagnosis not present

## 2020-02-03 DIAGNOSIS — Z8619 Personal history of other infectious and parasitic diseases: Secondary | ICD-10-CM | POA: Diagnosis not present

## 2020-02-03 DIAGNOSIS — I69811 Memory deficit following other cerebrovascular disease: Secondary | ICD-10-CM | POA: Diagnosis not present

## 2020-02-03 DIAGNOSIS — Z982 Presence of cerebrospinal fluid drainage device: Secondary | ICD-10-CM | POA: Diagnosis not present

## 2020-02-03 DIAGNOSIS — E039 Hypothyroidism, unspecified: Secondary | ICD-10-CM | POA: Diagnosis not present

## 2020-02-03 DIAGNOSIS — G912 (Idiopathic) normal pressure hydrocephalus: Secondary | ICD-10-CM | POA: Diagnosis not present

## 2020-02-03 DIAGNOSIS — R911 Solitary pulmonary nodule: Secondary | ICD-10-CM | POA: Diagnosis not present

## 2020-02-03 DIAGNOSIS — Z8616 Personal history of COVID-19: Secondary | ICD-10-CM | POA: Diagnosis not present

## 2020-02-03 DIAGNOSIS — C8590 Non-Hodgkin lymphoma, unspecified, unspecified site: Secondary | ICD-10-CM | POA: Diagnosis not present

## 2020-02-03 DIAGNOSIS — I129 Hypertensive chronic kidney disease with stage 1 through stage 4 chronic kidney disease, or unspecified chronic kidney disease: Secondary | ICD-10-CM | POA: Diagnosis not present

## 2020-02-03 DIAGNOSIS — N1831 Chronic kidney disease, stage 3a: Secondary | ICD-10-CM | POA: Diagnosis not present

## 2020-02-03 DIAGNOSIS — D631 Anemia in chronic kidney disease: Secondary | ICD-10-CM | POA: Diagnosis not present

## 2020-02-03 DIAGNOSIS — E559 Vitamin D deficiency, unspecified: Secondary | ICD-10-CM | POA: Diagnosis not present

## 2020-02-06 DIAGNOSIS — N1831 Chronic kidney disease, stage 3a: Secondary | ICD-10-CM | POA: Diagnosis not present

## 2020-02-06 DIAGNOSIS — R911 Solitary pulmonary nodule: Secondary | ICD-10-CM | POA: Diagnosis not present

## 2020-02-06 DIAGNOSIS — E039 Hypothyroidism, unspecified: Secondary | ICD-10-CM | POA: Diagnosis not present

## 2020-02-06 DIAGNOSIS — Z8616 Personal history of COVID-19: Secondary | ICD-10-CM | POA: Diagnosis not present

## 2020-02-06 DIAGNOSIS — G912 (Idiopathic) normal pressure hydrocephalus: Secondary | ICD-10-CM | POA: Diagnosis not present

## 2020-02-06 DIAGNOSIS — Z87440 Personal history of urinary (tract) infections: Secondary | ICD-10-CM | POA: Diagnosis not present

## 2020-02-06 DIAGNOSIS — I129 Hypertensive chronic kidney disease with stage 1 through stage 4 chronic kidney disease, or unspecified chronic kidney disease: Secondary | ICD-10-CM | POA: Diagnosis not present

## 2020-02-06 DIAGNOSIS — D631 Anemia in chronic kidney disease: Secondary | ICD-10-CM | POA: Diagnosis not present

## 2020-02-06 DIAGNOSIS — E559 Vitamin D deficiency, unspecified: Secondary | ICD-10-CM | POA: Diagnosis not present

## 2020-02-06 DIAGNOSIS — C8590 Non-Hodgkin lymphoma, unspecified, unspecified site: Secondary | ICD-10-CM | POA: Diagnosis not present

## 2020-02-06 DIAGNOSIS — Z982 Presence of cerebrospinal fluid drainage device: Secondary | ICD-10-CM | POA: Diagnosis not present

## 2020-02-06 DIAGNOSIS — Z8619 Personal history of other infectious and parasitic diseases: Secondary | ICD-10-CM | POA: Diagnosis not present

## 2020-02-06 DIAGNOSIS — I69811 Memory deficit following other cerebrovascular disease: Secondary | ICD-10-CM | POA: Diagnosis not present

## 2020-02-13 DIAGNOSIS — Z982 Presence of cerebrospinal fluid drainage device: Secondary | ICD-10-CM | POA: Diagnosis not present

## 2020-02-13 DIAGNOSIS — C8590 Non-Hodgkin lymphoma, unspecified, unspecified site: Secondary | ICD-10-CM | POA: Diagnosis not present

## 2020-02-13 DIAGNOSIS — E039 Hypothyroidism, unspecified: Secondary | ICD-10-CM | POA: Diagnosis not present

## 2020-02-13 DIAGNOSIS — E559 Vitamin D deficiency, unspecified: Secondary | ICD-10-CM | POA: Diagnosis not present

## 2020-02-13 DIAGNOSIS — Z8616 Personal history of COVID-19: Secondary | ICD-10-CM | POA: Diagnosis not present

## 2020-02-13 DIAGNOSIS — I69811 Memory deficit following other cerebrovascular disease: Secondary | ICD-10-CM | POA: Diagnosis not present

## 2020-02-13 DIAGNOSIS — Z87440 Personal history of urinary (tract) infections: Secondary | ICD-10-CM | POA: Diagnosis not present

## 2020-02-13 DIAGNOSIS — D631 Anemia in chronic kidney disease: Secondary | ICD-10-CM | POA: Diagnosis not present

## 2020-02-13 DIAGNOSIS — N1831 Chronic kidney disease, stage 3a: Secondary | ICD-10-CM | POA: Diagnosis not present

## 2020-02-13 DIAGNOSIS — G912 (Idiopathic) normal pressure hydrocephalus: Secondary | ICD-10-CM | POA: Diagnosis not present

## 2020-02-13 DIAGNOSIS — I129 Hypertensive chronic kidney disease with stage 1 through stage 4 chronic kidney disease, or unspecified chronic kidney disease: Secondary | ICD-10-CM | POA: Diagnosis not present

## 2020-02-13 DIAGNOSIS — Z8619 Personal history of other infectious and parasitic diseases: Secondary | ICD-10-CM | POA: Diagnosis not present

## 2020-02-13 DIAGNOSIS — R911 Solitary pulmonary nodule: Secondary | ICD-10-CM | POA: Diagnosis not present

## 2020-02-19 DIAGNOSIS — Z982 Presence of cerebrospinal fluid drainage device: Secondary | ICD-10-CM | POA: Diagnosis not present

## 2020-02-19 DIAGNOSIS — I69811 Memory deficit following other cerebrovascular disease: Secondary | ICD-10-CM | POA: Diagnosis not present

## 2020-02-19 DIAGNOSIS — C8590 Non-Hodgkin lymphoma, unspecified, unspecified site: Secondary | ICD-10-CM | POA: Diagnosis not present

## 2020-02-19 DIAGNOSIS — D631 Anemia in chronic kidney disease: Secondary | ICD-10-CM | POA: Diagnosis not present

## 2020-02-19 DIAGNOSIS — N1831 Chronic kidney disease, stage 3a: Secondary | ICD-10-CM | POA: Diagnosis not present

## 2020-02-19 DIAGNOSIS — E559 Vitamin D deficiency, unspecified: Secondary | ICD-10-CM | POA: Diagnosis not present

## 2020-02-19 DIAGNOSIS — I129 Hypertensive chronic kidney disease with stage 1 through stage 4 chronic kidney disease, or unspecified chronic kidney disease: Secondary | ICD-10-CM | POA: Diagnosis not present

## 2020-02-19 DIAGNOSIS — E039 Hypothyroidism, unspecified: Secondary | ICD-10-CM | POA: Diagnosis not present

## 2020-02-19 DIAGNOSIS — Z8619 Personal history of other infectious and parasitic diseases: Secondary | ICD-10-CM | POA: Diagnosis not present

## 2020-02-19 DIAGNOSIS — Z87440 Personal history of urinary (tract) infections: Secondary | ICD-10-CM | POA: Diagnosis not present

## 2020-02-19 DIAGNOSIS — G912 (Idiopathic) normal pressure hydrocephalus: Secondary | ICD-10-CM | POA: Diagnosis not present

## 2020-02-19 DIAGNOSIS — R911 Solitary pulmonary nodule: Secondary | ICD-10-CM | POA: Diagnosis not present

## 2020-02-19 DIAGNOSIS — Z8616 Personal history of COVID-19: Secondary | ICD-10-CM | POA: Diagnosis not present

## 2020-02-20 DIAGNOSIS — I69811 Memory deficit following other cerebrovascular disease: Secondary | ICD-10-CM | POA: Diagnosis not present

## 2020-02-20 DIAGNOSIS — G912 (Idiopathic) normal pressure hydrocephalus: Secondary | ICD-10-CM | POA: Diagnosis not present

## 2020-02-20 DIAGNOSIS — D631 Anemia in chronic kidney disease: Secondary | ICD-10-CM | POA: Diagnosis not present

## 2020-02-20 DIAGNOSIS — I129 Hypertensive chronic kidney disease with stage 1 through stage 4 chronic kidney disease, or unspecified chronic kidney disease: Secondary | ICD-10-CM | POA: Diagnosis not present

## 2020-02-20 DIAGNOSIS — N1831 Chronic kidney disease, stage 3a: Secondary | ICD-10-CM | POA: Diagnosis not present

## 2020-02-20 DIAGNOSIS — Z8616 Personal history of COVID-19: Secondary | ICD-10-CM | POA: Diagnosis not present

## 2020-02-20 DIAGNOSIS — E039 Hypothyroidism, unspecified: Secondary | ICD-10-CM | POA: Diagnosis not present

## 2020-02-20 DIAGNOSIS — Z8619 Personal history of other infectious and parasitic diseases: Secondary | ICD-10-CM | POA: Diagnosis not present

## 2020-02-20 DIAGNOSIS — R911 Solitary pulmonary nodule: Secondary | ICD-10-CM | POA: Diagnosis not present

## 2020-02-20 DIAGNOSIS — C8590 Non-Hodgkin lymphoma, unspecified, unspecified site: Secondary | ICD-10-CM | POA: Diagnosis not present

## 2020-02-20 DIAGNOSIS — Z982 Presence of cerebrospinal fluid drainage device: Secondary | ICD-10-CM | POA: Diagnosis not present

## 2020-02-20 DIAGNOSIS — E559 Vitamin D deficiency, unspecified: Secondary | ICD-10-CM | POA: Diagnosis not present

## 2020-02-20 DIAGNOSIS — Z87440 Personal history of urinary (tract) infections: Secondary | ICD-10-CM | POA: Diagnosis not present

## 2020-02-26 DIAGNOSIS — C8590 Non-Hodgkin lymphoma, unspecified, unspecified site: Secondary | ICD-10-CM | POA: Diagnosis not present

## 2020-02-26 DIAGNOSIS — Z982 Presence of cerebrospinal fluid drainage device: Secondary | ICD-10-CM | POA: Diagnosis not present

## 2020-02-26 DIAGNOSIS — E559 Vitamin D deficiency, unspecified: Secondary | ICD-10-CM | POA: Diagnosis not present

## 2020-02-26 DIAGNOSIS — D631 Anemia in chronic kidney disease: Secondary | ICD-10-CM | POA: Diagnosis not present

## 2020-02-26 DIAGNOSIS — Z87440 Personal history of urinary (tract) infections: Secondary | ICD-10-CM | POA: Diagnosis not present

## 2020-02-26 DIAGNOSIS — I69811 Memory deficit following other cerebrovascular disease: Secondary | ICD-10-CM | POA: Diagnosis not present

## 2020-02-26 DIAGNOSIS — I129 Hypertensive chronic kidney disease with stage 1 through stage 4 chronic kidney disease, or unspecified chronic kidney disease: Secondary | ICD-10-CM | POA: Diagnosis not present

## 2020-02-26 DIAGNOSIS — Z8619 Personal history of other infectious and parasitic diseases: Secondary | ICD-10-CM | POA: Diagnosis not present

## 2020-02-26 DIAGNOSIS — Z8616 Personal history of COVID-19: Secondary | ICD-10-CM | POA: Diagnosis not present

## 2020-02-26 DIAGNOSIS — G912 (Idiopathic) normal pressure hydrocephalus: Secondary | ICD-10-CM | POA: Diagnosis not present

## 2020-02-26 DIAGNOSIS — R911 Solitary pulmonary nodule: Secondary | ICD-10-CM | POA: Diagnosis not present

## 2020-02-26 DIAGNOSIS — N1831 Chronic kidney disease, stage 3a: Secondary | ICD-10-CM | POA: Diagnosis not present

## 2020-02-26 DIAGNOSIS — E039 Hypothyroidism, unspecified: Secondary | ICD-10-CM | POA: Diagnosis not present

## 2020-02-27 DIAGNOSIS — E039 Hypothyroidism, unspecified: Secondary | ICD-10-CM | POA: Diagnosis not present

## 2020-02-27 DIAGNOSIS — R911 Solitary pulmonary nodule: Secondary | ICD-10-CM | POA: Diagnosis not present

## 2020-02-27 DIAGNOSIS — N1831 Chronic kidney disease, stage 3a: Secondary | ICD-10-CM | POA: Diagnosis not present

## 2020-02-27 DIAGNOSIS — C8590 Non-Hodgkin lymphoma, unspecified, unspecified site: Secondary | ICD-10-CM | POA: Diagnosis not present

## 2020-02-27 DIAGNOSIS — G912 (Idiopathic) normal pressure hydrocephalus: Secondary | ICD-10-CM | POA: Diagnosis not present

## 2020-02-27 DIAGNOSIS — Z87440 Personal history of urinary (tract) infections: Secondary | ICD-10-CM | POA: Diagnosis not present

## 2020-02-27 DIAGNOSIS — I129 Hypertensive chronic kidney disease with stage 1 through stage 4 chronic kidney disease, or unspecified chronic kidney disease: Secondary | ICD-10-CM | POA: Diagnosis not present

## 2020-02-27 DIAGNOSIS — Z982 Presence of cerebrospinal fluid drainage device: Secondary | ICD-10-CM | POA: Diagnosis not present

## 2020-02-27 DIAGNOSIS — D631 Anemia in chronic kidney disease: Secondary | ICD-10-CM | POA: Diagnosis not present

## 2020-02-27 DIAGNOSIS — Z8616 Personal history of COVID-19: Secondary | ICD-10-CM | POA: Diagnosis not present

## 2020-02-27 DIAGNOSIS — E559 Vitamin D deficiency, unspecified: Secondary | ICD-10-CM | POA: Diagnosis not present

## 2020-02-27 DIAGNOSIS — I69811 Memory deficit following other cerebrovascular disease: Secondary | ICD-10-CM | POA: Diagnosis not present

## 2020-02-27 DIAGNOSIS — Z8619 Personal history of other infectious and parasitic diseases: Secondary | ICD-10-CM | POA: Diagnosis not present

## 2020-02-28 DIAGNOSIS — N1831 Chronic kidney disease, stage 3a: Secondary | ICD-10-CM | POA: Diagnosis not present

## 2020-02-28 DIAGNOSIS — I129 Hypertensive chronic kidney disease with stage 1 through stage 4 chronic kidney disease, or unspecified chronic kidney disease: Secondary | ICD-10-CM | POA: Diagnosis not present

## 2020-02-28 DIAGNOSIS — E039 Hypothyroidism, unspecified: Secondary | ICD-10-CM | POA: Diagnosis not present

## 2020-02-28 DIAGNOSIS — D631 Anemia in chronic kidney disease: Secondary | ICD-10-CM | POA: Diagnosis not present

## 2020-02-28 DIAGNOSIS — G912 (Idiopathic) normal pressure hydrocephalus: Secondary | ICD-10-CM | POA: Diagnosis not present

## 2020-02-28 DIAGNOSIS — Z8616 Personal history of COVID-19: Secondary | ICD-10-CM | POA: Diagnosis not present

## 2020-02-28 DIAGNOSIS — I69811 Memory deficit following other cerebrovascular disease: Secondary | ICD-10-CM | POA: Diagnosis not present

## 2020-02-28 DIAGNOSIS — C8590 Non-Hodgkin lymphoma, unspecified, unspecified site: Secondary | ICD-10-CM | POA: Diagnosis not present

## 2020-02-28 DIAGNOSIS — R911 Solitary pulmonary nodule: Secondary | ICD-10-CM | POA: Diagnosis not present

## 2020-02-28 DIAGNOSIS — Z87440 Personal history of urinary (tract) infections: Secondary | ICD-10-CM | POA: Diagnosis not present

## 2020-02-28 DIAGNOSIS — E559 Vitamin D deficiency, unspecified: Secondary | ICD-10-CM | POA: Diagnosis not present

## 2020-02-28 DIAGNOSIS — Z8619 Personal history of other infectious and parasitic diseases: Secondary | ICD-10-CM | POA: Diagnosis not present

## 2020-02-28 DIAGNOSIS — Z982 Presence of cerebrospinal fluid drainage device: Secondary | ICD-10-CM | POA: Diagnosis not present

## 2020-03-02 DIAGNOSIS — R911 Solitary pulmonary nodule: Secondary | ICD-10-CM | POA: Diagnosis not present

## 2020-03-02 DIAGNOSIS — I69811 Memory deficit following other cerebrovascular disease: Secondary | ICD-10-CM | POA: Diagnosis not present

## 2020-03-02 DIAGNOSIS — N1831 Chronic kidney disease, stage 3a: Secondary | ICD-10-CM | POA: Diagnosis not present

## 2020-03-02 DIAGNOSIS — I739 Peripheral vascular disease, unspecified: Secondary | ICD-10-CM | POA: Diagnosis not present

## 2020-03-02 DIAGNOSIS — I129 Hypertensive chronic kidney disease with stage 1 through stage 4 chronic kidney disease, or unspecified chronic kidney disease: Secondary | ICD-10-CM | POA: Diagnosis not present

## 2020-03-02 DIAGNOSIS — Z8619 Personal history of other infectious and parasitic diseases: Secondary | ICD-10-CM | POA: Diagnosis not present

## 2020-03-02 DIAGNOSIS — Z982 Presence of cerebrospinal fluid drainage device: Secondary | ICD-10-CM | POA: Diagnosis not present

## 2020-03-02 DIAGNOSIS — Z87440 Personal history of urinary (tract) infections: Secondary | ICD-10-CM | POA: Diagnosis not present

## 2020-03-02 DIAGNOSIS — E559 Vitamin D deficiency, unspecified: Secondary | ICD-10-CM | POA: Diagnosis not present

## 2020-03-02 DIAGNOSIS — D631 Anemia in chronic kidney disease: Secondary | ICD-10-CM | POA: Diagnosis not present

## 2020-03-02 DIAGNOSIS — G912 (Idiopathic) normal pressure hydrocephalus: Secondary | ICD-10-CM | POA: Diagnosis not present

## 2020-03-02 DIAGNOSIS — E039 Hypothyroidism, unspecified: Secondary | ICD-10-CM | POA: Diagnosis not present

## 2020-03-02 DIAGNOSIS — C8590 Non-Hodgkin lymphoma, unspecified, unspecified site: Secondary | ICD-10-CM | POA: Diagnosis not present

## 2020-03-02 DIAGNOSIS — Z8616 Personal history of COVID-19: Secondary | ICD-10-CM | POA: Diagnosis not present

## 2020-03-05 DIAGNOSIS — Z8619 Personal history of other infectious and parasitic diseases: Secondary | ICD-10-CM | POA: Diagnosis not present

## 2020-03-05 DIAGNOSIS — C8590 Non-Hodgkin lymphoma, unspecified, unspecified site: Secondary | ICD-10-CM | POA: Diagnosis not present

## 2020-03-05 DIAGNOSIS — E039 Hypothyroidism, unspecified: Secondary | ICD-10-CM | POA: Diagnosis not present

## 2020-03-05 DIAGNOSIS — R911 Solitary pulmonary nodule: Secondary | ICD-10-CM | POA: Diagnosis not present

## 2020-03-05 DIAGNOSIS — G912 (Idiopathic) normal pressure hydrocephalus: Secondary | ICD-10-CM | POA: Diagnosis not present

## 2020-03-05 DIAGNOSIS — I129 Hypertensive chronic kidney disease with stage 1 through stage 4 chronic kidney disease, or unspecified chronic kidney disease: Secondary | ICD-10-CM | POA: Diagnosis not present

## 2020-03-05 DIAGNOSIS — Z87440 Personal history of urinary (tract) infections: Secondary | ICD-10-CM | POA: Diagnosis not present

## 2020-03-05 DIAGNOSIS — Z982 Presence of cerebrospinal fluid drainage device: Secondary | ICD-10-CM | POA: Diagnosis not present

## 2020-03-05 DIAGNOSIS — Z8616 Personal history of COVID-19: Secondary | ICD-10-CM | POA: Diagnosis not present

## 2020-03-05 DIAGNOSIS — I69811 Memory deficit following other cerebrovascular disease: Secondary | ICD-10-CM | POA: Diagnosis not present

## 2020-03-05 DIAGNOSIS — E559 Vitamin D deficiency, unspecified: Secondary | ICD-10-CM | POA: Diagnosis not present

## 2020-03-05 DIAGNOSIS — D631 Anemia in chronic kidney disease: Secondary | ICD-10-CM | POA: Diagnosis not present

## 2020-03-05 DIAGNOSIS — N1831 Chronic kidney disease, stage 3a: Secondary | ICD-10-CM | POA: Diagnosis not present

## 2020-03-11 DIAGNOSIS — R911 Solitary pulmonary nodule: Secondary | ICD-10-CM | POA: Diagnosis not present

## 2020-03-11 DIAGNOSIS — Z87440 Personal history of urinary (tract) infections: Secondary | ICD-10-CM | POA: Diagnosis not present

## 2020-03-11 DIAGNOSIS — E039 Hypothyroidism, unspecified: Secondary | ICD-10-CM | POA: Diagnosis not present

## 2020-03-11 DIAGNOSIS — G912 (Idiopathic) normal pressure hydrocephalus: Secondary | ICD-10-CM | POA: Diagnosis not present

## 2020-03-11 DIAGNOSIS — I69811 Memory deficit following other cerebrovascular disease: Secondary | ICD-10-CM | POA: Diagnosis not present

## 2020-03-11 DIAGNOSIS — N1831 Chronic kidney disease, stage 3a: Secondary | ICD-10-CM | POA: Diagnosis not present

## 2020-03-11 DIAGNOSIS — D631 Anemia in chronic kidney disease: Secondary | ICD-10-CM | POA: Diagnosis not present

## 2020-03-11 DIAGNOSIS — E559 Vitamin D deficiency, unspecified: Secondary | ICD-10-CM | POA: Diagnosis not present

## 2020-03-11 DIAGNOSIS — Z8619 Personal history of other infectious and parasitic diseases: Secondary | ICD-10-CM | POA: Diagnosis not present

## 2020-03-11 DIAGNOSIS — I129 Hypertensive chronic kidney disease with stage 1 through stage 4 chronic kidney disease, or unspecified chronic kidney disease: Secondary | ICD-10-CM | POA: Diagnosis not present

## 2020-03-11 DIAGNOSIS — Z8616 Personal history of COVID-19: Secondary | ICD-10-CM | POA: Diagnosis not present

## 2020-03-11 DIAGNOSIS — Z982 Presence of cerebrospinal fluid drainage device: Secondary | ICD-10-CM | POA: Diagnosis not present

## 2020-03-11 DIAGNOSIS — C8590 Non-Hodgkin lymphoma, unspecified, unspecified site: Secondary | ICD-10-CM | POA: Diagnosis not present

## 2020-03-12 DIAGNOSIS — N1831 Chronic kidney disease, stage 3a: Secondary | ICD-10-CM | POA: Diagnosis not present

## 2020-03-12 DIAGNOSIS — E039 Hypothyroidism, unspecified: Secondary | ICD-10-CM | POA: Diagnosis not present

## 2020-03-12 DIAGNOSIS — Z8616 Personal history of COVID-19: Secondary | ICD-10-CM | POA: Diagnosis not present

## 2020-03-12 DIAGNOSIS — C8590 Non-Hodgkin lymphoma, unspecified, unspecified site: Secondary | ICD-10-CM | POA: Diagnosis not present

## 2020-03-12 DIAGNOSIS — Z982 Presence of cerebrospinal fluid drainage device: Secondary | ICD-10-CM | POA: Diagnosis not present

## 2020-03-12 DIAGNOSIS — E559 Vitamin D deficiency, unspecified: Secondary | ICD-10-CM | POA: Diagnosis not present

## 2020-03-12 DIAGNOSIS — Z8619 Personal history of other infectious and parasitic diseases: Secondary | ICD-10-CM | POA: Diagnosis not present

## 2020-03-12 DIAGNOSIS — I69811 Memory deficit following other cerebrovascular disease: Secondary | ICD-10-CM | POA: Diagnosis not present

## 2020-03-12 DIAGNOSIS — R911 Solitary pulmonary nodule: Secondary | ICD-10-CM | POA: Diagnosis not present

## 2020-03-12 DIAGNOSIS — D631 Anemia in chronic kidney disease: Secondary | ICD-10-CM | POA: Diagnosis not present

## 2020-03-12 DIAGNOSIS — I129 Hypertensive chronic kidney disease with stage 1 through stage 4 chronic kidney disease, or unspecified chronic kidney disease: Secondary | ICD-10-CM | POA: Diagnosis not present

## 2020-03-12 DIAGNOSIS — G912 (Idiopathic) normal pressure hydrocephalus: Secondary | ICD-10-CM | POA: Diagnosis not present

## 2020-03-12 DIAGNOSIS — Z87440 Personal history of urinary (tract) infections: Secondary | ICD-10-CM | POA: Diagnosis not present

## 2020-03-16 DIAGNOSIS — D631 Anemia in chronic kidney disease: Secondary | ICD-10-CM | POA: Diagnosis not present

## 2020-03-16 DIAGNOSIS — R911 Solitary pulmonary nodule: Secondary | ICD-10-CM | POA: Diagnosis not present

## 2020-03-16 DIAGNOSIS — Z982 Presence of cerebrospinal fluid drainage device: Secondary | ICD-10-CM | POA: Diagnosis not present

## 2020-03-16 DIAGNOSIS — C8590 Non-Hodgkin lymphoma, unspecified, unspecified site: Secondary | ICD-10-CM | POA: Diagnosis not present

## 2020-03-16 DIAGNOSIS — N1831 Chronic kidney disease, stage 3a: Secondary | ICD-10-CM | POA: Diagnosis not present

## 2020-03-16 DIAGNOSIS — G912 (Idiopathic) normal pressure hydrocephalus: Secondary | ICD-10-CM | POA: Diagnosis not present

## 2020-03-16 DIAGNOSIS — E559 Vitamin D deficiency, unspecified: Secondary | ICD-10-CM | POA: Diagnosis not present

## 2020-03-16 DIAGNOSIS — E039 Hypothyroidism, unspecified: Secondary | ICD-10-CM | POA: Diagnosis not present

## 2020-03-16 DIAGNOSIS — I129 Hypertensive chronic kidney disease with stage 1 through stage 4 chronic kidney disease, or unspecified chronic kidney disease: Secondary | ICD-10-CM | POA: Diagnosis not present

## 2020-03-16 DIAGNOSIS — Z87440 Personal history of urinary (tract) infections: Secondary | ICD-10-CM | POA: Diagnosis not present

## 2020-03-16 DIAGNOSIS — I69811 Memory deficit following other cerebrovascular disease: Secondary | ICD-10-CM | POA: Diagnosis not present

## 2020-03-16 DIAGNOSIS — Z8619 Personal history of other infectious and parasitic diseases: Secondary | ICD-10-CM | POA: Diagnosis not present

## 2020-03-19 DIAGNOSIS — R911 Solitary pulmonary nodule: Secondary | ICD-10-CM | POA: Diagnosis not present

## 2020-03-19 DIAGNOSIS — Z8619 Personal history of other infectious and parasitic diseases: Secondary | ICD-10-CM | POA: Diagnosis not present

## 2020-03-19 DIAGNOSIS — E559 Vitamin D deficiency, unspecified: Secondary | ICD-10-CM | POA: Diagnosis not present

## 2020-03-19 DIAGNOSIS — C8590 Non-Hodgkin lymphoma, unspecified, unspecified site: Secondary | ICD-10-CM | POA: Diagnosis not present

## 2020-03-19 DIAGNOSIS — G912 (Idiopathic) normal pressure hydrocephalus: Secondary | ICD-10-CM | POA: Diagnosis not present

## 2020-03-19 DIAGNOSIS — E039 Hypothyroidism, unspecified: Secondary | ICD-10-CM | POA: Diagnosis not present

## 2020-03-19 DIAGNOSIS — I129 Hypertensive chronic kidney disease with stage 1 through stage 4 chronic kidney disease, or unspecified chronic kidney disease: Secondary | ICD-10-CM | POA: Diagnosis not present

## 2020-03-19 DIAGNOSIS — I69811 Memory deficit following other cerebrovascular disease: Secondary | ICD-10-CM | POA: Diagnosis not present

## 2020-03-19 DIAGNOSIS — Z982 Presence of cerebrospinal fluid drainage device: Secondary | ICD-10-CM | POA: Diagnosis not present

## 2020-03-19 DIAGNOSIS — Z87440 Personal history of urinary (tract) infections: Secondary | ICD-10-CM | POA: Diagnosis not present

## 2020-03-19 DIAGNOSIS — D631 Anemia in chronic kidney disease: Secondary | ICD-10-CM | POA: Diagnosis not present

## 2020-03-19 DIAGNOSIS — N1831 Chronic kidney disease, stage 3a: Secondary | ICD-10-CM | POA: Diagnosis not present

## 2020-03-25 DIAGNOSIS — D631 Anemia in chronic kidney disease: Secondary | ICD-10-CM | POA: Diagnosis not present

## 2020-03-25 DIAGNOSIS — I69811 Memory deficit following other cerebrovascular disease: Secondary | ICD-10-CM | POA: Diagnosis not present

## 2020-03-25 DIAGNOSIS — E039 Hypothyroidism, unspecified: Secondary | ICD-10-CM | POA: Diagnosis not present

## 2020-03-25 DIAGNOSIS — Z982 Presence of cerebrospinal fluid drainage device: Secondary | ICD-10-CM | POA: Diagnosis not present

## 2020-03-25 DIAGNOSIS — Z87440 Personal history of urinary (tract) infections: Secondary | ICD-10-CM | POA: Diagnosis not present

## 2020-03-25 DIAGNOSIS — I129 Hypertensive chronic kidney disease with stage 1 through stage 4 chronic kidney disease, or unspecified chronic kidney disease: Secondary | ICD-10-CM | POA: Diagnosis not present

## 2020-03-25 DIAGNOSIS — R911 Solitary pulmonary nodule: Secondary | ICD-10-CM | POA: Diagnosis not present

## 2020-03-25 DIAGNOSIS — C8590 Non-Hodgkin lymphoma, unspecified, unspecified site: Secondary | ICD-10-CM | POA: Diagnosis not present

## 2020-03-25 DIAGNOSIS — N1831 Chronic kidney disease, stage 3a: Secondary | ICD-10-CM | POA: Diagnosis not present

## 2020-03-25 DIAGNOSIS — E559 Vitamin D deficiency, unspecified: Secondary | ICD-10-CM | POA: Diagnosis not present

## 2020-03-25 DIAGNOSIS — Z8619 Personal history of other infectious and parasitic diseases: Secondary | ICD-10-CM | POA: Diagnosis not present

## 2020-03-25 DIAGNOSIS — G912 (Idiopathic) normal pressure hydrocephalus: Secondary | ICD-10-CM | POA: Diagnosis not present

## 2020-03-26 DIAGNOSIS — Z982 Presence of cerebrospinal fluid drainage device: Secondary | ICD-10-CM | POA: Diagnosis not present

## 2020-03-26 DIAGNOSIS — Z87440 Personal history of urinary (tract) infections: Secondary | ICD-10-CM | POA: Diagnosis not present

## 2020-03-26 DIAGNOSIS — C8590 Non-Hodgkin lymphoma, unspecified, unspecified site: Secondary | ICD-10-CM | POA: Diagnosis not present

## 2020-03-26 DIAGNOSIS — Z8619 Personal history of other infectious and parasitic diseases: Secondary | ICD-10-CM | POA: Diagnosis not present

## 2020-03-26 DIAGNOSIS — I129 Hypertensive chronic kidney disease with stage 1 through stage 4 chronic kidney disease, or unspecified chronic kidney disease: Secondary | ICD-10-CM | POA: Diagnosis not present

## 2020-03-26 DIAGNOSIS — E039 Hypothyroidism, unspecified: Secondary | ICD-10-CM | POA: Diagnosis not present

## 2020-03-26 DIAGNOSIS — N1831 Chronic kidney disease, stage 3a: Secondary | ICD-10-CM | POA: Diagnosis not present

## 2020-03-26 DIAGNOSIS — D631 Anemia in chronic kidney disease: Secondary | ICD-10-CM | POA: Diagnosis not present

## 2020-03-26 DIAGNOSIS — R911 Solitary pulmonary nodule: Secondary | ICD-10-CM | POA: Diagnosis not present

## 2020-03-26 DIAGNOSIS — I69811 Memory deficit following other cerebrovascular disease: Secondary | ICD-10-CM | POA: Diagnosis not present

## 2020-03-26 DIAGNOSIS — E559 Vitamin D deficiency, unspecified: Secondary | ICD-10-CM | POA: Diagnosis not present

## 2020-03-26 DIAGNOSIS — G912 (Idiopathic) normal pressure hydrocephalus: Secondary | ICD-10-CM | POA: Diagnosis not present

## 2020-03-27 DIAGNOSIS — I69811 Memory deficit following other cerebrovascular disease: Secondary | ICD-10-CM | POA: Diagnosis not present

## 2020-03-27 DIAGNOSIS — N1831 Chronic kidney disease, stage 3a: Secondary | ICD-10-CM | POA: Diagnosis not present

## 2020-03-27 DIAGNOSIS — I129 Hypertensive chronic kidney disease with stage 1 through stage 4 chronic kidney disease, or unspecified chronic kidney disease: Secondary | ICD-10-CM | POA: Diagnosis not present

## 2020-03-31 DIAGNOSIS — I129 Hypertensive chronic kidney disease with stage 1 through stage 4 chronic kidney disease, or unspecified chronic kidney disease: Secondary | ICD-10-CM | POA: Diagnosis not present

## 2020-03-31 DIAGNOSIS — E559 Vitamin D deficiency, unspecified: Secondary | ICD-10-CM | POA: Diagnosis not present

## 2020-03-31 DIAGNOSIS — R911 Solitary pulmonary nodule: Secondary | ICD-10-CM | POA: Diagnosis not present

## 2020-03-31 DIAGNOSIS — Z87440 Personal history of urinary (tract) infections: Secondary | ICD-10-CM | POA: Diagnosis not present

## 2020-03-31 DIAGNOSIS — Z982 Presence of cerebrospinal fluid drainage device: Secondary | ICD-10-CM | POA: Diagnosis not present

## 2020-03-31 DIAGNOSIS — C8590 Non-Hodgkin lymphoma, unspecified, unspecified site: Secondary | ICD-10-CM | POA: Diagnosis not present

## 2020-03-31 DIAGNOSIS — E039 Hypothyroidism, unspecified: Secondary | ICD-10-CM | POA: Diagnosis not present

## 2020-03-31 DIAGNOSIS — N1831 Chronic kidney disease, stage 3a: Secondary | ICD-10-CM | POA: Diagnosis not present

## 2020-03-31 DIAGNOSIS — I69811 Memory deficit following other cerebrovascular disease: Secondary | ICD-10-CM | POA: Diagnosis not present

## 2020-03-31 DIAGNOSIS — Z8619 Personal history of other infectious and parasitic diseases: Secondary | ICD-10-CM | POA: Diagnosis not present

## 2020-03-31 DIAGNOSIS — G912 (Idiopathic) normal pressure hydrocephalus: Secondary | ICD-10-CM | POA: Diagnosis not present

## 2020-03-31 DIAGNOSIS — D631 Anemia in chronic kidney disease: Secondary | ICD-10-CM | POA: Diagnosis not present

## 2020-04-02 DIAGNOSIS — I739 Peripheral vascular disease, unspecified: Secondary | ICD-10-CM | POA: Diagnosis not present

## 2020-04-06 DIAGNOSIS — D631 Anemia in chronic kidney disease: Secondary | ICD-10-CM | POA: Diagnosis not present

## 2020-04-06 DIAGNOSIS — R911 Solitary pulmonary nodule: Secondary | ICD-10-CM | POA: Diagnosis not present

## 2020-04-06 DIAGNOSIS — I69811 Memory deficit following other cerebrovascular disease: Secondary | ICD-10-CM | POA: Diagnosis not present

## 2020-04-06 DIAGNOSIS — E039 Hypothyroidism, unspecified: Secondary | ICD-10-CM | POA: Diagnosis not present

## 2020-04-06 DIAGNOSIS — I129 Hypertensive chronic kidney disease with stage 1 through stage 4 chronic kidney disease, or unspecified chronic kidney disease: Secondary | ICD-10-CM | POA: Diagnosis not present

## 2020-04-06 DIAGNOSIS — Z8619 Personal history of other infectious and parasitic diseases: Secondary | ICD-10-CM | POA: Diagnosis not present

## 2020-04-06 DIAGNOSIS — N1831 Chronic kidney disease, stage 3a: Secondary | ICD-10-CM | POA: Diagnosis not present

## 2020-04-06 DIAGNOSIS — Z87440 Personal history of urinary (tract) infections: Secondary | ICD-10-CM | POA: Diagnosis not present

## 2020-04-06 DIAGNOSIS — E559 Vitamin D deficiency, unspecified: Secondary | ICD-10-CM | POA: Diagnosis not present

## 2020-04-06 DIAGNOSIS — G912 (Idiopathic) normal pressure hydrocephalus: Secondary | ICD-10-CM | POA: Diagnosis not present

## 2020-04-06 DIAGNOSIS — Z982 Presence of cerebrospinal fluid drainage device: Secondary | ICD-10-CM | POA: Diagnosis not present

## 2020-04-06 DIAGNOSIS — C8590 Non-Hodgkin lymphoma, unspecified, unspecified site: Secondary | ICD-10-CM | POA: Diagnosis not present

## 2020-04-16 DIAGNOSIS — C8590 Non-Hodgkin lymphoma, unspecified, unspecified site: Secondary | ICD-10-CM | POA: Diagnosis not present

## 2020-04-16 DIAGNOSIS — R911 Solitary pulmonary nodule: Secondary | ICD-10-CM | POA: Diagnosis not present

## 2020-04-16 DIAGNOSIS — D631 Anemia in chronic kidney disease: Secondary | ICD-10-CM | POA: Diagnosis not present

## 2020-04-16 DIAGNOSIS — E559 Vitamin D deficiency, unspecified: Secondary | ICD-10-CM | POA: Diagnosis not present

## 2020-04-16 DIAGNOSIS — Z8619 Personal history of other infectious and parasitic diseases: Secondary | ICD-10-CM | POA: Diagnosis not present

## 2020-04-16 DIAGNOSIS — E039 Hypothyroidism, unspecified: Secondary | ICD-10-CM | POA: Diagnosis not present

## 2020-04-16 DIAGNOSIS — Z87440 Personal history of urinary (tract) infections: Secondary | ICD-10-CM | POA: Diagnosis not present

## 2020-04-16 DIAGNOSIS — N1831 Chronic kidney disease, stage 3a: Secondary | ICD-10-CM | POA: Diagnosis not present

## 2020-04-16 DIAGNOSIS — I129 Hypertensive chronic kidney disease with stage 1 through stage 4 chronic kidney disease, or unspecified chronic kidney disease: Secondary | ICD-10-CM | POA: Diagnosis not present

## 2020-04-16 DIAGNOSIS — G912 (Idiopathic) normal pressure hydrocephalus: Secondary | ICD-10-CM | POA: Diagnosis not present

## 2020-04-16 DIAGNOSIS — Z982 Presence of cerebrospinal fluid drainage device: Secondary | ICD-10-CM | POA: Diagnosis not present

## 2020-04-16 DIAGNOSIS — I69811 Memory deficit following other cerebrovascular disease: Secondary | ICD-10-CM | POA: Diagnosis not present

## 2020-04-21 DIAGNOSIS — C8580 Other specified types of non-Hodgkin lymphoma, unspecified site: Secondary | ICD-10-CM | POA: Diagnosis not present

## 2020-04-21 DIAGNOSIS — Z79899 Other long term (current) drug therapy: Secondary | ICD-10-CM | POA: Diagnosis not present

## 2020-04-24 DIAGNOSIS — Z982 Presence of cerebrospinal fluid drainage device: Secondary | ICD-10-CM | POA: Diagnosis not present

## 2020-04-24 DIAGNOSIS — Z87440 Personal history of urinary (tract) infections: Secondary | ICD-10-CM | POA: Diagnosis not present

## 2020-04-24 DIAGNOSIS — E559 Vitamin D deficiency, unspecified: Secondary | ICD-10-CM | POA: Diagnosis not present

## 2020-04-24 DIAGNOSIS — G912 (Idiopathic) normal pressure hydrocephalus: Secondary | ICD-10-CM | POA: Diagnosis not present

## 2020-04-24 DIAGNOSIS — I129 Hypertensive chronic kidney disease with stage 1 through stage 4 chronic kidney disease, or unspecified chronic kidney disease: Secondary | ICD-10-CM | POA: Diagnosis not present

## 2020-04-24 DIAGNOSIS — I69811 Memory deficit following other cerebrovascular disease: Secondary | ICD-10-CM | POA: Diagnosis not present

## 2020-04-24 DIAGNOSIS — R911 Solitary pulmonary nodule: Secondary | ICD-10-CM | POA: Diagnosis not present

## 2020-04-24 DIAGNOSIS — N1831 Chronic kidney disease, stage 3a: Secondary | ICD-10-CM | POA: Diagnosis not present

## 2020-04-24 DIAGNOSIS — E039 Hypothyroidism, unspecified: Secondary | ICD-10-CM | POA: Diagnosis not present

## 2020-04-24 DIAGNOSIS — D631 Anemia in chronic kidney disease: Secondary | ICD-10-CM | POA: Diagnosis not present

## 2020-04-24 DIAGNOSIS — C8590 Non-Hodgkin lymphoma, unspecified, unspecified site: Secondary | ICD-10-CM | POA: Diagnosis not present

## 2020-04-24 DIAGNOSIS — Z8619 Personal history of other infectious and parasitic diseases: Secondary | ICD-10-CM | POA: Diagnosis not present

## 2020-04-27 DIAGNOSIS — E876 Hypokalemia: Secondary | ICD-10-CM | POA: Diagnosis not present

## 2020-04-27 DIAGNOSIS — I10 Essential (primary) hypertension: Secondary | ICD-10-CM | POA: Diagnosis not present

## 2020-04-27 DIAGNOSIS — N1831 Chronic kidney disease, stage 3a: Secondary | ICD-10-CM | POA: Diagnosis not present

## 2020-04-29 DIAGNOSIS — Z87440 Personal history of urinary (tract) infections: Secondary | ICD-10-CM | POA: Diagnosis not present

## 2020-04-29 DIAGNOSIS — Z8619 Personal history of other infectious and parasitic diseases: Secondary | ICD-10-CM | POA: Diagnosis not present

## 2020-04-29 DIAGNOSIS — Z982 Presence of cerebrospinal fluid drainage device: Secondary | ICD-10-CM | POA: Diagnosis not present

## 2020-04-29 DIAGNOSIS — I129 Hypertensive chronic kidney disease with stage 1 through stage 4 chronic kidney disease, or unspecified chronic kidney disease: Secondary | ICD-10-CM | POA: Diagnosis not present

## 2020-04-29 DIAGNOSIS — D631 Anemia in chronic kidney disease: Secondary | ICD-10-CM | POA: Diagnosis not present

## 2020-04-29 DIAGNOSIS — G912 (Idiopathic) normal pressure hydrocephalus: Secondary | ICD-10-CM | POA: Diagnosis not present

## 2020-04-29 DIAGNOSIS — E039 Hypothyroidism, unspecified: Secondary | ICD-10-CM | POA: Diagnosis not present

## 2020-04-29 DIAGNOSIS — E559 Vitamin D deficiency, unspecified: Secondary | ICD-10-CM | POA: Diagnosis not present

## 2020-04-29 DIAGNOSIS — I69811 Memory deficit following other cerebrovascular disease: Secondary | ICD-10-CM | POA: Diagnosis not present

## 2020-04-29 DIAGNOSIS — C8590 Non-Hodgkin lymphoma, unspecified, unspecified site: Secondary | ICD-10-CM | POA: Diagnosis not present

## 2020-04-29 DIAGNOSIS — R911 Solitary pulmonary nodule: Secondary | ICD-10-CM | POA: Diagnosis not present

## 2020-04-29 DIAGNOSIS — N1831 Chronic kidney disease, stage 3a: Secondary | ICD-10-CM | POA: Diagnosis not present

## 2020-05-03 DIAGNOSIS — I739 Peripheral vascular disease, unspecified: Secondary | ICD-10-CM | POA: Diagnosis not present

## 2020-06-02 DIAGNOSIS — I739 Peripheral vascular disease, unspecified: Secondary | ICD-10-CM | POA: Diagnosis not present

## 2020-07-03 DIAGNOSIS — I739 Peripheral vascular disease, unspecified: Secondary | ICD-10-CM | POA: Diagnosis not present

## 2020-08-02 DIAGNOSIS — I739 Peripheral vascular disease, unspecified: Secondary | ICD-10-CM | POA: Diagnosis not present

## 2020-09-08 DIAGNOSIS — N3000 Acute cystitis without hematuria: Secondary | ICD-10-CM | POA: Diagnosis not present

## 2020-09-11 DIAGNOSIS — Z79899 Other long term (current) drug therapy: Secondary | ICD-10-CM | POA: Diagnosis not present

## 2020-09-11 DIAGNOSIS — Z9181 History of falling: Secondary | ICD-10-CM | POA: Diagnosis not present

## 2020-09-11 DIAGNOSIS — W19XXXA Unspecified fall, initial encounter: Secondary | ICD-10-CM | POA: Diagnosis not present

## 2020-09-11 DIAGNOSIS — N3000 Acute cystitis without hematuria: Secondary | ICD-10-CM | POA: Diagnosis not present

## 2020-09-11 DIAGNOSIS — N183 Chronic kidney disease, stage 3 unspecified: Secondary | ICD-10-CM | POA: Diagnosis not present

## 2020-09-14 DIAGNOSIS — N3000 Acute cystitis without hematuria: Secondary | ICD-10-CM | POA: Diagnosis not present

## 2020-10-19 DIAGNOSIS — R269 Unspecified abnormalities of gait and mobility: Secondary | ICD-10-CM | POA: Diagnosis not present

## 2020-10-19 DIAGNOSIS — G912 (Idiopathic) normal pressure hydrocephalus: Secondary | ICD-10-CM | POA: Diagnosis not present

## 2020-11-12 DIAGNOSIS — N3000 Acute cystitis without hematuria: Secondary | ICD-10-CM | POA: Diagnosis not present

## 2020-12-27 DIAGNOSIS — I129 Hypertensive chronic kidney disease with stage 1 through stage 4 chronic kidney disease, or unspecified chronic kidney disease: Secondary | ICD-10-CM | POA: Diagnosis not present

## 2020-12-27 DIAGNOSIS — C8519 Unspecified B-cell lymphoma, extranodal and solid organ sites: Secondary | ICD-10-CM | POA: Diagnosis not present

## 2020-12-27 DIAGNOSIS — N183 Chronic kidney disease, stage 3 unspecified: Secondary | ICD-10-CM | POA: Diagnosis not present

## 2020-12-27 DIAGNOSIS — T8501XA Breakdown (mechanical) of ventricular intracranial (communicating) shunt, initial encounter: Secondary | ICD-10-CM | POA: Diagnosis not present

## 2021-02-25 DIAGNOSIS — N183 Chronic kidney disease, stage 3 unspecified: Secondary | ICD-10-CM | POA: Diagnosis not present

## 2021-02-25 DIAGNOSIS — T8501XD Breakdown (mechanical) of ventricular intracranial (communicating) shunt, subsequent encounter: Secondary | ICD-10-CM | POA: Diagnosis not present

## 2021-02-25 DIAGNOSIS — C8519 Unspecified B-cell lymphoma, extranodal and solid organ sites: Secondary | ICD-10-CM | POA: Diagnosis not present

## 2021-02-25 DIAGNOSIS — I129 Hypertensive chronic kidney disease with stage 1 through stage 4 chronic kidney disease, or unspecified chronic kidney disease: Secondary | ICD-10-CM | POA: Diagnosis not present

## 2021-02-26 DIAGNOSIS — Z982 Presence of cerebrospinal fluid drainage device: Secondary | ICD-10-CM | POA: Diagnosis not present

## 2021-02-26 DIAGNOSIS — G912 (Idiopathic) normal pressure hydrocephalus: Secondary | ICD-10-CM | POA: Diagnosis not present

## 2021-03-08 DIAGNOSIS — N390 Urinary tract infection, site not specified: Secondary | ICD-10-CM | POA: Diagnosis not present

## 2021-03-08 DIAGNOSIS — R310 Gross hematuria: Secondary | ICD-10-CM | POA: Diagnosis not present

## 2021-03-12 ENCOUNTER — Other Ambulatory Visit (HOSPITAL_COMMUNITY)
Admission: RE | Admit: 2021-03-12 | Discharge: 2021-03-12 | Disposition: A | Discharge status: Home / Self Care | Payer: Medicare Other | Source: Other Acute Inpatient Hospital | Attending: Internal Medicine | Admitting: Internal Medicine

## 2021-03-12 DIAGNOSIS — R530 Neoplastic (malignant) related fatigue: Secondary | ICD-10-CM | POA: Insufficient documentation

## 2021-03-12 DIAGNOSIS — C8580 Other specified types of non-Hodgkin lymphoma, unspecified site: Secondary | ICD-10-CM | POA: Diagnosis not present

## 2021-03-12 DIAGNOSIS — N183 Chronic kidney disease, stage 3 unspecified: Secondary | ICD-10-CM | POA: Diagnosis not present

## 2021-03-12 LAB — CBC WITH DIFFERENTIAL/PLATELET
Abs Immature Granulocytes: 0.02 10*3/uL (ref 0.00–0.07)
Basophils Absolute: 0 10*3/uL (ref 0.0–0.1)
Basophils Relative: 1 %
Eosinophils Absolute: 0.4 10*3/uL (ref 0.0–0.5)
Eosinophils Relative: 7 %
HCT: 44 % (ref 39.0–52.0)
Hemoglobin: 15.2 g/dL (ref 13.0–17.0)
Immature Granulocytes: 0 %
Lymphocytes Relative: 24 %
Lymphs Abs: 1.4 10*3/uL (ref 0.7–4.0)
MCH: 32.3 pg (ref 26.0–34.0)
MCHC: 34.5 g/dL (ref 30.0–36.0)
MCV: 93.6 fL (ref 80.0–100.0)
Monocytes Absolute: 0.7 10*3/uL (ref 0.1–1.0)
Monocytes Relative: 12 %
Neutro Abs: 3.4 10*3/uL (ref 1.7–7.7)
Neutrophils Relative %: 56 %
Platelets: 255 10*3/uL (ref 150–400)
RBC: 4.7 MIL/uL (ref 4.22–5.81)
RDW: 13.2 % (ref 11.5–15.5)
WBC: 5.9 10*3/uL (ref 4.0–10.5)
nRBC: 0 % (ref 0.0–0.2)

## 2021-03-12 LAB — COMPREHENSIVE METABOLIC PANEL
ALT: <5 U/L (ref 0–44)
AST: 23 U/L (ref 15–41)
Albumin: 3.6 g/dL (ref 3.5–5.0)
Alkaline Phosphatase: 111 U/L (ref 38–126)
Anion gap: 9 (ref 5–15)
BUN: 31 mg/dL — ABNORMAL HIGH (ref 8–23)
CO2: 24 mmol/L (ref 22–32)
Calcium: 9.1 mg/dL (ref 8.9–10.3)
Chloride: 104 mmol/L (ref 98–111)
Creatinine, Ser: 1.6 mg/dL — ABNORMAL HIGH (ref 0.61–1.24)
GFR, Estimated: 45 mL/min — ABNORMAL LOW (ref >60)
Glucose, Bld: 90 mg/dL (ref 70–99)
Potassium: 3.1 mmol/L — ABNORMAL LOW (ref 3.5–5.1)
Sodium: 137 mmol/L (ref 135–145)
Total Bilirubin: 0.6 mg/dL (ref 0.3–1.2)
Total Protein: 7.6 g/dL (ref 6.5–8.1)

## 2021-04-27 DIAGNOSIS — M25561 Pain in right knee: Secondary | ICD-10-CM | POA: Diagnosis not present

## 2021-04-27 DIAGNOSIS — I7300 Raynaud's syndrome without gangrene: Secondary | ICD-10-CM | POA: Diagnosis not present

## 2021-04-27 DIAGNOSIS — L89310 Pressure ulcer of right buttock, unstageable: Secondary | ICD-10-CM | POA: Diagnosis not present

## 2021-05-21 DIAGNOSIS — G43909 Migraine, unspecified, not intractable, without status migrainosus: Secondary | ICD-10-CM | POA: Diagnosis not present

## 2021-07-19 DIAGNOSIS — Z23 Encounter for immunization: Secondary | ICD-10-CM | POA: Diagnosis not present

## 2021-08-13 DIAGNOSIS — N183 Chronic kidney disease, stage 3 unspecified: Secondary | ICD-10-CM | POA: Diagnosis not present

## 2021-08-13 DIAGNOSIS — I10 Essential (primary) hypertension: Secondary | ICD-10-CM | POA: Diagnosis not present

## 2021-08-17 DIAGNOSIS — G20 Parkinson's disease: Secondary | ICD-10-CM | POA: Diagnosis not present

## 2021-09-01 DIAGNOSIS — I10 Essential (primary) hypertension: Secondary | ICD-10-CM | POA: Diagnosis not present

## 2021-09-01 DIAGNOSIS — I7300 Raynaud's syndrome without gangrene: Secondary | ICD-10-CM | POA: Diagnosis not present

## 2021-09-01 DIAGNOSIS — Z01812 Encounter for preprocedural laboratory examination: Secondary | ICD-10-CM | POA: Diagnosis not present

## 2021-09-01 DIAGNOSIS — E876 Hypokalemia: Secondary | ICD-10-CM | POA: Diagnosis not present

## 2021-09-01 DIAGNOSIS — N1832 Chronic kidney disease, stage 3b: Secondary | ICD-10-CM | POA: Diagnosis not present

## 2021-10-13 DIAGNOSIS — M1389 Other specified arthritis, multiple sites: Secondary | ICD-10-CM | POA: Diagnosis not present

## 2021-10-13 DIAGNOSIS — I10 Essential (primary) hypertension: Secondary | ICD-10-CM | POA: Diagnosis not present

## 2021-10-13 DIAGNOSIS — N1832 Chronic kidney disease, stage 3b: Secondary | ICD-10-CM | POA: Diagnosis not present

## 2021-10-13 DIAGNOSIS — I7300 Raynaud's syndrome without gangrene: Secondary | ICD-10-CM | POA: Diagnosis not present

## 2021-11-04 DIAGNOSIS — I10 Essential (primary) hypertension: Secondary | ICD-10-CM | POA: Diagnosis not present

## 2021-11-04 DIAGNOSIS — I7300 Raynaud's syndrome without gangrene: Secondary | ICD-10-CM | POA: Diagnosis not present

## 2021-12-22 DIAGNOSIS — N3000 Acute cystitis without hematuria: Secondary | ICD-10-CM | POA: Diagnosis not present

## 2021-12-27 ENCOUNTER — Other Ambulatory Visit (HOSPITAL_COMMUNITY): Payer: Self-pay | Admitting: Internal Medicine

## 2021-12-27 ENCOUNTER — Other Ambulatory Visit: Payer: Self-pay | Admitting: Internal Medicine

## 2021-12-27 DIAGNOSIS — N390 Urinary tract infection, site not specified: Secondary | ICD-10-CM

## 2021-12-27 DIAGNOSIS — N139 Obstructive and reflux uropathy, unspecified: Secondary | ICD-10-CM

## 2021-12-28 ENCOUNTER — Ambulatory Visit (HOSPITAL_COMMUNITY)
Admission: RE | Admit: 2021-12-28 | Discharge: 2021-12-28 | Disposition: A | Discharge status: Home / Self Care | Payer: Medicare Other | Source: Ambulatory Visit | Attending: Internal Medicine | Admitting: Internal Medicine

## 2021-12-28 DIAGNOSIS — N1330 Unspecified hydronephrosis: Secondary | ICD-10-CM | POA: Diagnosis not present

## 2021-12-28 DIAGNOSIS — N390 Urinary tract infection, site not specified: Secondary | ICD-10-CM | POA: Diagnosis not present

## 2021-12-28 DIAGNOSIS — N139 Obstructive and reflux uropathy, unspecified: Secondary | ICD-10-CM | POA: Insufficient documentation

## 2021-12-28 DIAGNOSIS — I7300 Raynaud's syndrome without gangrene: Secondary | ICD-10-CM | POA: Diagnosis not present

## 2021-12-28 DIAGNOSIS — Z79899 Other long term (current) drug therapy: Secondary | ICD-10-CM | POA: Diagnosis not present

## 2021-12-28 DIAGNOSIS — I10 Essential (primary) hypertension: Secondary | ICD-10-CM | POA: Diagnosis not present

## 2021-12-29 IMAGING — CT CT ANGIO CHEST
2 of 7 series · 17 of 46 positions shown · IV contrast (Omnipaque or Isovue)
Comparison: Chest CT 10/11/2019, radiograph earlier this day.

CLINICAL DATA: PE suspected, high prob

Chest pain. Diagnosed with COVID 2 weeks ago.
EXAM:
CT ANGIOGRAPHY CHEST WITH CONTRAST
TECHNIQUE: Multidetector CT imaging of the chest was performed using the
standard protocol during bolus administration of intravenous
contrast. Multiplanar CT image reconstructions and MIPs were
obtained to evaluate the vascular anatomy.
CONTRAST:  100mL OMNIPAQUE IOHEXOL 350 MG/ML SOLN

[Series 7: pe axial thins · axial · 0.72mm/px · z∈[+1172,+1446]mm · 14 of 312 slices shown]
[im 19/312  lung]
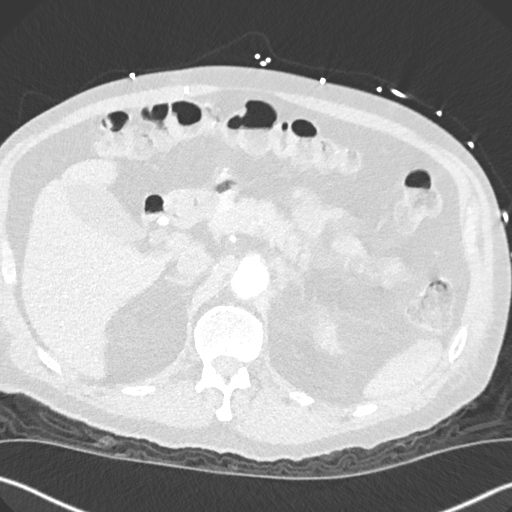
[im 37/312  soft-tissue]
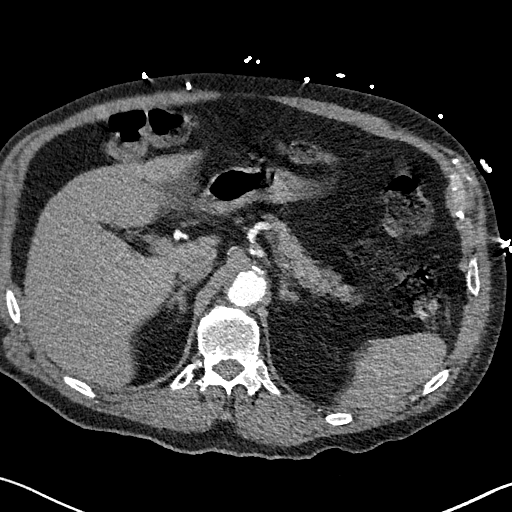
[im 55/312  lung]
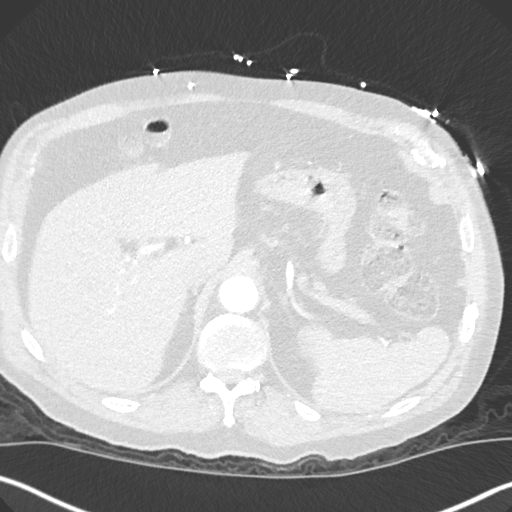
[im 92/312  soft-tissue]
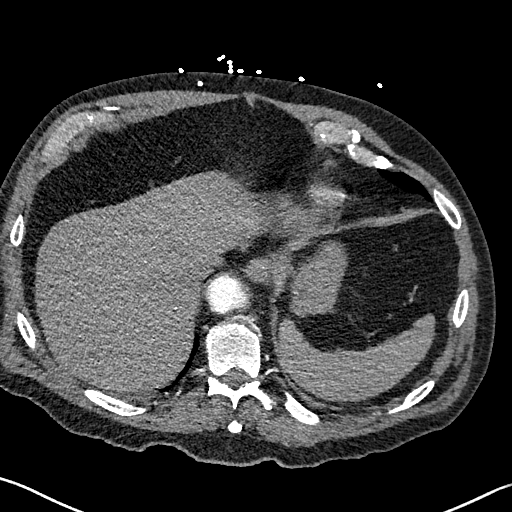
[im 110/312  lung]
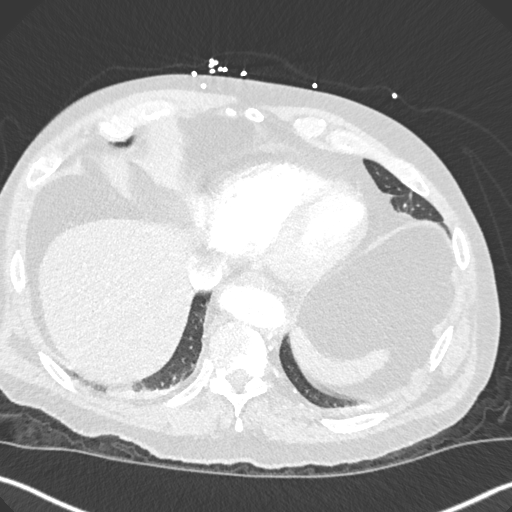
[im 129/312  soft-tissue]
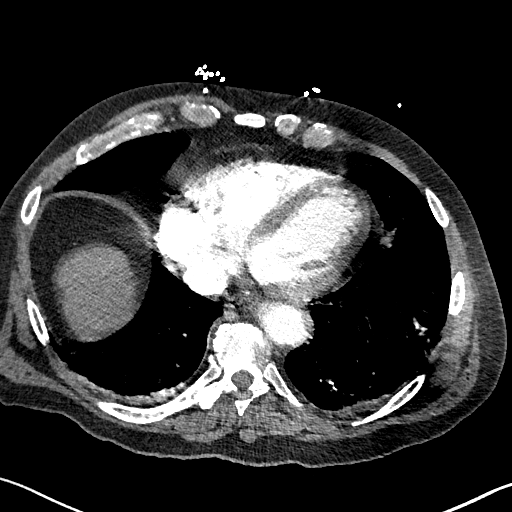
[im 147/312  lung]
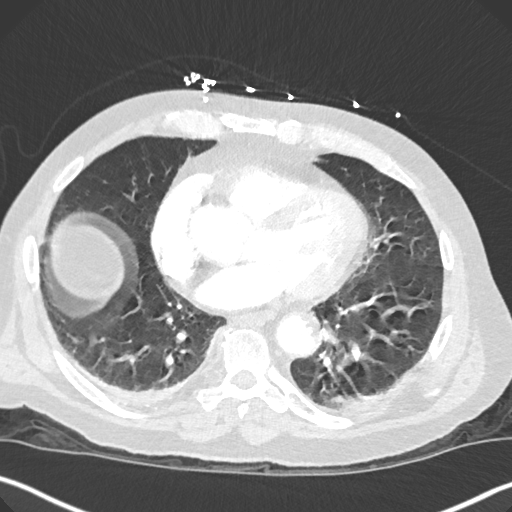
[im 165/312  soft-tissue]
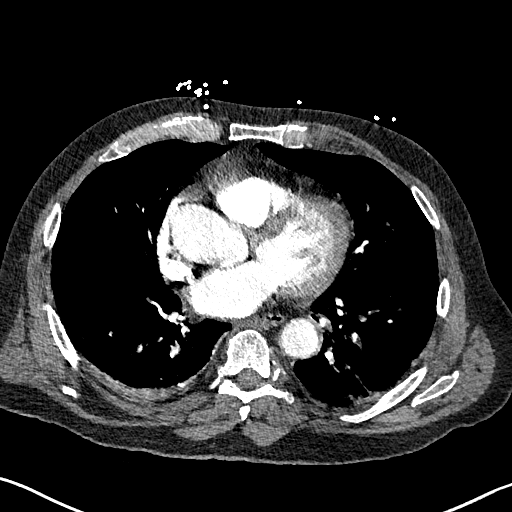
[im 183/312  lung]
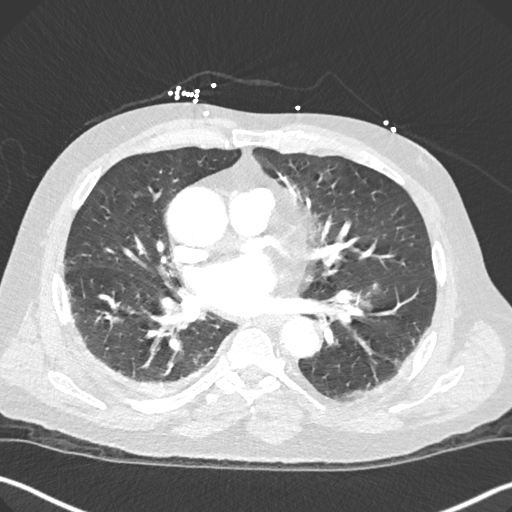
[im 202/312  soft-tissue]
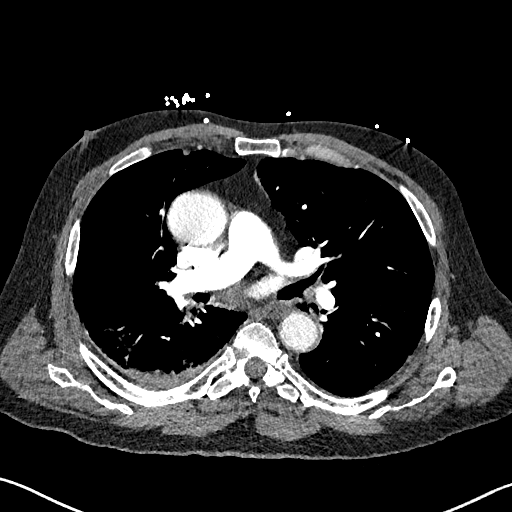
[im 238/312  lung]
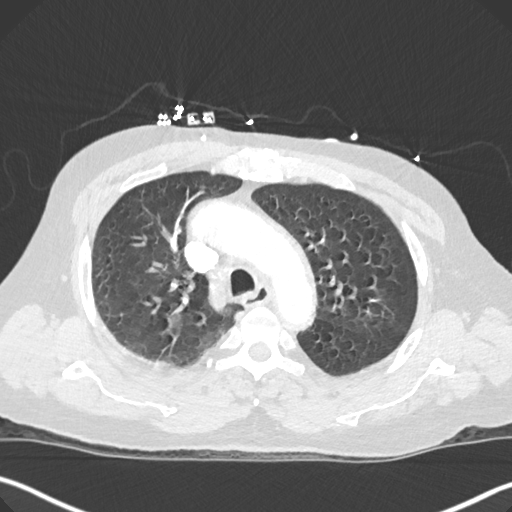
[im 257/312  soft-tissue]
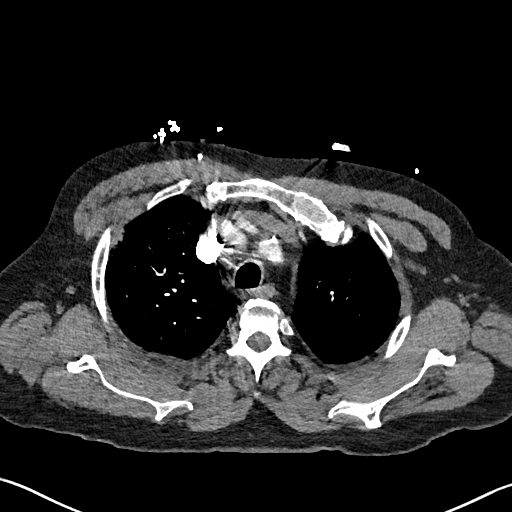
[im 275/312  lung]
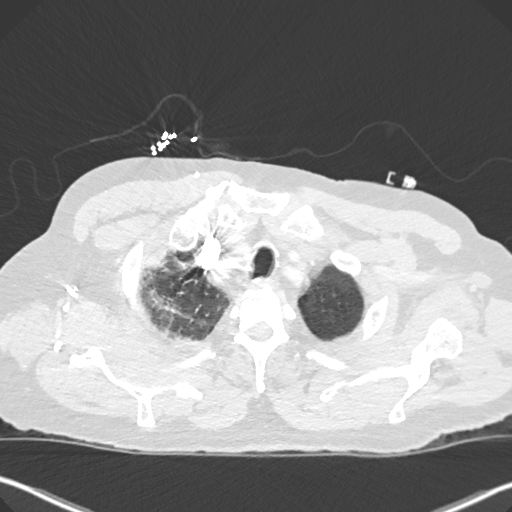
[im 293/312  soft-tissue]
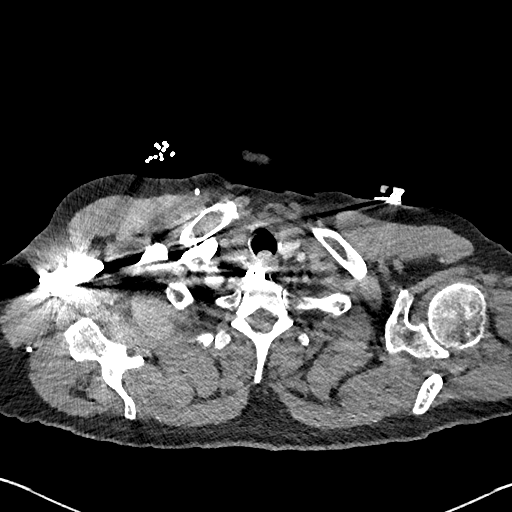

[Series 9: cor soft · coronal · 0.63mm/px · 3 of 147 slices shown]
[im 37/147  soft-tissue]
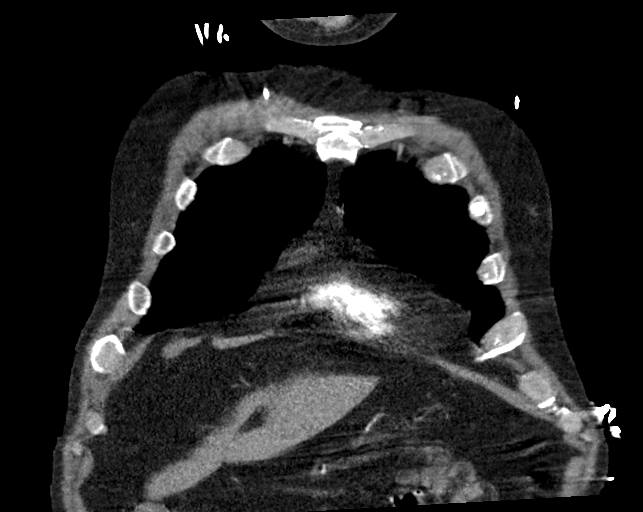
[im 74/147  soft-tissue]
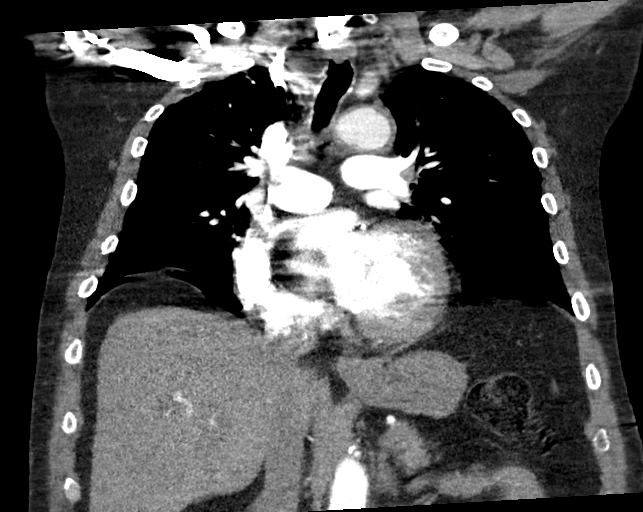
[im 110/147  soft-tissue]
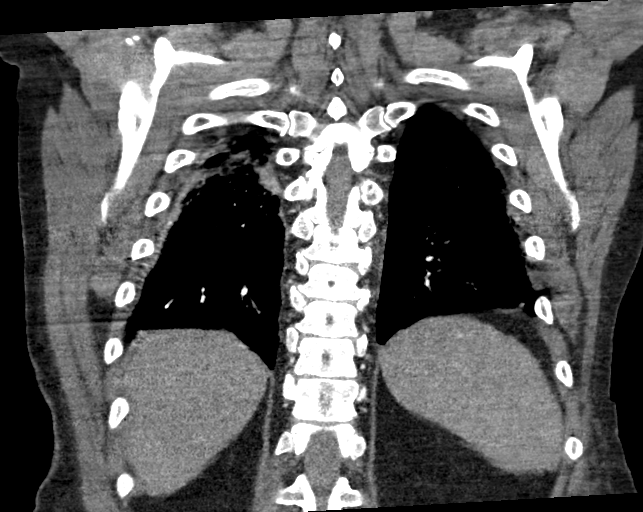

[17 of 46 positions shown; findings below may reference images not displayed]

FINDINGS: Cardiovascular: Motion limited evaluation for pulmonary embolus.
Apices and basilar evaluation is particularly limited. Allowing for
limitations, no discrete filling defects in the pulmonary arteries
to suggest pulmonary embolus. Aortic atherosclerosis without aortic
dissection. The aorta is tortuous, ascending aorta is aneurysmal at
4.2 cm. Mild cardiomegaly with coronary artery calcifications.

Mediastinum/Nodes: Small mediastinal lymph nodes, all subcentimeter,
likely reactive. No enlarged hilar lymph nodes. No esophageal wall
thickening. Tiny hiatal hernia. No thyroid nodule.

Lungs/Pleura: Breathing motion artifact limits assessment.
Persistent but improved peripheral ground-glass opacities involving
the right greater than left lung. Mild dependent atelectasis with
mild pleural thickening, no discrete pleural effusion.

Upper Abdomen: Calcified hepatic granuloma. No acute upper abdominal
findings.

Musculoskeletal: There are no acute or suspicious osseous
abnormalities. Degenerative change in the spine. Postsurgical change
in the lower cervical spine, partially included. Intra-articular
bodies posterior to the right shoulder.

Review of the MIP images confirms the above findings.
IMPRESSION: 1. Motion limited exam. Allowing for motion limitations, no filling
defects in the pulmonary arteries to suggest pulmonary embolus.
2. Persistent but improved peripheral ground-glass opacities in the
right greater than left lung. Findings consistent with sequela of
COVID pneumonia. Mild dependent atelectasis.
3. Cardiomegaly with coronary artery calcifications.
4. Aortic tortuosity with use warm aneurysmal dilatation of the
ascending aorta, maximal dimension 4.2 cm. Recommend annual imaging
followup by CTA or MRA. This recommendation follows 9363
ACCF/AHA/AATS/ACR/ASA/SCA/WINERS/MANDRI/MYEONG/IRILIND Guidelines for the
Diagnosis and Management of Patients with Thoracic Aortic Disease.
Circulation. 9363; 121: [id number]. Aortic aneurysm NOS (FRKYP-[id number].Y)

Aortic Atherosclerosis ([id number].T).

## 2021-12-30 ENCOUNTER — Emergency Department (HOSPITAL_COMMUNITY): Payer: No Typology Code available for payment source

## 2021-12-30 ENCOUNTER — Encounter (HOSPITAL_COMMUNITY): Payer: Self-pay

## 2021-12-30 ENCOUNTER — Inpatient Hospital Stay (HOSPITAL_COMMUNITY)
Admission: EM | Admit: 2021-12-30 | Discharge: 2022-01-01 | DRG: 660 | Disposition: A | Discharge status: Home / Self Care | Payer: No Typology Code available for payment source | Attending: Internal Medicine | Admitting: Internal Medicine

## 2021-12-30 ENCOUNTER — Ambulatory Visit: Payer: No Typology Code available for payment source | Admitting: Physician Assistant

## 2021-12-30 ENCOUNTER — Inpatient Hospital Stay: Admit: 2021-12-30 | Payer: No Typology Code available for payment source | Admitting: Urology

## 2021-12-30 ENCOUNTER — Other Ambulatory Visit: Payer: Self-pay

## 2021-12-30 ENCOUNTER — Inpatient Hospital Stay (HOSPITAL_COMMUNITY): Payer: No Typology Code available for payment source | Admitting: Certified Registered Nurse Anesthetist

## 2021-12-30 ENCOUNTER — Encounter (HOSPITAL_COMMUNITY)
Admission: EM | Disposition: A | Discharge status: Home / Self Care | Payer: Self-pay | Source: Home / Self Care | Attending: Internal Medicine

## 2021-12-30 ENCOUNTER — Inpatient Hospital Stay (HOSPITAL_COMMUNITY): Payer: No Typology Code available for payment source

## 2021-12-30 DIAGNOSIS — F4310 Post-traumatic stress disorder, unspecified: Secondary | ICD-10-CM | POA: Diagnosis present

## 2021-12-30 DIAGNOSIS — Z87891 Personal history of nicotine dependence: Secondary | ICD-10-CM | POA: Diagnosis not present

## 2021-12-30 DIAGNOSIS — I2510 Atherosclerotic heart disease of native coronary artery without angina pectoris: Secondary | ICD-10-CM

## 2021-12-30 DIAGNOSIS — N1330 Unspecified hydronephrosis: Secondary | ICD-10-CM

## 2021-12-30 DIAGNOSIS — N179 Acute kidney failure, unspecified: Secondary | ICD-10-CM

## 2021-12-30 DIAGNOSIS — F0280 Dementia in other diseases classified elsewhere without behavioral disturbance: Secondary | ICD-10-CM | POA: Diagnosis present

## 2021-12-30 DIAGNOSIS — F419 Anxiety disorder, unspecified: Secondary | ICD-10-CM | POA: Diagnosis present

## 2021-12-30 DIAGNOSIS — G43909 Migraine, unspecified, not intractable, without status migrainosus: Secondary | ICD-10-CM | POA: Diagnosis present

## 2021-12-30 DIAGNOSIS — L89151 Pressure ulcer of sacral region, stage 1: Secondary | ICD-10-CM | POA: Diagnosis present

## 2021-12-30 DIAGNOSIS — I10 Essential (primary) hypertension: Secondary | ICD-10-CM

## 2021-12-30 DIAGNOSIS — Z91013 Allergy to seafood: Secondary | ICD-10-CM

## 2021-12-30 DIAGNOSIS — G3183 Dementia with Lewy bodies: Secondary | ICD-10-CM | POA: Diagnosis present

## 2021-12-30 DIAGNOSIS — Q6211 Congenital occlusion of ureteropelvic junction: Secondary | ICD-10-CM | POA: Diagnosis not present

## 2021-12-30 DIAGNOSIS — G20 Parkinson's disease: Secondary | ICD-10-CM | POA: Diagnosis present

## 2021-12-30 DIAGNOSIS — Z885 Allergy status to narcotic agent status: Secondary | ICD-10-CM | POA: Diagnosis not present

## 2021-12-30 DIAGNOSIS — B964 Proteus (mirabilis) (morganii) as the cause of diseases classified elsewhere: Secondary | ICD-10-CM | POA: Diagnosis not present

## 2021-12-30 DIAGNOSIS — L89311 Pressure ulcer of right buttock, stage 1: Secondary | ICD-10-CM | POA: Diagnosis present

## 2021-12-30 DIAGNOSIS — N400 Enlarged prostate without lower urinary tract symptoms: Secondary | ICD-10-CM | POA: Diagnosis present

## 2021-12-30 DIAGNOSIS — J439 Emphysema, unspecified: Secondary | ICD-10-CM | POA: Diagnosis present

## 2021-12-30 DIAGNOSIS — N261 Atrophy of kidney (terminal): Secondary | ICD-10-CM | POA: Diagnosis present

## 2021-12-30 DIAGNOSIS — Z79899 Other long term (current) drug therapy: Secondary | ICD-10-CM

## 2021-12-30 DIAGNOSIS — C8590 Non-Hodgkin lymphoma, unspecified, unspecified site: Secondary | ICD-10-CM | POA: Diagnosis present

## 2021-12-30 DIAGNOSIS — N136 Pyonephrosis: Secondary | ICD-10-CM | POA: Diagnosis present

## 2021-12-30 DIAGNOSIS — Z91030 Bee allergy status: Secondary | ICD-10-CM

## 2021-12-30 DIAGNOSIS — F32A Depression, unspecified: Secondary | ICD-10-CM | POA: Diagnosis present

## 2021-12-30 DIAGNOSIS — N170 Acute kidney failure with tubular necrosis: Secondary | ICD-10-CM | POA: Diagnosis not present

## 2021-12-30 DIAGNOSIS — N132 Hydronephrosis with renal and ureteral calculous obstruction: Secondary | ICD-10-CM | POA: Diagnosis not present

## 2021-12-30 DIAGNOSIS — Z66 Do not resuscitate: Secondary | ICD-10-CM | POA: Diagnosis present

## 2021-12-30 DIAGNOSIS — Z96642 Presence of left artificial hip joint: Secondary | ICD-10-CM | POA: Diagnosis present

## 2021-12-30 DIAGNOSIS — G20A1 Parkinson's disease without dyskinesia, without mention of fluctuations: Secondary | ICD-10-CM | POA: Diagnosis present

## 2021-12-30 HISTORY — PX: CYSTOSCOPY W/ URETERAL STENT PLACEMENT: SHX1429

## 2021-12-30 LAB — CBC
HCT: 38.6 % — ABNORMAL LOW (ref 39.0–52.0)
Hemoglobin: 12.7 g/dL — ABNORMAL LOW (ref 13.0–17.0)
MCH: 31.7 pg (ref 26.0–34.0)
MCHC: 32.9 g/dL (ref 30.0–36.0)
MCV: 96.3 fL (ref 80.0–100.0)
Platelets: 237 10*3/uL (ref 150–400)
RBC: 4.01 MIL/uL — ABNORMAL LOW (ref 4.22–5.81)
RDW: 13.6 % (ref 11.5–15.5)
WBC: 5.9 10*3/uL (ref 4.0–10.5)
nRBC: 0 % (ref 0.0–0.2)

## 2021-12-30 LAB — BASIC METABOLIC PANEL
Anion gap: 9 (ref 5–15)
BUN: 47 mg/dL — ABNORMAL HIGH (ref 8–23)
CO2: 21 mmol/L — ABNORMAL LOW (ref 22–32)
Calcium: 9.3 mg/dL (ref 8.9–10.3)
Chloride: 110 mmol/L (ref 98–111)
Creatinine, Ser: 2.49 mg/dL — ABNORMAL HIGH (ref 0.61–1.24)
GFR, Estimated: 26 mL/min — ABNORMAL LOW (ref >60)
Glucose, Bld: 83 mg/dL (ref 70–99)
Potassium: 4 mmol/L (ref 3.5–5.1)
Sodium: 140 mmol/L (ref 135–145)

## 2021-12-30 SURGERY — CYSTOSCOPY, WITH RETROGRADE PYELOGRAM AND URETERAL STENT INSERTION
Anesthesia: General | Site: Ureter | Laterality: Left

## 2021-12-30 MED ORDER — OXYCODONE HCL 5 MG/5ML PO SOLN: 5.0000 mg | Freq: Once | ORAL | Status: DC | PRN

## 2021-12-30 MED ORDER — POLYETHYLENE GLYCOL 3350 17 G PO PACK: 17.0000 g | PACK | Freq: Every day | ORAL | Status: DC | PRN

## 2021-12-30 MED ORDER — FENTANYL CITRATE PF 50 MCG/ML IJ SOSY: 25.0000 ug | PREFILLED_SYRINGE | INTRAMUSCULAR | Status: DC | PRN

## 2021-12-30 MED ORDER — FENTANYL CITRATE (PF) 100 MCG/2ML IJ SOLN
INTRAMUSCULAR | Status: DC | PRN
  Administered 2021-12-30: 100 ug via INTRAVENOUS

## 2021-12-30 MED ORDER — ONDANSETRON HCL 4 MG/2ML IJ SOLN: 4.0000 mg | Freq: Four times a day (QID) | INTRAMUSCULAR | Status: DC | PRN

## 2021-12-30 MED ORDER — ACETAMINOPHEN 650 MG RE SUPP: 650.0000 mg | Freq: Four times a day (QID) | RECTAL | Status: DC | PRN

## 2021-12-30 MED ORDER — LACTATED RINGERS IV SOLN: INTRAVENOUS | Status: AC

## 2021-12-30 MED ORDER — FENTANYL CITRATE (PF) 100 MCG/2ML IJ SOLN
INTRAMUSCULAR | Status: AC
  Filled 2021-12-30: qty 2

## 2021-12-30 MED ORDER — SODIUM CHLORIDE 0.9 % IV SOLN
1.0000 g | INTRAVENOUS | Status: DC
  Administered 2021-12-30 – 2021-12-31 (×2): 1 g via INTRAVENOUS
  Filled 2021-12-30 (×2): qty 10

## 2021-12-30 MED ORDER — LACTATED RINGERS IV BOLUS
1000.0000 mL | Freq: Once | INTRAVENOUS | Status: AC
  Administered 2021-12-30: 1000 mL via INTRAVENOUS

## 2021-12-30 MED ORDER — OXYCODONE HCL 5 MG PO TABS: 5.0000 mg | ORAL_TABLET | Freq: Once | ORAL | Status: DC | PRN

## 2021-12-30 MED ORDER — ONDANSETRON HCL 4 MG/2ML IJ SOLN
INTRAMUSCULAR | Status: DC | PRN
  Administered 2021-12-30: 4 mg via INTRAVENOUS

## 2021-12-30 MED ORDER — STERILE WATER FOR IRRIGATION IR SOLN
Status: DC | PRN
  Administered 2021-12-30: 3000 mL

## 2021-12-30 MED ORDER — SUCCINYLCHOLINE CHLORIDE 200 MG/10ML IV SOSY
PREFILLED_SYRINGE | INTRAVENOUS | Status: DC | PRN
  Administered 2021-12-30: 100 mg via INTRAVENOUS

## 2021-12-30 MED ORDER — ALLOPURINOL 100 MG PO TABS
100.0000 mg | ORAL_TABLET | Freq: Every day | ORAL | Status: DC
  Administered 2021-12-31 – 2022-01-01 (×2): 100 mg via ORAL
  Filled 2021-12-30 (×2): qty 1

## 2021-12-30 MED ORDER — ONDANSETRON HCL 4 MG/2ML IJ SOLN: 4.0000 mg | Freq: Once | INTRAMUSCULAR | Status: DC | PRN

## 2021-12-30 MED ORDER — LIDOCAINE 2% (20 MG/ML) 5 ML SYRINGE
INTRAMUSCULAR | Status: DC | PRN
  Administered 2021-12-30: 60 mg via INTRAVENOUS

## 2021-12-30 MED ORDER — PROPOFOL 10 MG/ML IV BOLUS
INTRAVENOUS | Status: DC | PRN
  Administered 2021-12-30: 120 mg via INTRAVENOUS

## 2021-12-30 MED ORDER — ONDANSETRON HCL 4 MG PO TABS: 4.0000 mg | ORAL_TABLET | Freq: Four times a day (QID) | ORAL | Status: DC | PRN

## 2021-12-30 MED ORDER — ACETAMINOPHEN 325 MG PO TABS: 650.0000 mg | ORAL_TABLET | Freq: Four times a day (QID) | ORAL | Status: DC | PRN

## 2021-12-30 MED ORDER — QUETIAPINE FUMARATE 25 MG PO TABS
25.0000 mg | ORAL_TABLET | ORAL | Status: DC
Start: 2021-12-30 — End: 2022-01-01
  Administered 2021-12-30 – 2021-12-31 (×3): 25 mg via ORAL
  Filled 2021-12-30 (×3): qty 1

## 2021-12-30 MED ORDER — LACTATED RINGERS IV SOLN: INTRAVENOUS | Status: DC | PRN

## 2021-12-30 MED ORDER — CARBIDOPA-LEVODOPA 25-100 MG PO TABS
2.0000 | ORAL_TABLET | Freq: Three times a day (TID) | ORAL | Status: DC
  Administered 2021-12-31 – 2022-01-01 (×4): 2 via ORAL
  Filled 2021-12-30 (×4): qty 2

## 2021-12-30 MED ORDER — AMITRIPTYLINE HCL 25 MG PO TABS
50.0000 mg | ORAL_TABLET | Freq: Every day | ORAL | Status: DC
  Administered 2021-12-31: 50 mg via ORAL
  Filled 2021-12-30: qty 2

## 2021-12-30 MED ORDER — IOHEXOL 300 MG/ML  SOLN
INTRAMUSCULAR | Status: DC | PRN
  Administered 2021-12-30: 10 mL

## 2021-12-30 MED ORDER — DEXAMETHASONE SODIUM PHOSPHATE 10 MG/ML IJ SOLN
INTRAMUSCULAR | Status: DC | PRN
  Administered 2021-12-30: 4 mg via INTRAVENOUS

## 2021-12-30 MED ORDER — TOPIRAMATE 100 MG PO TABS
100.0000 mg | ORAL_TABLET | Freq: Every day | ORAL | Status: DC
  Administered 2021-12-30 – 2021-12-31 (×2): 100 mg via ORAL
  Filled 2021-12-30 (×2): qty 1

## 2021-12-30 SURGICAL SUPPLY — 14 items
BAG URO CATCHER STRL LF (MISCELLANEOUS) ×2 IMPLANT
CATH URETL OPEN 5X70 (CATHETERS) ×1 IMPLANT
CLOTH BEACON ORANGE TIMEOUT ST (SAFETY) ×2 IMPLANT
GLOVE BIOGEL M 7.0 STRL (GLOVE) ×2 IMPLANT
GOWN STRL REUS W/ TWL XL LVL3 (GOWN DISPOSABLE) ×1 IMPLANT
GOWN STRL REUS W/TWL XL LVL3 (GOWN DISPOSABLE) ×2
GUIDEWIRE STR DUAL SENSOR (WIRE) ×2 IMPLANT
GUIDEWIRE ZIPWRE .038 STRAIGHT (WIRE) IMPLANT
MANIFOLD NEPTUNE II (INSTRUMENTS) ×2 IMPLANT
PACK CYSTO (CUSTOM PROCEDURE TRAY) ×2 IMPLANT
STENT URET 6FRX26 CONTOUR (STENTS) ×1 IMPLANT
SYR 10ML LL (SYRINGE) ×2 IMPLANT
TUBING CONNECTING 10 (TUBING) ×2 IMPLANT
TUBING UROLOGY SET (TUBING) IMPLANT

## 2021-12-30 NOTE — H&P (Signed)
History and Physical    Albert Hahn DOB: 01-Nov-1945 DOA: 12/30/2021  PCP: Asencion Noble, MD   Patient coming from: Home  I have personally briefly reviewed patient's old medical records in [place]  Chief Complaint: Elevated creatinine  HPI: Albert Hahn is a 75 y.o. male with medical history significant for Lewy body dementia, Parkinson's disease, hypertension.  Patient was sent to the ED from primary care provider's office with reports of acute renal failure.  Spouse and daughter at bedside.  On my evaluation patient is awake and oriented to person and place, is able to answer simple questions, but his speech is intermittently confused -this is his baseline.   He was diagnosed with UTI 4 days ago and started on Keflex.  He has been compliant with this.  He saw his primary care provider and was referred to the ED due to elevated creatinine, ultrasound also showed severe left hydronephrosis.  ED Course: Temperature 97.5.  Stable vitals.  Creatinine 2.49.  BUN of 47.  CT showing severe chronic left-sided hydronephrosis consistent with chronic UPJ obstruction. EDP talked to Dr. Abner Greenspan, recommended admission to [facility], will see on arrival.  Review of Systems: As per HPI all other systems reviewed and negative.  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   PTSD (post-traumatic stress disorder)     Past Surgical History:  Procedure Laterality Date   BACK SURGERY     L3-L4 Fusion per pt   COLONOSCOPY W/ POLYPECTOMY     HERNIA REPAIR     HIP SURGERY     JOINT REPLACEMENT     LUMBAR PUNCTURE  07/23/2012   severe headaches   NECK SURGERY     metal plates and screws in neck    TOTAL HIP  ARTHROPLASTY     VENTRICULOPERITONEAL SHUNT N/A 11/25/2016   Procedure: SHUNT INSERTION VENTRICULAR-PERITONEAL;  Surgeon: Eustace Moore, MD;  Location: [facility];  Service: Neurosurgery;  Laterality: N/A;     reports that he has quit smoking. He has never used smokeless tobacco. He reports that he does not drink alcohol and does not use drugs.  Allergies  Allergen Reactions   Bee Venom Anaphylaxis   Shellfish Allergy Anaphylaxis   Codeine Nausea And Vomiting    Family History  Problem Relation Age of Onset   Healthy Mother    Healthy Father     Prior to Admission medications   Medication Sig Start Date End Date Taking? Authorizing Provider  acetaminophen (TYLENOL) 500 MG tablet Take 500-1,000 mg by mouth every 6 (six) hours as needed for mild pain or moderate pain.   Yes [provider]  allopurinol (ZYLOPRIM) 100 MG tablet Take 1 tablet by mouth daily. 08/11/21  Yes [provider]  amitriptyline (ELAVIL) 50 MG tablet Take 1.5 tablets (75 mg total) by mouth at bedtime. Patient taking differently: Take 50 mg by mouth at bedtime. 10/31/19  Yes Johnson, Clanford L, MD  amLODipine (NORVASC) 10 MG tablet Take 10 mg by mouth daily.   Yes [provider]  carbidopa-levodopa (SINEMET IR) 25-100 MG tablet Take 2 tablets by  mouth 3 (three) times daily. 03/13/21  Yes [provider]  Cephalexin 500 MG tablet Take 500 mg by mouth 3 (three) times daily. 12/27/21  Yes [provider]  hydrochlorothiazide (HYDRODIURIL) 25 MG tablet Take 0.5 tablets (12.5 mg total) by mouth daily. Patient taking differently: Take 25 mg by mouth daily. 10/31/19  Yes Johnson, Clanford L, MD  hydrocortisone cream 1 % Apply 1 application. topically as needed for itching.   Yes [provider]  Melatonin 3 MG CAPS Take 6 mg by mouth at bedtime as needed (sleep). 04/30/21  Yes [provider]  Polyethyl Glycol-Propyl Glycol 0.4-0.3 % SOLN Place 1-2 drops into both eyes  daily.   Yes [provider]  potassium chloride SA (KLOR-CON M) 20 MEQ tablet Take 40 mEq by mouth daily. 01/29/21  Yes [provider]  QUEtiapine (SEROQUEL) 25 MG tablet Take 25 mg by mouth See admin instructions. Take 1 tablet at 3 pm and 1 tablet at bedtime 04/30/21  Yes [provider]  topiramate (TOPAMAX) 100 MG tablet Take 100 mg by mouth daily. At bedtime   Yes [provider]  allopurinol (ZYLOPRIM) 100 MG tablet Take 100 mg by mouth daily.    [provider]  potassium chloride 20 MEQ/15ML (10%) SOLN Take 30 mLs (40 mEq total) by mouth daily for 7 doses. Patient not taking: Reported on 12/30/2021 11/01/19 11/08/19  Murlean Iba, MD    Physical Exam: Vitals:   12/30/21 1552 12/30/21 1553 12/30/21 1700 12/30/21 1807  BP: (!) 144/72  (!) 151/124 (!) 162/81  Pulse: 77  65 71  Resp: '17  18 20  '$ Temp: (!) 97.5 F (36.4 C)     TempSrc: Oral     SpO2: 100%  100% 100%  Weight:  85.3 kg    Height:  '5\' 5"'$  (1.651 m)      Constitutional: NAD, calm, comfortable Vitals:   12/30/21 1552 12/30/21 1553 12/30/21 1700 12/30/21 1807  BP: (!) 144/72  (!) 151/124 (!) 162/81  Pulse: 77  65 71  Resp: '17  18 20  '$ Temp: (!) 97.5 F (36.4 C)     TempSrc: Oral     SpO2: 100%  100% 100%  Weight:  85.3 kg    Height:  '5\' 5"'$  (1.651 m)     Eyes: PERRL, lids and conjunctivae normal ENMT: Mucous membranes are moist.   Neck: normal, supple, no masses, no thyromegaly Respiratory: clear to auscultation bilaterally, no wheezing, no crackles. Normal respiratory effort. No accessory muscle use.  Cardiovascular: Regular rate and rhythm, no murmurs / rubs / gallops.  Lower extremities warm. Abdomen: no tenderness, no masses palpated. No hepatosplenomegaly. Bowel sounds positive.  Musculoskeletal: no clubbing / cyanosis. No joint deformity upper and lower extremities. Good ROM, no contractures. Normal muscle tone.  Skin: no rashes, lesions, ulcers. No  induration Neurologic: Speech intermittently confused but fluent, no facial asymmetry, moving all extremities spontaneously Psychiatric: Following directions, normal judgment and insight. Alert and oriented to person and place. Normal mood.   Labs on Admission: I have personally reviewed following labs and imaging studies  CBC: Recent Labs  Lab 12/30/21 1615  WBC 5.9  HGB 12.7*  HCT 38.6*  MCV 96.3  PLT 829   Basic Metabolic Panel: Recent Labs  Lab 12/30/21 1615  NA 140  K 4.0  CL 110  CO2 21*  GLUCOSE 83  BUN 47*  CREATININE 2.49*  CALCIUM 9.3    Radiological Exams on Admission: CT  ABDOMEN PELVIS WO CONTRAST  Result Date: 12/30/2021 CLINICAL DATA:  Left flank and back pain. Severe left-sided hydronephrosis seen on recent ultrasound. EXAM: CT ABDOMEN AND PELVIS WITHOUT CONTRAST TECHNIQUE: Multidetector CT imaging of the abdomen and pelvis was performed following the standard protocol without IV contrast. RADIATION DOSE REDUCTION: This exam was performed according to the departmental dose-optimization program which includes automated exposure control, adjustment of the mA and/or kV according to patient size and/or use of iterative reconstruction technique. COMPARISON:  Renal ultrasound 12/28/2021 and prior CT scan 04/26/2017. FINDINGS: Lower chest: Moderate tortuosity of the thoracic aorta. Scattered atherosclerotic calcifications. Extensive three-vessel coronary artery calcifications. The lung bases clear of an acute process. No pleural effusions or pulmonary lesions. Hepatobiliary: No hepatic lesions are without contrast. Small calcified granuloma noted in the right hepatic lobe is stable. The gallbladder is unremarkable. No common bile duct dilatation. Pancreas: No mass, inflammation or ductal dilatation. Spleen: Normal size.  No focal lesions. Adrenals/Urinary Tract: Adrenal glands are normal. As demonstrated on the recent ultrasound examination there is severe left-sided  hydronephrosis with progressive renal cortical thinning/atrophy of the left kidney when compared to the prior CT scan. Findings are most consistent with a chronic UPJ obstruction. The mid distal ureter is normal caliber. No renal, ureteral or bladder calculi. Slightly prominent extra renal pelvis on the right side but no hydronephrosis. Stomach/Bowel: The stomach, duodenum, small bowel and colon are grossly normal without oral contrast. No inflammatory changes, mass lesions or obstructive findings. The appendix is normal. Vascular/Lymphatic: Moderate atherosclerotic calcifications involving the aorta and iliac arteries but no aneurysm. No mesenteric or retroperitoneal mass or adenopathy. Reproductive: Mild prostate gland enlargement. The seminal vesicles are unremarkable. Other: Ventriculoperitoneal from catheter noted in the abdomen. Musculoskeletal: No significant bony findings. Stable vertebral augmentation changes at L3. 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 IMPRESSION: 1. Chronic severe left-sided hydronephrosis with progressive renal cortical thinning/atrophy. 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 2. No renal, ureteral or bladder calculi or mass. 3. No acute abdominal/pelvic findings, mass lesions or adenopathy. 4. Extensive three-vessel coronary artery calcifications. Aortic Atherosclerosis (ICD10-I70.0). Electronically Signed   By: Marijo Sanes M.D.   On: 12/30/2021 17:21    EKG: none  Assessment/Plan Principal Problem:   Hydronephrosis of left kidney Active Problems:   AKI (acute kidney injury) ([place])   Non Hodgkin's lymphoma (HCC)   Parkinson's disease (HCC)   Lewy body dementia (HCC)    Assessment and Plan: * Hydronephrosis of left kidney CT today showing chronic severe left-sided hydronephrosis with progressive renal cortical thinning/atrophy consistent with chronic UPJ  obstruction.  Diagnosed with UTI 4 days and started on cephalexin which he has been compliant with. -IV ceftriaxone 1 g daily for now considering presence of hydronephrosis -UA and urine culture pending, unsure if urine cultures were obtained by outpatient provider prior to antibiotics - EDP talked to urologist Dr. Abner Greenspan, recommended admission to [facility], plans take patient to [facility]. -[facility] on arrival to [facility] -[facility]  AKI (acute kidney injury) ([place]) Creatinine 2.49, baseline 1.2-1.4.  With severe chronic left sided hydronephrosis. -1 L bolus given, continue LR 100cc/hr x 20hrs -Urology to see at [facility] long.  Lewy body dementia ([place]) Parkinson's with left body dementia.  Also and daughter at bedside, reports speech and mentation today is at baseline.  He answers some questions appropriately but speech intermittently confused.  Ambulates with a walker and sometimes wheelchair. -  Resume Sinemet, Seroquel, Elavil, Topamax in a.m.   DVT prophylaxis:  SCDs Code Status: Patient is DNR (confirmed with daughter and spouse at bedside), but this will be reversed for surgery Family Communication: Spouse and daughter at bedside Disposition Plan: ~ 2 days Consults called: Urology Admission status:  Inpt tele I certify that at the point of admission it is my clinical judgment that the patient will require inpatient hospital care spanning beyond 2 midnights from the point of admission due to high intensity of service, high risk for further deterioration and high frequency of surveillance required.   Author: Bethena Roys, MD 12/30/2021 8:36 PM  For on call review [email or web address].

## 2021-12-30 NOTE — ED Notes (Signed)
Two unsuccessful sticks attempted to establish IV.

## 2021-12-30 NOTE — Assessment & Plan Note (Signed)
Creatinine 2.49, baseline 1.2-1.4.  With severe chronic left sided hydronephrosis. -1 L bolus given, continue LR 100cc/hr x 20hrs -Urology to see at [facility] long.

## 2021-12-30 NOTE — Assessment & Plan Note (Signed)
Parkinson's with left body dementia.  Also and daughter at bedside, reports speech and mentation today is at baseline.  He answers some questions appropriately but speech intermittently confused.  Ambulates with a walker and sometimes wheelchair. -Resume Sinemet, Seroquel, Elavil, Topamax in a.m.

## 2021-12-30 NOTE — Anesthesia Procedure Notes (Signed)
Procedure Name: Intubation Date/Time: 12/30/2021 9:41 PM Performed by: Rosaland Lao, CRNA Pre-anesthesia Checklist: Patient identified, Emergency Drugs available, Suction available and Patient being monitored Patient Re-evaluated:Patient Re-evaluated prior to induction Oxygen Delivery Method: Circle system utilized Preoxygenation: Pre-oxygenation with 100% oxygen Induction Type: IV induction and Rapid sequence Laryngoscope Size: Mac and 4 Grade View: Grade I Tube type: Oral Tube size: 7.0 mm Number of attempts: 1 Airway Equipment and Method: Stylet Placement Confirmation: ETT inserted through vocal cords under direct vision, positive ETCO2 and breath sounds checked- equal and bilateral Secured at: 22 cm Tube secured with: Tape Dental Injury: Teeth and Oropharynx as per pre-operative assessment

## 2021-12-30 NOTE — ED Triage Notes (Signed)
Sent from pcp to ed for acute renal failure.  Reports that he has recently lost fuction in one of his kidneys.  Pt is alert and oriented.  Resp even and unlabored.  Skin warm and dry.  nad

## 2021-12-30 NOTE — Anesthesia Preprocedure Evaluation (Addendum)
Anesthesia Evaluation  Patient identified by MRN, date of birth, ID band Patient awake    Reviewed: Allergy & Precautions, NPO status , Patient's Chart, lab work & pertinent test results, reviewed documented beta blocker date and time   History of Anesthesia Complications (+) AWARENESS UNDER ANESTHESIA and history of anesthetic complications  Airway Mallampati: II  TM Distance: >3 FB Neck ROM: Full    Dental  (+) Caps, Dental Advisory Given, Missing   Pulmonary former smoker,    Pulmonary exam normal breath sounds clear to auscultation       Cardiovascular hypertension, Pt. on medications + CAD and + Peripheral Vascular Disease  Normal cardiovascular exam Rhythm:Regular Rate:Normal  EKG Junctional tachycardia, prolonged QT   Neuro/Psych  Headaches, PSYCHIATRIC DISORDERS Anxiety Depression Hx/o VP shunt 2018    GI/Hepatic hiatal hernia,   Endo/Other  Obesity  Renal/GU ARF and Renal InsufficiencyRenal diseaseLeft hydronephrosis Chronic UPJ obstruction ARF superimposed on chronic renal insufficiency  negative genitourinary   Musculoskeletal  (+) Arthritis , Osteoarthritis,    Abdominal (+) + obese,   Peds  Hematology  (+) Blood dyscrasia, anemia , NHL s/p XRT/Chemo   Anesthesia Other Findings   Reproductive/Obstetrics                           Anesthesia Physical Anesthesia Plan  ASA: 3 and emergent  Anesthesia Plan: General   Post-op Pain Management: Minimal or no pain anticipated   Induction: Intravenous, Rapid sequence and Cricoid pressure planned  PONV Risk Score and Plan: 3 and Treatment may vary due to age or medical condition and Dexamethasone  Airway Management Planned: Oral ETT  Additional Equipment: None  Intra-op Plan:   Post-operative Plan: Extubation in OR  Informed Consent: I have reviewed the patients History and Physical, chart, labs and discussed the procedure  including the risks, benefits and alternatives for the proposed anesthesia with the patient or authorized representative who has indicated his/her understanding and acceptance.     Dental advisory given  Plan Discussed with: CRNA and Anesthesiologist  Anesthesia Plan Comments:       Anesthesia Quick Evaluation

## 2021-12-30 NOTE — ED Notes (Signed)
Patient transported to CT 

## 2021-12-30 NOTE — ED Notes (Signed)
Pt is refusing an IV and states, " I just don't give a damn."

## 2021-12-30 NOTE — Anesthesia Postprocedure Evaluation (Signed)
Anesthesia Post Note  Patient: Albert Hahn  Procedure(s) Performed: CYSTOSCOPY WITH RETROGRADE PYELOGRAM/URETERAL STENT PLACEMENT (Left: Ureter)     Patient location during evaluation: PACU Anesthesia Type: General Level of consciousness: awake and alert and oriented Pain management: pain level controlled Vital Signs Assessment: post-procedure vital signs reviewed and stable Respiratory status: spontaneous breathing, nonlabored ventilation and respiratory function stable Cardiovascular status: blood pressure returned to baseline and stable Postop Assessment: no apparent nausea or vomiting Anesthetic complications: no   No notable events documented.  Last Vitals:  Vitals:   12/30/21 1807 12/30/21 2215  BP: (!) 162/81   Pulse: 71   Resp: 20 14  Temp:  37 C  SpO2: 100%     Last Pain:  Vitals:   12/30/21 2215  TempSrc:   PainSc: 0-No pain                 Kimmerly Lora A.

## 2021-12-30 NOTE — ED Provider Notes (Addendum)
[facility] Provider Note   CSN: [id number] Arrival date & time: 12/30/21  1539     History  Chief Complaint  Patient presents with   Acute Renal Failure    Albert Hahn is a 76 y.o. male.  HPI 76 year old male with a history of hypertension and PTSD presents with renal failure and left hydronephrosis.  History is from patient but also daughter and wife at the bedside.  They indicate that he has been having recurrent urinary tract infections and is currently on cephalexin for a UTI over the last 4 days.  Because of these recurrent infections, blood work and ultrasound were obtained.  His creatinine has worsened to 2.5 and his ultrasound showed severe left hydronephrosis.  At that time there was only 89 cc in his bladder as well.  Patient tells me he feels fine.  There have been no reported fevers, vomiting or diarrhea.  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  Home Medications Prior to Admission medications   Medication Sig Start Date End Date Taking? Authorizing Provider  acetaminophen (TYLENOL) 500 MG tablet Take 500-1,000 mg by mouth every 6 (six) hours as needed for mild pain or moderate pain.   Yes [provider]  allopurinol (ZYLOPRIM) 100 MG tablet Take 1 tablet by mouth daily. 08/11/21  Yes [provider]  amitriptyline (ELAVIL) 50 MG tablet Take 1.5 tablets (75 mg total) by mouth at bedtime. Patient taking differently: Take 50 mg by mouth at bedtime. 10/31/19  Yes Johnson, Clanford L, MD  amLODipine (NORVASC) 10 MG tablet Take 10 mg by mouth daily.   Yes [provider]  carbidopa-levodopa (SINEMET IR) 25-100 MG tablet Take 2 tablets by mouth 3 (three) times daily. 03/13/21  Yes [provider]  Cephalexin 500 MG tablet Take 500 mg by mouth 3 (three) times daily. 12/27/21  Yes [provider]  hydrochlorothiazide (HYDRODIURIL) 25 MG tablet Take 0.5 tablets (12.5 mg total) by mouth daily. Patient  taking differently: Take 25 mg by mouth daily. 10/31/19  Yes Johnson, Clanford L, MD  hydrocortisone cream 1 % Apply 1 application. topically as needed for itching.   Yes [provider]  Melatonin 3 MG CAPS Take 6 mg by mouth at bedtime as needed (sleep). 04/30/21  Yes [provider]  Polyethyl Glycol-Propyl Glycol 0.4-0.3 % SOLN Place 1-2 drops into both eyes daily.   Yes [provider]  potassium chloride SA (KLOR-CON M) 20 MEQ tablet Take 40 mEq by mouth daily. 01/29/21  Yes [provider]  QUEtiapine (SEROQUEL) 25 MG tablet Take 25 mg by mouth See admin instructions. Take 1 tablet at 3 pm and 1 tablet at bedtime 04/30/21  Yes [provider]  topiramate (TOPAMAX) 100 MG tablet Take 100 mg by mouth daily. At bedtime   Yes [provider]  allopurinol (ZYLOPRIM) 100 MG tablet Take 100 mg by mouth daily.    [provider]  potassium chloride 20 MEQ/15ML (10%) SOLN Take 30 mLs (40 mEq total) by mouth daily for 7 doses. Patient not taking: Reported on 12/30/2021 11/01/19 11/08/19  Murlean Iba, MD      Allergies    Bee venom, Shellfish allergy, and Codeine    Review of Systems   Review of Systems  Constitutional:  Negative for fever.  Gastrointestinal:  Negative for abdominal pain, diarrhea and vomiting.  Genitourinary:  Negative for flank pain.  Musculoskeletal:  Negative for back pain.   Physical Exam Updated  Vital Signs BP (!) 151/124   Pulse 65   Temp (!) 97.5 F (36.4 C) (Oral)   Resp 18   Ht '5\' 5"'$  (1.651 m)   Wt 85.3 kg   SpO2 100%   BMI 31.28 kg/m  Physical Exam Vitals and nursing note reviewed.  Constitutional:      Appearance: He is well-developed. He is not ill-appearing or diaphoretic.  HENT:     Head: Normocephalic and atraumatic.  Cardiovascular:     Rate and Rhythm: Normal rate and regular rhythm.     Heart sounds: Normal heart sounds.  Pulmonary:     Effort: Pulmonary effort is normal.      Breath sounds: Normal breath sounds.  Abdominal:     Palpations: Abdomen is soft.     Tenderness: There is no abdominal tenderness.  Skin:    General: Skin is warm and dry.  Neurological:     Mental Status: He is alert.    ED Results / Procedures / Treatments   Labs (all labs ordered are listed, but only abnormal results are displayed) Labs Reviewed  BASIC METABOLIC PANEL - Abnormal; Notable for the following components:      Result Value   CO2 21 (*)    BUN 47 (*)    Creatinine, Ser 2.49 (*)    GFR, Estimated 26 (*)    All other components within normal limits  CBC - Abnormal; Notable for the following components:   RBC 4.01 (*)    Hemoglobin 12.7 (*)    HCT 38.6 (*)    All other components within normal limits  URINALYSIS, ROUTINE W REFLEX MICROSCOPIC    EKG None  Radiology CT ABDOMEN PELVIS WO CONTRAST  Result Date: 12/30/2021 CLINICAL DATA:  Left flank and back pain. Severe left-sided hydronephrosis seen on recent ultrasound. EXAM: CT ABDOMEN AND PELVIS WITHOUT CONTRAST TECHNIQUE: Multidetector CT imaging of the abdomen and pelvis was performed following the standard protocol without IV contrast. RADIATION DOSE REDUCTION: This exam was performed according to the departmental dose-optimization program which includes automated exposure control, adjustment of the mA and/or kV according to patient size and/or use of iterative reconstruction technique. COMPARISON:  Renal ultrasound 12/28/2021 and prior CT scan 04/26/2017. FINDINGS: Lower chest: Moderate tortuosity of the thoracic aorta. Scattered atherosclerotic calcifications. Extensive three-vessel coronary artery calcifications. The lung bases clear of an acute process. No pleural effusions or pulmonary lesions. Hepatobiliary: No hepatic lesions are without contrast. Small calcified granuloma noted in the right hepatic lobe is stable. The gallbladder is unremarkable. No common bile duct dilatation. Pancreas: No mass, inflammation  or ductal dilatation. Spleen: Normal size.  No focal lesions. Adrenals/Urinary Tract: Adrenal glands are normal. As demonstrated on the recent ultrasound examination there is severe left-sided hydronephrosis with progressive renal cortical thinning/atrophy of the left kidney when compared to the prior CT scan. Findings are most consistent with a chronic UPJ obstruction. The mid distal ureter is normal caliber. No renal, ureteral or bladder calculi. Slightly prominent extra renal pelvis on the right side but no hydronephrosis. Stomach/Bowel: The stomach, duodenum, small bowel and colon are grossly normal without oral contrast. No inflammatory changes, mass lesions or obstructive findings. The appendix is normal. Vascular/Lymphatic: Moderate atherosclerotic calcifications involving the aorta and iliac arteries but no aneurysm. No mesenteric or retroperitoneal mass or adenopathy. Reproductive: Mild prostate gland enlargement. The seminal vesicles are unremarkable. Other: Ventriculoperitoneal from catheter noted in the abdomen. Musculoskeletal: No significant bony findings. Stable vertebral augmentation changes at L3. Total left  hip arthroplasty noted with chronic marked lucency around the femoral and acetabular components possibly reflecting particle disease. IMPRESSION: 1. Chronic severe left-sided hydronephrosis with progressive renal cortical thinning/atrophy. 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 2. No renal, ureteral or bladder calculi or mass. 3. No acute abdominal/pelvic findings, mass lesions or adenopathy. 4. Extensive three-vessel coronary artery calcifications. Aortic Atherosclerosis (ICD10-I70.0). Electronically Signed   By: Marijo Sanes M.D.   On: 12/30/2021 17:21    Procedures Ultrasound ED Peripheral IV (Provider)  Date/Time: 12/31/2021 3:00 PM Performed by: Sherwood Gambler, MD Authorized by: Sherwood Gambler, MD   Procedure details:    Indications: multiple failed IV attempts      Skin Prep: chlorhexidine gluconate     Location:  Right AC   Angiocath:  20 G   Bedside Ultrasound Guided: Yes     Patient tolerated procedure without complications: Yes     Dressing applied: Yes      Medications Ordered in ED Medications  lactated ringers bolus 1,000 mL (has no administration in time range)    ED Course/ Medical Decision Making/ A&P                           Medical Decision Making Amount and/or Complexity of Data Reviewed Independent Historian: spouse External Data Reviewed: labs, radiology and notes. Labs: ordered. Radiology: ordered and independent interpretation performed.  Risk Decision regarding hospitalization.   Patient is well appearing and hemodynamically stable. No pain. Workup started by PCP due to recurrent UTIs, is currently on keflex. Labs here show stable Cr compared to 2 days ago from PCP but worse than baseline. Normal WBC. Doesn't appear septic. No significant electrolyte disturbance. CT obtained to better clarify the hydronephrosis as well as potential cause. I have personally viewed/interpreted these images. No obvious stone but definitely has hydro. D/w Dr. Abner Greenspan of urology, who will take to OR tonight for stenting. D/w Dr. Denton Brick for admission for treatment of renal failure. Stable for transfer to [facility].         Final Clinical Impression(s) / ED Diagnoses Final diagnoses:  Acute kidney injury ([place])  Hydronephrosis, left    Rx / DC Orders ED Discharge Orders     None         Sherwood Gambler, MD 12/31/21 1245    Sherwood Gambler, MD 12/31/21 1500

## 2021-12-30 NOTE — H&P (Addendum)
Urology Consult   Physician requesting consult: Jenetta Downer, MD  Reason for consult: Left UPJ obstruction  History of Present Illness: Albert Hahn is a 76 y.o. with a past medical history severe for Lewy body dementia, Parkinson's disease, hypertension.  He was sent to the ED from his primary care office with reports of acute renal failure.  Creatinine was 2.49 according to a EGFR of 26 from a baseline of 1.6 in 02/2021.  CT A/P 12/30/2021 incidentally revealed chronic severe left-sided hydronephrosis with progressive renal cortical atrophy consistent with a chronic UPJ obstruction.  There were no ureteral stones or masses identified.  Of note, reviewing his prior imaging, there has been evidence of chronic hydronephrosis in the past.  He does report a history of urolithiasis and in 2018 had a punctate right lower pole stone.  He denies passing any stones over the past several years.  There is no stones or any current imaging.  He denies significant bothersome abdominal pain or flank pain.  He denies fevers, chills, dysuria.  At baseline, he does state that he is weak flow of stream with sensation incomplete bladder emptying and sometimes incontinence.  Unclear if this is overflow incontinence.  He previously saw Dr. Alyson Ingles in 2019 with BPH/LUTS and was on tamsulosin and finasteride.  He also had a negative gross hematuria evaluation in 2019.   He denies hematuria, UTIs, STDs, urolithiasis, GU malignancy/trauma/surgery.  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   PTSD (post-traumatic stress disorder)     Past Surgical History:  Procedure Laterality Date   BACK SURGERY     L3-L4 Fusion per pt   COLONOSCOPY W/  POLYPECTOMY     HERNIA REPAIR     HIP SURGERY     JOINT REPLACEMENT     LUMBAR PUNCTURE  07/23/2012   severe headaches   NECK SURGERY     metal plates and screws in neck    TOTAL HIP ARTHROPLASTY     VENTRICULOPERITONEAL SHUNT N/A 11/25/2016   Procedure: SHUNT INSERTION VENTRICULAR-PERITONEAL;  Surgeon: Eustace Moore, MD;  Location: [facility];  Service: Neurosurgery;  Laterality: N/A;    Medications:  Home meds:  No current facility-administered medications on file prior to encounter.   Current Outpatient Medications on File Prior to Encounter  Medication Sig Dispense Refill   acetaminophen (TYLENOL) 500 MG tablet Take 500-1,000 mg by mouth every 6 (six) hours as needed for mild pain or moderate pain.     allopurinol (ZYLOPRIM) 100 MG tablet Take 1 tablet by mouth daily.     amitriptyline (ELAVIL) 50 MG tablet Take 1.5 tablets (75 mg total) by mouth at bedtime. (Patient taking differently: Take 50 mg by mouth at bedtime.)     amLODipine (NORVASC) 10 MG tablet Take 10 mg by mouth daily.     carbidopa-levodopa (SINEMET IR) 25-100 MG tablet Take 2 tablets by mouth 3 (three) times daily.     Cephalexin 500 MG tablet Take 500 mg by mouth 3 (three) times daily.     hydrochlorothiazide (HYDRODIURIL) 25 MG tablet Take 0.5 tablets (12.5 mg total) by mouth daily. (Patient taking  differently: Take 25 mg by mouth daily.)     hydrocortisone cream 1 % Apply 1 application. topically as needed for itching.     Melatonin 3 MG CAPS Take 6 mg by mouth at bedtime as needed (sleep).     Polyethyl Glycol-Propyl Glycol 0.4-0.3 % SOLN Place 1-2 drops into both eyes daily.     potassium chloride SA (KLOR-CON M) 20 MEQ tablet Take 40 mEq by mouth daily.     QUEtiapine (SEROQUEL) 25 MG tablet Take 25 mg by mouth See admin instructions. Take 1 tablet at 3 pm and 1 tablet at bedtime     topiramate (TOPAMAX) 100 MG tablet Take 100 mg by mouth daily. At bedtime     allopurinol (ZYLOPRIM) 100 MG tablet Take 100 mg by  mouth daily.     potassium chloride 20 MEQ/15ML (10%) SOLN Take 30 mLs (40 mEq total) by mouth daily for 7 doses. (Patient not taking: Reported on 12/30/2021) 210 mL 0     Scheduled Meds: Continuous Infusions:  cefTRIAXone (ROCEPHIN)  IV     PRN Meds:.  Allergies:  Allergies  Allergen Reactions   Bee Venom Anaphylaxis   Shellfish Allergy Anaphylaxis   Codeine Nausea And Vomiting    Family History  Problem Relation Age of Onset   Healthy Mother    Healthy Father     Social History:  reports that he has quit smoking. He has never used smokeless tobacco. He reports that he does not drink alcohol and does not use drugs.  ROS: A complete review of systems was performed.  All systems are negative except for pertinent findings as noted.  Physical Exam:  Vital signs in last 24 hours: Temp:  [97.5 F (36.4 C)] 97.5 F (36.4 C) (05/18 1552) Pulse Rate:  [65-77] 71 (05/18 1807) Resp:  [17-20] 20 (05/18 1807) BP: (144-162)/(72-124) 162/81 (05/18 1807) SpO2:  [100 %] 100 % (05/18 1807) Weight:  [85.3 kg] 85.3 kg (05/18 1553) Constitutional:  Alert and oriented, No acute distress Cardiovascular: Regular rate and rhythm Respiratory: Normal respiratory effort, Lungs clear bilaterally GI: Abdomen is soft, nontender, nondistended, no abdominal masses Genitourinary: No CVAT. Normal male phallus, testes are descended bilaterally and non-tender and without masses, scrotum is normal in appearance without lesions or masses, perineum is normal on inspection. Neurologic: Grossly intact, no focal deficits Psychiatric: Normal mood and affect  Laboratory Data:  Recent Labs    12/30/21 1615  WBC 5.9  HGB 12.7*  HCT 38.6*  PLT 237    Recent Labs    12/30/21 1615  NA 140  K 4.0  CL 110  GLUCOSE 83  BUN 47*  CALCIUM 9.3  CREATININE 2.49*     Results for orders placed or performed during the hospital encounter of 12/30/21 (from the past 24 hour(s))  Basic metabolic panel      Status: Abnormal   Collection Time: 12/30/21  4:15 PM  Result Value Ref Range   Sodium 140 135 - 145 mmol/L   Potassium 4.0 3.5 - 5.1 mmol/L   Chloride 110 98 - 111 mmol/L   CO2 21 (L) 22 - 32 mmol/L   Glucose, Bld 83 70 - 99 mg/dL   BUN 47 (H) 8 - 23 mg/dL   Creatinine, Ser 2.49 (H) 0.61 - 1.24 mg/dL   Calcium 9.3 8.9 - 10.3 mg/dL   GFR, Estimated 26 (L) >60 mL/min   Anion gap 9 5 - 15  CBC     Status: Abnormal  Collection Time: 12/30/21  4:15 PM  Result Value Ref Range   WBC 5.9 4.0 - 10.5 K/uL   RBC 4.01 (L) 4.22 - 5.81 MIL/uL   Hemoglobin 12.7 (L) 13.0 - 17.0 g/dL   HCT 38.6 (L) 39.0 - 52.0 %   MCV 96.3 80.0 - 100.0 fL   MCH 31.7 26.0 - 34.0 pg   MCHC 32.9 30.0 - 36.0 g/dL   RDW 13.6 11.5 - 15.5 %   Platelets 237 150 - 400 K/uL   nRBC 0.0 0.0 - 0.2 %   No results found for this or any previous visit (from the past 240 hour(s)).  Renal Function: Recent Labs    12/30/21 1615  CREATININE 2.49*   Estimated Creatinine Clearance: 25.7 mL/min (A) (by C-G formula based on SCr of 2.49 mg/dL (H)).  Radiologic Imaging: CT ABDOMEN PELVIS WO CONTRAST  Result Date: 12/30/2021 CLINICAL DATA:  Left flank and back pain. Severe left-sided hydronephrosis seen on recent ultrasound. EXAM: CT ABDOMEN AND PELVIS WITHOUT CONTRAST TECHNIQUE: Multidetector CT imaging of the abdomen and pelvis was performed following the standard protocol without IV contrast. RADIATION DOSE REDUCTION: This exam was performed according to the departmental dose-optimization program which includes automated exposure control, adjustment of the mA and/or kV according to patient size and/or use of iterative reconstruction technique. COMPARISON:  Renal ultrasound 12/28/2021 and prior CT scan 04/26/2017. FINDINGS: Lower chest: Moderate tortuosity of the thoracic aorta. Scattered atherosclerotic calcifications. Extensive three-vessel coronary artery calcifications. The lung bases clear of an acute process. No pleural  effusions or pulmonary lesions. Hepatobiliary: No hepatic lesions are without contrast. Small calcified granuloma noted in the right hepatic lobe is stable. The gallbladder is unremarkable. No common bile duct dilatation. Pancreas: No mass, inflammation or ductal dilatation. Spleen: Normal size.  No focal lesions. Adrenals/Urinary Tract: Adrenal glands are normal. As demonstrated on the recent ultrasound examination there is severe left-sided hydronephrosis with progressive renal cortical thinning/atrophy of the left kidney when compared to the prior CT scan. Findings are most consistent with a chronic UPJ obstruction. The mid distal ureter is normal caliber. No renal, ureteral or bladder calculi. Slightly prominent extra renal pelvis on the right side but no hydronephrosis. Stomach/Bowel: The stomach, duodenum, small bowel and colon are grossly normal without oral contrast. No inflammatory changes, mass lesions or obstructive findings. The appendix is normal. Vascular/Lymphatic: Moderate atherosclerotic calcifications involving the aorta and iliac arteries but no aneurysm. No mesenteric or retroperitoneal mass or adenopathy. Reproductive: Mild prostate gland enlargement. The seminal vesicles are unremarkable. Other: Ventriculoperitoneal from catheter noted in the abdomen. Musculoskeletal: No significant bony findings. Stable vertebral augmentation changes at L3. 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 IMPRESSION: 1. Chronic severe left-sided hydronephrosis with progressive renal cortical thinning/atrophy. 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 2. No renal, ureteral or bladder calculi or mass. 3. No acute abdominal/pelvic findings, mass lesions or adenopathy. 4. Extensive three-vessel coronary artery calcifications. Aortic Atherosclerosis (ICD10-I70.0). Electronically Signed   By: Marijo Sanes M.D.    On: 12/30/2021 17:21    I independently reviewed the above imaging studies.  Impression/Recommendation Chronic severe left hydronephrosis consistent with chronic left UPJ obstruction Acute renal failure  -Given acute renal failure with EGFR 24, I recommend urgent left ureteral stent placement and trending creatinine. -I did review imaging with him demonstrating atrophic left kidney.  I did discuss that UPJ obstruction could be properly diagnosed with Lasix renal scan  and definitive treatment would consist of a pyeloplasty.  We also discussed possibility of chronic stent exchanges.  At this time, we will place a stent and manage his acute renal failure.  He will follow-up outpatient to discuss further options. -We will also manage his lower urinary tract symptoms outpatient  -The risks, benefits and alternatives of cystoscopy with left JJ stent placement was discussed with the patient.  Risks include, but are not limited to: bleeding, urinary tract infection, ureteral injury, ureteral stricture disease, chronic pain, urinary symptoms, bladder injury, stent migration, the need for nephrostomy tube placement, MI, CVA, DVT, PE and the inherent risks with general anesthesia.  The patient voices understanding and wishes to proceed.    Matt R.  MD 12/30/2021, 9:17 PM  [facility]  Pager: [phone]   CC: Jenetta Downer, MD

## 2021-12-30 NOTE — Assessment & Plan Note (Addendum)
CT today showing chronic severe left-sided hydronephrosis with progressive renal cortical thinning/atrophy consistent with chronic UPJ obstruction.  Diagnosed with UTI 4 days and started on cephalexin which he has been compliant with. -IV ceftriaxone 1 g daily for now considering presence of hydronephrosis -UA and urine culture pending, unsure if urine cultures were obtained by outpatient provider prior to antibiotics - EDP talked to urologist Dr. Abner Greenspan, recommended admission to [facility], plans take patient to [place]. -[facility] on arrival to [facility] -N.p.o.

## 2021-12-30 NOTE — Op Note (Signed)
Operative Note  Preoperative diagnosis:  1.  Severe left hydronephrosis 2. Atrophic left kidney 3. Acute renal failure  Postoperative diagnosis: 1.  Severe left hydronephrosis 2. Atrophic left kidney 3. Acute renal failure  Procedure(s): 1.  Cystoscopy 2. Left retrograde pyelogram with interpretation 3. Left ureteral stent placement 4. Fluoroscopy <1 hour with intraoperative interpretation  Surgeon: Rexene Alberts, MD  Assistants:  None  Anesthesia:  General  Complications:  None  EBL:  Minimal  Specimens: 1.  ID Type Source Tests Collected by Time Destination  A : LEFT RENAL PELVIS URINE FOR CULTURE Urine Urine, Cystoscope URINE CULTURE Janith Lima, MD 12/30/2021 2154    Drains/Catheters: 1.  Left 6Fr x 26cm ureteral stent  Intraoperative findings:   Cystoscopy demonstrated no suspicious lesions, masses, stones or other pathology. Left retrograde pyelogram demonstrated severe left hydronephrosis. Tortuous left ureter. Successful left ureteral stent placement with curl in the renal pelvis and bladder respectively.  Indication:  Albert Hahn is a 76 y.o. male with chronic severe left hydronephrosis and signs of acute renal failure. After reviewing the management options for treatment, he elected to proceed with the above surgical procedure(s). We have discussed the potential benefits and risks of the procedure, side effects of the proposed treatment, the likelihood of the patient achieving the goals of the procedure, and any potential problems that might occur during the procedure or recuperation. Informed consent has been obtained.  Description of procedure: The patient was taken to the operating room and general anesthesia was induced.  The patient was placed in the dorsal lithotomy position, prepped and draped in the usual sterile fashion, and preoperative antibiotics were administered. A preoperative time-out was performed.   Cystourethroscopy was performed.  The  patient's urethra was examined and was normal. There was some bilobar prostatic hypertrophy. The bladder was then systematically examined in its entirety. There was no evidence for any bladder tumors, stones, or other mucosal pathology. He had severe trabeculation.  Attention then turned to the left ureteral orifice. A 0.038 zip wire was passed through the left orifice and over the wire a 5 Fr open ended catheter was inserted and passed up to the level of the renal pelvis. Aspirate was obtained and sent off as left renal pelvis urine for culture. Of note, he had a severely tortuous left ureter that deviated laterally.  Omnipaque contrast was injected through the ureteral catheter and a retrograde pyelogram was performed with findings as dictated above. The wire was then replaced and the open ended catheter was removed.   A 6Fr x 26cm ureteral stent was advance over the wire. The stent was positioned appropriately under fluoroscopic and cystoscopic guidance.  The wire was then removed with an adequate stent curl noted in the renal pelvis as well as in the bladder.  The bladder was then emptied and the procedure ended.  The patient appeared to tolerate the procedure well and without complications.  The patient was able to be awakened and transferred to the recovery unit in satisfactory condition.   Plan:  Trend creatinine. Will arrange for outpatient followup.  [facility] R[facility]  Pager: [phone]

## 2021-12-31 ENCOUNTER — Encounter (HOSPITAL_COMMUNITY): Payer: Self-pay | Admitting: Urology

## 2021-12-31 DIAGNOSIS — N179 Acute kidney failure, unspecified: Secondary | ICD-10-CM | POA: Diagnosis not present

## 2021-12-31 DIAGNOSIS — N1330 Unspecified hydronephrosis: Secondary | ICD-10-CM | POA: Diagnosis not present

## 2021-12-31 DIAGNOSIS — G20 Parkinson's disease: Secondary | ICD-10-CM | POA: Diagnosis not present

## 2021-12-31 DIAGNOSIS — G3183 Dementia with Lewy bodies: Secondary | ICD-10-CM | POA: Diagnosis not present

## 2021-12-31 LAB — CBC
HCT: 37.7 % — ABNORMAL LOW (ref 39.0–52.0)
Hemoglobin: 12.8 g/dL — ABNORMAL LOW (ref 13.0–17.0)
MCH: 32.7 pg (ref 26.0–34.0)
MCHC: 34 g/dL (ref 30.0–36.0)
MCV: 96.2 fL (ref 80.0–100.0)
Platelets: 199 10*3/uL (ref 150–400)
RBC: 3.92 MIL/uL — ABNORMAL LOW (ref 4.22–5.81)
RDW: 13.5 % (ref 11.5–15.5)
WBC: 5.5 10*3/uL (ref 4.0–10.5)
nRBC: 0 % (ref 0.0–0.2)

## 2021-12-31 LAB — BASIC METABOLIC PANEL
Anion gap: 7 (ref 5–15)
BUN: 43 mg/dL — ABNORMAL HIGH (ref 8–23)
CO2: 21 mmol/L — ABNORMAL LOW (ref 22–32)
Calcium: 9.4 mg/dL (ref 8.9–10.3)
Chloride: 110 mmol/L (ref 98–111)
Creatinine, Ser: 2.43 mg/dL — ABNORMAL HIGH (ref 0.61–1.24)
GFR, Estimated: 27 mL/min — ABNORMAL LOW (ref >60)
Glucose, Bld: 136 mg/dL — ABNORMAL HIGH (ref 70–99)
Potassium: 4.5 mmol/L (ref 3.5–5.1)
Sodium: 138 mmol/L (ref 135–145)

## 2021-12-31 MED ORDER — ACETAMINOPHEN 500 MG PO TABS
500.0000 mg | ORAL_TABLET | Freq: Four times a day (QID) | ORAL | Status: DC | PRN
  Administered 2021-12-31: 1000 mg via ORAL
  Filled 2021-12-31: qty 2

## 2021-12-31 MED ORDER — MELATONIN 3 MG PO TABS: 6.0000 mg | ORAL_TABLET | Freq: Every evening | ORAL | Status: DC | PRN

## 2021-12-31 NOTE — Progress Notes (Signed)
Pt arrived to the floor A&O x4. Wife 2x daughters at bedside. Not c/o of any pain.

## 2021-12-31 NOTE — Transfer of Care (Signed)
Immediate Anesthesia Transfer of Care Note  Patient: Albert Hahn  Procedure(s) Performed: Procedure(s): CYSTOSCOPY WITH RETROGRADE PYELOGRAM/URETERAL STENT PLACEMENT (Left)  Patient Location: PACU  Anesthesia Type:General  Level of Consciousness: Alert, Awake, Oriented  Airway & Oxygen Therapy: Patient Spontanous Breathing  Post-op Assessment: Report given to RN  Post vital signs: Reviewed and stable  Last Vitals:  Vitals:   12/31/21 0713 12/31/21 1058  BP: (!) 144/94 138/86  Pulse: 76 90  Resp: 18 18  Temp: (!) 36.4 C 36.7 C  SpO2: 38% 17%    Complications: No apparent anesthesia complications

## 2021-12-31 NOTE — Progress Notes (Signed)
[facility] PROGRESS NOTE    Progress Note  Albert Hahn  RJJ:[id number] DOB: 1945-10-12 DOA: 12/30/2021 PCP: Asencion Noble, MD     Brief Narrative:   Albert Hahn is an 76 y.o. male past medical history significant for Lewy body dementia, Parkinson's disease, recently treated with Keflex 4 days prior to admission for UTI comes into the ED for acute kidney injury referred by his PCP abdominal ultrasound showed left-sided hydronephrosis (baseline creatinine 1.2-1.4) on admission 2.4 CT shows severe left chronic hydronephrosis consistent with chronic UPJ obstruction urology was consulted who performed cystoscopy on 12/30/2021 with left ureteral stent placement   Assessment/Plan:   Hydronephrosis of left kidney: Status post cystoscopy on 12/30/2021 with left ureteral stent placement. He was started empirically on IV Rocephin. Urine cultures were sent. Was also started on IV fluids patient metabolic panels pending this morning.  Acute kidney injury due to up postobstructive uropathy: With a baseline creatinine of 1.3-1.4 on admission 2.6. He has been fluid resuscitated status post cystoscopy. Basic metabolic panel is pending this morning.  History of Non Hodgkin's lymphoma ([place]) Noted  Migraine headaches: Resume Tylenol and Topamax.  Parkinson's disease (HCC)/ Lewy body dementia ([place]) He ambulates with a walker and sometimes in a wheelchair. Continue Sinemet, Seroquel, Elavil and Topamax.  Stage I sacral decubitus ulcer present on admission: RN Pressure Injury Documentation: Pressure Injury 10/07/19 Buttocks Right Stage 1 -  Intact skin with non-blanchable redness of a localized area usually over a bony prominence. (Active)  10/07/19 1834  Location: Buttocks  Location Orientation: Right  Staging: Stage 1 -  Intact skin with non-blanchable redness of a localized area usually over a bony prominence.  Wound Description (Comments):   Present on Admission: Yes      Pressure Injury 10/07/19 Buttocks Right Stage 1 -  Intact skin with non-blanchable redness of a localized area usually over a bony prominence. (Active)  10/07/19 1835  Location: Buttocks  Location Orientation: Right  Staging: Stage 1 -  Intact skin with non-blanchable redness of a localized area usually over a bony prominence.  Wound Description (Comments):   Present on Admission: Yes      DVT prophylaxis: lovenox Family Communication:wife Status is: Inpatient Remains inpatient appropriate because: Acute kidney injury    Code Status:     Code Status Orders  (From admission, onward)           Start     Ordered   12/30/21 2323  Full code  Continuous        12/30/21 2322           Code Status History     Date Active Date Inactive Code Status Order ID Comments User Context   10/30/2019 1021 10/31/2019 1850 DNR [id number]  Drue Novel, NP Inpatient   10/28/2019 1640 10/30/2019 1020 Full Code [id number]  Neena Rhymes, MD ED   10/07/2019 1505 10/13/2019 1932 Full Code [id number]  Roxan Hockey, MD Inpatient   11/25/2016 2004 11/28/2016 1611 Full Code [id number]  Eustace Moore, MD Inpatient         IV Access:   Peripheral IV   Procedures and diagnostic studies:   CT ABDOMEN PELVIS WO CONTRAST  Result Date: 12/30/2021 CLINICAL DATA:  Left flank and back pain. Severe left-sided hydronephrosis seen on recent ultrasound. EXAM: CT ABDOMEN AND PELVIS WITHOUT CONTRAST TECHNIQUE: Multidetector CT imaging of the abdomen and pelvis was performed following the standard protocol without IV contrast. RADIATION DOSE REDUCTION: This exam was  performed according to the departmental dose-optimization program which includes automated exposure control, adjustment of the mA and/or kV according to patient size and/or use of iterative reconstruction technique. COMPARISON:  Renal ultrasound 12/28/2021 and prior CT scan 04/26/2017. FINDINGS: Lower chest: Moderate tortuosity of the  thoracic aorta. Scattered atherosclerotic calcifications. Extensive three-vessel coronary artery calcifications. The lung bases clear of an acute process. No pleural effusions or pulmonary lesions. Hepatobiliary: No hepatic lesions are without contrast. Small calcified granuloma noted in the right hepatic lobe is stable. The gallbladder is unremarkable. No common bile duct dilatation. Pancreas: No mass, inflammation or ductal dilatation. Spleen: Normal size.  No focal lesions. Adrenals/Urinary Tract: Adrenal glands are normal. As demonstrated on the recent ultrasound examination there is severe left-sided hydronephrosis with progressive renal cortical thinning/atrophy of the left kidney when compared to the prior CT scan. Findings are most consistent with a chronic UPJ obstruction. The mid distal ureter is normal caliber. No renal, ureteral or bladder calculi. Slightly prominent extra renal pelvis on the right side but no hydronephrosis. Stomach/Bowel: The stomach, duodenum, small bowel and colon are grossly normal without oral contrast. No inflammatory changes, mass lesions or obstructive findings. The appendix is normal. Vascular/Lymphatic: Moderate atherosclerotic calcifications involving the aorta and iliac arteries but no aneurysm. No mesenteric or retroperitoneal mass or adenopathy. Reproductive: Mild prostate gland enlargement. The seminal vesicles are unremarkable. Other: Ventriculoperitoneal from catheter noted in the abdomen. Musculoskeletal: No significant bony findings. Stable vertebral augmentation changes at L3. 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 IMPRESSION: 1. Chronic severe left-sided hydronephrosis with progressive renal cortical thinning/atrophy. 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 2. No renal, ureteral or bladder calculi or mass. 3. No acute abdominal/pelvic findings, mass  lesions or adenopathy. 4. Extensive three-vessel coronary artery calcifications. Aortic Atherosclerosis (ICD10-I70.0). Electronically Signed   By: Marijo Sanes M.D.   On: 12/30/2021 17:21   DG C-Arm 1-60 Min-No Report  Result Date: 12/30/2021 Fluoroscopy was utilized by the requesting physician.  No radiographic interpretation.     Medical Consultants:   None.   Subjective:    Albert Hahn complaining of headache.  Objective:    Vitals:   12/30/21 2245 12/30/21 2300 12/31/21 0311 12/31/21 0713  BP: (!) 152/89 (!) 165/88 136/71 (!) 144/94  Pulse: 67 71 86 76  Resp: '14 11 15 18  '$ Temp:  98.5 F (36.9 C) 98 F (36.7 C) (!) 97.5 F (36.4 C)  TempSrc:  Axillary Oral Oral  SpO2: 100% 100% 98% 99%  Weight:  86.8 kg    Height:       SpO2: 99 %   Intake/Output Summary (Last 24 hours) at 12/31/2021 0750 Last data filed at 12/31/2021 0300 Gross per 24 hour  Intake 1897.32 ml  Output --  Net 1897.32 ml   Filed Weights   12/30/21 1553 12/30/21 2300  Weight: 85.3 kg 86.8 kg    Exam: General exam: In no acute distress. Respiratory system: Good air movement and clear to auscultation. Cardiovascular system: S1 & S2 heard, RRR. No JVD, murmurs, rubs, gallops or clicks.  Gastrointestinal system: Abdomen is nondistended, soft and nontender.  Central nervous system: Alert and oriented. No focal neurological deficits. Extremities: No pedal edema. Skin: No rashes, lesions or ulcers Psychiatry: Judgement and insight appear normal. Mood & affect appropriate.    Data Reviewed:    Labs: Basic Metabolic Panel: Recent Labs  Lab 12/30/21 1615  NA 140  K 4.0  CL 110  CO2 21*  GLUCOSE 83  BUN 47*  CREATININE 2.49*  CALCIUM 9.3   GFR Estimated Creatinine Clearance: 26 mL/min (A) (by C-G formula based on SCr of 2.49 mg/dL (H)). Liver Function Tests: No results for input(s): AST, ALT, ALKPHOS, BILITOT, PROT, ALBUMIN in the last 168 hours. No results for input(s):  LIPASE, AMYLASE in the last 168 hours. No results for input(s): AMMONIA in the last 168 hours. Coagulation profile No results for input(s): INR, PROTIME in the last 168 hours. COVID-19 Labs  No results for input(s): DDIMER, FERRITIN, LDH, CRP in the last 72 hours.  No results found for: SARSCOV2NAA  CBC: Recent Labs  Lab 12/30/21 1615 12/31/21 0730  WBC 5.9 5.5  HGB 12.7* 12.8*  HCT 38.6* 37.7*  MCV 96.3 96.2  PLT 237 199   Cardiac Enzymes: No results for input(s): CKTOTAL, CKMB, CKMBINDEX, TROPONINI in the last 168 hours. BNP (last 3 results) No results for input(s): PROBNP in the last 8760 hours. CBG: No results for input(s): GLUCAP in the last 168 hours. D-Dimer: No results for input(s): DDIMER in the last 72 hours. Hgb A1c: No results for input(s): HGBA1C in the last 72 hours. Lipid Profile: No results for input(s): CHOL, HDL, LDLCALC, TRIG, CHOLHDL, LDLDIRECT in the last 72 hours. Thyroid function studies: No results for input(s): TSH, T4TOTAL, T3FREE, THYROIDAB in the last 72 hours.  Invalid input(s): FREET3 Anemia work up: No results for input(s): VITAMINB12, FOLATE, FERRITIN, TIBC, IRON, RETICCTPCT in the last 72 hours. Sepsis Labs: Recent Labs  Lab 12/30/21 1615 12/31/21 0730  WBC 5.9 5.5   Microbiology No results found for this or any previous visit (from the past 240 hour(s)).   Medications:    allopurinol  100 mg Oral Daily   amitriptyline  50 mg Oral QHS   carbidopa-levodopa  2 tablet Oral TID   QUEtiapine  25 mg Oral 2 times per day   topiramate  100 mg Oral QHS   Continuous Infusions:  cefTRIAXone (ROCEPHIN)  IV     lactated ringers 100 mL/hr at 12/31/21 0545      LOS: 1 day   Charlynne Cousins  [facility]  12/31/2021, 7:50 AM

## 2021-12-31 NOTE — Progress Notes (Signed)
1 Day Post-Op Subjective: Denies pain, no nausea or emesis.  Objective: Vital signs in last 24 hours: Temp:  [97.5 F (36.4 C)-98.6 F (37 C)] 98.1 F (36.7 C) (05/19 1058) Pulse Rate:  [65-90] 90 (05/19 1058) Resp:  [11-20] 18 (05/19 1058) BP: (136-165)/(71-124) 138/86 (05/19 1058) SpO2:  [98 %-100 %] 98 % (05/19 1058) Weight:  [85.3 kg-86.8 kg] 86.8 kg (05/18 2300)  Intake/Output from previous day: 05/18 0701 - 05/19 0700 In: 1897.3 [I.V.:797.3; IV Piggyback:100] Out: -  Intake/Output this shift: Total I/O In: 354 [P.O.:354] Out: -   Physical Exam:  General: Alert and oriented CV: RRR Lungs: Clear Abdomen: Soft, ND, NT Ext: NT, No erythema  Lab Results: Recent Labs    12/30/21 1615 12/31/21 0730  HGB 12.7* 12.8*  HCT 38.6* 37.7*   BMET Recent Labs    12/30/21 1615 12/31/21 0730  NA 140 138  K 4.0 4.5  CL 110 110  CO2 21* 21*  GLUCOSE 83 136*  BUN 47* 43*  CREATININE 2.49* 2.43*  CALCIUM 9.3 9.4     Studies/Results: CT ABDOMEN PELVIS WO CONTRAST  Result Date: 12/30/2021 CLINICAL DATA:  Left flank and back pain. Severe left-sided hydronephrosis seen on recent ultrasound. EXAM: CT ABDOMEN AND PELVIS WITHOUT CONTRAST TECHNIQUE: Multidetector CT imaging of the abdomen and pelvis was performed following the standard protocol without IV contrast. RADIATION DOSE REDUCTION: This exam was performed according to the departmental dose-optimization program which includes automated exposure control, adjustment of the mA and/or kV according to patient size and/or use of iterative reconstruction technique. COMPARISON:  Renal ultrasound 12/28/2021 and prior CT scan 04/26/2017. FINDINGS: Lower chest: Moderate tortuosity of the thoracic aorta. Scattered atherosclerotic calcifications. Extensive three-vessel coronary artery calcifications. The lung bases clear of an acute process. No pleural effusions or pulmonary lesions. Hepatobiliary: No hepatic lesions are without  contrast. Small calcified granuloma noted in the right hepatic lobe is stable. The gallbladder is unremarkable. No common bile duct dilatation. Pancreas: No mass, inflammation or ductal dilatation. Spleen: Normal size.  No focal lesions. Adrenals/Urinary Tract: Adrenal glands are normal. As demonstrated on the recent ultrasound examination there is severe left-sided hydronephrosis with progressive renal cortical thinning/atrophy of the left kidney when compared to the prior CT scan. Findings are most consistent with a chronic UPJ obstruction. The mid distal ureter is normal caliber. No renal, ureteral or bladder calculi. Slightly prominent extra renal pelvis on the right side but no hydronephrosis. Stomach/Bowel: The stomach, duodenum, small bowel and colon are grossly normal without oral contrast. No inflammatory changes, mass lesions or obstructive findings. The appendix is normal. Vascular/Lymphatic: Moderate atherosclerotic calcifications involving the aorta and iliac arteries but no aneurysm. No mesenteric or retroperitoneal mass or adenopathy. Reproductive: Mild prostate gland enlargement. The seminal vesicles are unremarkable. Other: Ventriculoperitoneal from catheter noted in the abdomen. Musculoskeletal: No significant bony findings. Stable vertebral augmentation changes at L3. 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 IMPRESSION: 1. Chronic severe left-sided hydronephrosis with progressive renal cortical thinning/atrophy. 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 2. No renal, ureteral or bladder calculi or mass. 3. No acute abdominal/pelvic findings, mass lesions or adenopathy. 4. Extensive three-vessel coronary artery calcifications. Aortic Atherosclerosis (ICD10-I70.0). Electronically Signed   By: Marijo Sanes M.D.   On: 12/30/2021 17:21   DG C-Arm 1-60 Min-No Report  Result Date:  12/30/2021 Fluoroscopy was utilized by the requesting physician.  No radiographic interpretation.  Assessment/Plan: Chronic severe left hydronephrosis consistent with chronic left UPJ obstruction s/p L ureteral stent 12/30/2021 Acute renal failure  -Minimal improvement in creatinine s/p L stent -Ok to d/c home from GU standpoint this weekend -Arranged followup with me outpatient to discuss his chronic left hydronephrosis   LOS: 1 day   Matt R. Tari Lecount MD 12/31/2021, 2:32 PM [facility]  Pager: [phone]

## 2022-01-01 DIAGNOSIS — G20 Parkinson's disease: Secondary | ICD-10-CM | POA: Diagnosis not present

## 2022-01-01 DIAGNOSIS — N179 Acute kidney failure, unspecified: Secondary | ICD-10-CM | POA: Diagnosis not present

## 2022-01-01 DIAGNOSIS — N1330 Unspecified hydronephrosis: Secondary | ICD-10-CM | POA: Diagnosis not present

## 2022-01-01 LAB — BASIC METABOLIC PANEL
Anion gap: 7 (ref 5–15)
BUN: 46 mg/dL — ABNORMAL HIGH (ref 8–23)
CO2: 22 mmol/L (ref 22–32)
Calcium: 8.9 mg/dL (ref 8.9–10.3)
Chloride: 112 mmol/L — ABNORMAL HIGH (ref 98–111)
Creatinine, Ser: 2.23 mg/dL — ABNORMAL HIGH (ref 0.61–1.24)
GFR, Estimated: 30 mL/min — ABNORMAL LOW (ref >60)
Glucose, Bld: 94 mg/dL (ref 70–99)
Potassium: 3.6 mmol/L (ref 3.5–5.1)
Sodium: 141 mmol/L (ref 135–145)

## 2022-01-01 LAB — URINE CULTURE: Culture: NO GROWTH

## 2022-01-01 MED ORDER — CEFDINIR 300 MG PO CAPS: 300.0000 mg | ORAL_CAPSULE | Freq: Two times a day (BID) | ORAL | 0 refills | Status: AC

## 2022-01-01 NOTE — TOC CM/SW Note (Signed)
  Transition of Care [facility]) Screening Note   Patient Details  Name: Albert Hahn Date of Birth: Sep 24, 1945   Transition of Care [facility]) CM/SW Contact:    Ross Ludwig, LCSW Phone Number: 01/01/2022, 3:55 PM    Transition of Care Department [facility]) has reviewed patient and no TOC needs have been identified at this time. We will continue to monitor patient advancement through interdisciplinary progression rounds. If new patient transition needs arise, please place a TOC consult.

## 2022-01-01 NOTE — Progress Notes (Signed)
Demarcus Thielke Luu to be D/C'd Home per MD order.  Discussed with the patient and all questions fully answered.  VSS, Skin clean, dry and intact without evidence of skin break down, no evidence of skin tears noted. IV catheter discontinued intact. Site without signs and symptoms of complications. Dressing and pressure applied.  An After Visit Summary was printed and given to the patient. Patient received prescription.  D/c education completed with patient/family including follow up instructions, medication list, d/c activities limitations if indicated, with other d/c instructions as indicated by MD - patient/family able to verbalize understanding, all questions fully answered.   Patient instructed to return to ED, call 911, or call MD for any changes in condition.   Patient escorted via [place], and D/C home via private auto.  Manuella Ghazi 01/01/2022 3:13 PM

## 2022-01-01 NOTE — Discharge Summary (Signed)
Physician Discharge Summary  Albert Hahn MWU:[id number] DOB: 03-31-1946 DOA: 12/30/2021  PCP: Asencion Noble, MD  Admit date: 12/30/2021 Discharge date: 01/01/2022  Admitted From: Home  Disposition:  Home   Recommendations for Outpatient Follow-up:  Follow up with Urologyin 1-2 weeks Please obtain BMP/CBC in one week   Home Health:No  Equipment/Devices:No   Discharge Condition:None  CODE STATUS:Full Diet recommendation: Heart Healthy  Brief/Interim Summary: 76 y.o. male past medical history significant for Lewy body dementia, Parkinson's disease, recently treated with Keflex 4 days prior to admission for UTI comes into the ED for acute kidney injury referred by his PCP abdominal ultrasound showed left-sided hydronephrosis (baseline creatinine 1.2-1.4) on admission 2.4 CT shows severe left chronic hydronephrosis consistent with chronic UPJ obstruction urology was consulted who performed cystoscopy on 12/30/2021 with left ureteral stent placement      Discharge Diagnoses:  Principal Problem:   Hydronephrosis of left kidney Active Problems:   AKI (acute kidney injury) ([place])   Non Hodgkin's lymphoma ([place])   Parkinson's disease ([place])   Lewy body dementia ([place])   Hydronephrosis  Left kidney hydronephrosis: Leading to acute kidney injury CT scan showed severe left chronic hydronephrosis consistent with chronic UPJ obstruction urology was consulted perform cytopathy with stent placement on 12/30/2021. On admission he was started empirically on IV Rocephin urine cultures were sent and have been negative till date he was transitioned to oral Omnicef he defervesced he will continue oral Omnicef as an outpatient for an additional 5 days after discharge and will follow up with urology in 1 to 2 weeks.  Acute kidney injury due to postobstructive uropathy: With a baseline creatinine 1.3-1.4 on admission 2.6 he status post cystoscopy was fluid resuscitated and his creatinine on the day of  discharge 2.2 he will follow-up with urology as an outpatient we will check a basic metabolic panel then.  History of non-Hodgkin's lymphoma: Noted.  Migraine headaches: He was continuing Tylenol and Topamax no changes made.  Parkinson's disease/Lewy body dementia: He will continue Sinemet Seroquel Elavil and Topamax. No changes made to his medication.  Discharge Instructions  Discharge Instructions     Diet - low sodium heart healthy   Complete by: As directed    Increase activity slowly   Complete by: As directed       Allergies as of 01/01/2022       Reactions   Bee Venom Anaphylaxis   Shellfish Allergy Anaphylaxis   Codeine Nausea And Vomiting        Medication List     STOP taking these medications    Cephalexin 500 MG tablet       TAKE these medications    acetaminophen 500 MG tablet Commonly known as: TYLENOL Take 500-1,000 mg by mouth every 6 (six) hours as needed for mild pain or moderate pain.   allopurinol 100 MG tablet Commonly known as: ZYLOPRIM Take 100 mg by mouth daily.   allopurinol 100 MG tablet Commonly known as: ZYLOPRIM Take 1 tablet by mouth daily.   amitriptyline 50 MG tablet Commonly known as: ELAVIL Take 1.5 tablets (75 mg total) by mouth at bedtime. What changed: how much to take   amLODipine 10 MG tablet Commonly known as: NORVASC Take 10 mg by mouth daily.   carbidopa-levodopa 25-100 MG tablet Commonly known as: SINEMET IR Take 2 tablets by mouth 3 (three) times daily.   cefdinir 300 MG capsule Commonly known as: OMNICEF Take 1 capsule (300 mg total) by mouth 2 (two) times  daily for 5 days.   hydrochlorothiazide 25 MG tablet Commonly known as: HYDRODIURIL Take 0.5 tablets (12.5 mg total) by mouth daily. What changed: how much to take   hydrocortisone cream 1 % Apply 1 application. topically as needed for itching.   Melatonin 3 MG Caps Take 6 mg by mouth at bedtime as needed (sleep).   Polyethyl  Glycol-Propyl Glycol 0.4-0.3 % Soln Place 1-2 drops into both eyes daily.   potassium chloride 20 MEQ/15ML (10%) Soln Take 30 mLs (40 mEq total) by mouth daily for 7 doses.   potassium chloride SA 20 MEQ tablet Commonly known as: KLOR-CON M Take 40 mEq by mouth daily.   QUEtiapine 25 MG tablet Commonly known as: SEROQUEL Take 25 mg by mouth See admin instructions. Take 1 tablet at 3 pm and 1 tablet at bedtime   topiramate 100 MG tablet Commonly known as: TOPAMAX Take 100 mg by mouth daily. At bedtime        Follow-up Information     [facility]. Schedule an appointment as soon as possible for a visit.   Contact information: [place] [address] [phone]               Allergies  Allergen Reactions   Bee Venom Anaphylaxis   Shellfish Allergy Anaphylaxis   Codeine Nausea And Vomiting    Consultations: Urology   Procedures/Studies: CT ABDOMEN PELVIS WO CONTRAST  Result Date: 12/30/2021 CLINICAL DATA:  Left flank and back pain. Severe left-sided hydronephrosis seen on recent ultrasound. EXAM: CT ABDOMEN AND PELVIS WITHOUT CONTRAST TECHNIQUE: Multidetector CT imaging of the abdomen and pelvis was performed following the standard protocol without IV contrast. RADIATION DOSE REDUCTION: This exam was performed according to the departmental dose-optimization program which includes automated exposure control, adjustment of the mA and/or kV according to patient size and/or use of iterative reconstruction technique. COMPARISON:  Renal ultrasound 12/28/2021 and prior CT scan 04/26/2017. FINDINGS: Lower chest: Moderate tortuosity of the thoracic aorta. Scattered atherosclerotic calcifications. Extensive three-vessel coronary artery calcifications. The lung bases clear of an acute process. No pleural effusions or pulmonary lesions. Hepatobiliary: No hepatic lesions are without contrast. Small calcified granuloma noted in the right  hepatic lobe is stable. The gallbladder is unremarkable. No common bile duct dilatation. Pancreas: No mass, inflammation or ductal dilatation. Spleen: Normal size.  No focal lesions. Adrenals/Urinary Tract: Adrenal glands are normal. As demonstrated on the recent ultrasound examination there is severe left-sided hydronephrosis with progressive renal cortical thinning/atrophy of the left kidney when compared to the prior CT scan. Findings are most consistent with a chronic UPJ obstruction. The mid distal ureter is normal caliber. No renal, ureteral or bladder calculi. Slightly prominent extra renal pelvis on the right side but no hydronephrosis. Stomach/Bowel: The stomach, duodenum, small bowel and colon are grossly normal without oral contrast. No inflammatory changes, mass lesions or obstructive findings. The appendix is normal. Vascular/Lymphatic: Moderate atherosclerotic calcifications involving the aorta and iliac arteries but no aneurysm. No mesenteric or retroperitoneal mass or adenopathy. Reproductive: Mild prostate gland enlargement. The seminal vesicles are unremarkable. Other: Ventriculoperitoneal from catheter noted in the abdomen. Musculoskeletal: No significant bony findings. Stable vertebral augmentation changes at L3. 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 IMPRESSION: 1. Chronic severe left-sided hydronephrosis with progressive renal cortical thinning/atrophy. 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 2. No renal, ureteral or bladder calculi or mass. 3.  No acute abdominal/pelvic findings, mass lesions or adenopathy. 4. Extensive three-vessel coronary artery calcifications. Aortic Atherosclerosis (ICD10-I70.0). Electronically Signed   By: Marijo Sanes M.D.   On: 12/30/2021 17:21   US RENAL  Result Date: 12/29/2021 CLINICAL DATA:  Recurrent UTI.  Incontinence. EXAM: RENAL / URINARY  TRACT ULTRASOUND COMPLETE COMPARISON:  Multiple CT scans of the chest, abdomen, and pelvis since April 26, 2017. FINDINGS: Right Kidney: Renal measurements: 10.1 x 5.4 x 6.3 cm = volume: 182 mL. Diffuse increased echogenicity. Contains a 1.6 cm simple cyst of no clinical significance. No follow-up imaging recommended. Left Kidney: Renal measurements: 15.5 x 9.9 x 9.3 cm = volume: 743 mL. Severe hydronephrosis is identified. No other obvious abnormalities on limited views of the left kidney. Bladder: The bladder is unremarkable other than an 89 cc postvoid residual. Other: None. IMPRESSION: 1. Severe left-sided hydronephrosis, new since comparison studies. The most recent comparison study is dated October 22, 2019. The finding is otherwise indeterminate. 2. Diffuse increased cortical echogenicity in the kidneys consistent with medical renal disease. 3. 89 cc postvoid residual in the bladder. Electronically Signed   By: Dorise Bullion III M.D.   On: 12/29/2021 16:19   DG C-Arm 1-60 Min-No Report  Result Date: 12/30/2021 Fluoroscopy was utilized by the requesting physician.  No radiographic interpretation.   (Echo, Carotid, EGD, Colonoscopy, ERCP)    Subjective: No complains  Discharge Exam: Vitals:   12/31/21 2016 01/01/22 0540  BP: (!) 156/89 (!) 147/78  Pulse: 95 70  Resp: 18 16  Temp: 99.5 F (37.5 C)   SpO2: 98% 96%   Vitals:   12/31/21 0713 12/31/21 1058 12/31/21 2016 01/01/22 0540  BP: (!) 144/94 138/86 (!) 156/89 (!) 147/78  Pulse: 76 90 95 70  Resp: '18 18 18 16  '$ Temp: (!) 97.5 F (36.4 C) 98.1 F (36.7 C) 99.5 F (37.5 C)   TempSrc: Oral Oral Oral   SpO2: 99% 98% 98% 96%  Weight:      Height:        General: Pt is alert, awake, not in acute distress Cardiovascular: RRR, S1/S2 +, no rubs, no gallops Respiratory: CTA bilaterally, no wheezing, no rhonchi Abdominal: Soft, NT, ND, bowel sounds + Extremities: no edema, no cyanosis    The results of significant  diagnostics from this hospitalization (including imaging, microbiology, ancillary and laboratory) are listed below for reference.     Microbiology: Recent Results (from the past 240 hour(s))  Urine Culture     Status: None   Collection Time: 12/30/21  9:54 PM   Specimen: Urine, Cystoscope  Result Value Ref Range Status   Specimen Description   Final    CYSTOSCOPY Performed at [facility] [address] [place], [place] [address]    Special Requests   Final    NONE Performed at [facility], [place] [address], [place], [place] [address]    Culture   Final    NO GROWTH Performed at [facility], [place] [address], [place], [place] [address]    Report Status 01/01/2022 FINAL  Final     Labs: BNP (last 3 results) No results for input(s): BNP in the last 8760 hours. Basic Metabolic Panel: Recent Labs  Lab 12/30/21 1615 12/31/21 0730 01/01/22 0956  NA 140 138 141  K 4.0 4.5 3.6  CL 110 110 112*  CO2 21* 21* 22  GLUCOSE 83 136* 94  BUN 47* 43* 46*  CREATININE 2.49* 2.43* 2.23*  CALCIUM 9.3 9.4 8.9   Liver Function Tests: No results for input(s): AST, ALT, ALKPHOS, BILITOT, PROT, ALBUMIN in the last 168 hours. No results for input(s): LIPASE, AMYLASE in the last 168 hours. No results for input(s): AMMONIA in the last 168 hours. CBC: Recent Labs  Lab 12/30/21 1615 12/31/21 0730  WBC 5.9 5.5  HGB 12.7* 12.8*  HCT 38.6* 37.7*  MCV 96.3 96.2  PLT 237 199   Cardiac Enzymes: No results for input(s): CKTOTAL, CKMB, CKMBINDEX, TROPONINI in the last 168 hours. BNP: Invalid input(s): POCBNP CBG: No results for input(s): GLUCAP in the last 168 hours. D-Dimer No results for input(s): DDIMER in the last 72 hours. Hgb A1c No results for input(s): HGBA1C in the last 72 hours. Lipid Profile No results for input(s): CHOL, HDL, LDLCALC, TRIG, CHOLHDL, LDLDIRECT in the last 72 hours. Thyroid function studies No results for input(s):  TSH, T4TOTAL, T3FREE, THYROIDAB in the last 72 hours.  Invalid input(s): FREET3 Anemia work up No results for input(s): VITAMINB12, FOLATE, FERRITIN, TIBC, IRON, RETICCTPCT in the last 72 hours. Urinalysis    Component Value Date/Time   COLORURINE AMBER (A) 10/28/2019 1242   APPEARANCEUR HAZY (A) 10/28/2019 1242   LABSPEC 1.013 10/28/2019 1242   LABSPEC 1.025 09/06/2006 1104   PHURINE 7.0 10/28/2019 1242   GLUCOSEU NEGATIVE 10/28/2019 1242   HGBUR MODERATE (A) 10/28/2019 1242   BILIRUBINUR NEGATIVE 10/28/2019 1242   BILIRUBINUR Negative 09/06/2006 1104   KETONESUR NEGATIVE 10/28/2019 1242   PROTEINUR 100 (A) 10/28/2019 1242   UROBILINOGEN 1.0 08/20/2008 1340   NITRITE POSITIVE (A) 10/28/2019 1242   LEUKOCYTESUR LARGE (A) 10/28/2019 1242   LEUKOCYTESUR Trace 09/06/2006 1104   Sepsis Labs Invalid input(s): PROCALCITONIN,  WBC,  LACTICIDVEN Microbiology Recent Results (from the past 240 hour(s))  Urine Culture     Status: None   Collection Time: 12/30/21  9:54 PM   Specimen: Urine, Cystoscope  Result Value Ref Range Status   Specimen Description   Final    CYSTOSCOPY Performed at [facility] [address], [place], [place] [address]    Special Requests   Final    NONE Performed at [facility], [place] [address], [place], [place] [address]    Culture   Final    NO GROWTH Performed at [facility], [place] [address], [place], [place] [address]    Report Status 01/01/2022 FINAL  Final     SIGNED:   Charlynne Cousins, MD  [facility] 01/01/2022, 2:27 PM Pager   If 7PM-7AM, please contact night-coverage [email or web address] Password [email or web address]

## 2022-01-01 NOTE — Progress Notes (Signed)
[facility] PROGRESS NOTE    Progress Note  Albert Hahn  JXB:[id number] DOB: 09/14/1945 DOA: 12/30/2021 PCP: Asencion Noble, MD     Brief Narrative:   Albert Hahn is an 76 y.o. male past medical history significant for Lewy body dementia, Parkinson's disease, recently treated with Keflex 4 days prior to admission for UTI comes into the ED for acute kidney injury referred by his PCP abdominal ultrasound showed left-sided hydronephrosis (baseline creatinine 1.2-1.4) on admission 2.4 CT shows severe left chronic hydronephrosis consistent with chronic UPJ obstruction urology was consulted who performed cystoscopy on 12/30/2021 with left ureteral stent placement   Assessment/Plan:   Hydronephrosis of left kidney: Status post cystoscopy on 12/30/2021 with left ureteral stent placement. He was started empirically on IV Rocephin. Urine cultures results are pending. Tmax of 99.5.  Acute kidney injury due to up postobstructive uropathy: With a baseline creatinine of 1.3-1.4 on admission 2.6. He has been fluid resuscitated status post cystoscopy. Basic metabolic panel is pending this morning. Hopefully his creatinine is better can be discharged today.  History of Non Hodgkin's lymphoma ([place]) Noted  Migraine headaches: Resume Tylenol and Topamax.  Parkinson's disease ([facility])/ Lewy body dementia ([place]) He ambulates with a walker and sometimes in a wheelchair. Continue Sinemet, Seroquel, Elavil and Topamax.  Stage I sacral decubitus ulcer present on admission: RN Pressure Injury Documentation: Pressure Injury 10/07/19 Buttocks Right Stage 1 -  Intact skin with non-blanchable redness of a localized area usually over a bony prominence. (Active)  10/07/19 1834  Location: Buttocks  Location Orientation: Right  Staging: Stage 1 -  Intact skin with non-blanchable redness of a localized area usually over a bony prominence.  Wound Description (Comments):   Present on Admission: Yes      Pressure Injury 10/07/19 Buttocks Right Stage 1 -  Intact skin with non-blanchable redness of a localized area usually over a bony prominence. (Active)  10/07/19 1835  Location: Buttocks  Location Orientation: Right  Staging: Stage 1 -  Intact skin with non-blanchable redness of a localized area usually over a bony prominence.  Wound Description (Comments):   Present on Admission: Yes      DVT prophylaxis: lovenox Family Communication:wife Status is: Inpatient Remains inpatient appropriate because: Acute kidney injury    Code Status:     Code Status Orders  (From admission, onward)           Start     Ordered   12/30/21 2323  Full code  Continuous        12/30/21 2322           Code Status History     Date Active Date Inactive Code Status Order ID Comments User Context   10/30/2019 1021 10/31/2019 1850 DNR [id number]  Drue Novel, NP Inpatient   10/28/2019 1640 10/30/2019 1020 Full Code [id number]  Neena Rhymes, MD ED   10/07/2019 1505 10/13/2019 1932 Full Code [id number]  Roxan Hockey, MD Inpatient   11/25/2016 2004 11/28/2016 1611 Full Code [id number]  Eustace Moore, MD Inpatient         IV Access:   Peripheral IV   Procedures and diagnostic studies:   CT ABDOMEN PELVIS WO CONTRAST  Result Date: 12/30/2021 CLINICAL DATA:  Left flank and back pain. Severe left-sided hydronephrosis seen on recent ultrasound. EXAM: CT ABDOMEN AND PELVIS WITHOUT CONTRAST TECHNIQUE: Multidetector CT imaging of the abdomen and pelvis was performed following the standard protocol without IV contrast. RADIATION DOSE REDUCTION: This exam  was performed according to the departmental dose-optimization program which includes automated exposure control, adjustment of the mA and/or kV according to patient size and/or use of iterative reconstruction technique. COMPARISON:  Renal ultrasound 12/28/2021 and prior CT scan 04/26/2017. FINDINGS: Lower chest: Moderate tortuosity of the  thoracic aorta. Scattered atherosclerotic calcifications. Extensive three-vessel coronary artery calcifications. The lung bases clear of an acute process. No pleural effusions or pulmonary lesions. Hepatobiliary: No hepatic lesions are without contrast. Small calcified granuloma noted in the right hepatic lobe is stable. The gallbladder is unremarkable. No common bile duct dilatation. Pancreas: No mass, inflammation or ductal dilatation. Spleen: Normal size.  No focal lesions. Adrenals/Urinary Tract: Adrenal glands are normal. As demonstrated on the recent ultrasound examination there is severe left-sided hydronephrosis with progressive renal cortical thinning/atrophy of the left kidney when compared to the prior CT scan. Findings are most consistent with a chronic UPJ obstruction. The mid distal ureter is normal caliber. No renal, ureteral or bladder calculi. Slightly prominent extra renal pelvis on the right side but no hydronephrosis. Stomach/Bowel: The stomach, duodenum, small bowel and colon are grossly normal without oral contrast. No inflammatory changes, mass lesions or obstructive findings. The appendix is normal. Vascular/Lymphatic: Moderate atherosclerotic calcifications involving the aorta and iliac arteries but no aneurysm. No mesenteric or retroperitoneal mass or adenopathy. Reproductive: Mild prostate gland enlargement. The seminal vesicles are unremarkable. Other: Ventriculoperitoneal from catheter noted in the abdomen. Musculoskeletal: No significant bony findings. Stable vertebral augmentation changes at L3. 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 IMPRESSION: 1. Chronic severe left-sided hydronephrosis with progressive renal cortical thinning/atrophy. 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 2. No renal, ureteral or bladder calculi or mass. 3. No acute abdominal/pelvic findings, mass  lesions or adenopathy. 4. Extensive three-vessel coronary artery calcifications. Aortic Atherosclerosis (ICD10-I70.0). Electronically Signed   By: Marijo Sanes M.D.   On: 12/30/2021 17:21   DG C-Arm 1-60 Min-No Report  Result Date: 12/30/2021 Fluoroscopy was utilized by the requesting physician.  No radiographic interpretation.     Medical Consultants:   None.   Subjective:    Albert Hahn no complaints  Objective:    Vitals:   12/31/21 0713 12/31/21 1058 12/31/21 2016 01/01/22 0540  BP: (!) 144/94 138/86 (!) 156/89 (!) 147/78  Pulse: 76 90 95 70  Resp: '18 18 18 16  '$ Temp: (!) 97.5 F (36.4 C) 98.1 F (36.7 C) 99.5 F (37.5 C)   TempSrc: Oral Oral Oral   SpO2: 99% 98% 98% 96%  Weight:      Height:       SpO2: 96 %   Intake/Output Summary (Last 24 hours) at 01/01/2022 0751 Last data filed at 01/01/2022 0500 Gross per 24 hour  Intake 594 ml  Output 2250 ml  Net -1656 ml    Filed Weights   12/30/21 1553 12/30/21 2300  Weight: 85.3 kg 86.8 kg    Exam: General exam: In no acute distress. Respiratory system: Good air movement and clear to auscultation. Cardiovascular system: S1 & S2 heard, RRR. No JVD. Gastrointestinal system: Abdomen is nondistended, soft and nontender.  Extremities: No pedal edema. Skin: No rashes, lesions or ulcers Psychiatry: Judgement and insight appear normal. Mood & affect appropriate.   Data Reviewed:    Labs: Basic Metabolic Panel: Recent Labs  Lab 12/30/21 1615 12/31/21 0730  NA 140 138  K 4.0 4.5  CL 110 110  CO2 21* 21*  GLUCOSE 83 136*  BUN 47* 43*  CREATININE 2.49* 2.43*  CALCIUM 9.3 9.4    GFR Estimated Creatinine Clearance: 26.6 mL/min (A) (by C-G formula based on SCr of 2.43 mg/dL (H)). Liver Function Tests: No results for input(s): AST, ALT, ALKPHOS, BILITOT, PROT, ALBUMIN in the last 168 hours. No results for input(s): LIPASE, AMYLASE in the last 168 hours. No results for input(s): AMMONIA in the last  168 hours. Coagulation profile No results for input(s): INR, PROTIME in the last 168 hours. COVID-19 Labs  No results for input(s): DDIMER, FERRITIN, LDH, CRP in the last 72 hours.  No results found for: SARSCOV2NAA  CBC: Recent Labs  Lab 12/30/21 1615 12/31/21 0730  WBC 5.9 5.5  HGB 12.7* 12.8*  HCT 38.6* 37.7*  MCV 96.3 96.2  PLT 237 199    Cardiac Enzymes: No results for input(s): CKTOTAL, CKMB, CKMBINDEX, TROPONINI in the last 168 hours. BNP (last 3 results) No results for input(s): PROBNP in the last 8760 hours. CBG: No results for input(s): GLUCAP in the last 168 hours. D-Dimer: No results for input(s): DDIMER in the last 72 hours. Hgb A1c: No results for input(s): HGBA1C in the last 72 hours. Lipid Profile: No results for input(s): CHOL, HDL, LDLCALC, TRIG, CHOLHDL, LDLDIRECT in the last 72 hours. Thyroid function studies: No results for input(s): TSH, T4TOTAL, T3FREE, THYROIDAB in the last 72 hours.  Invalid input(s): FREET3 Anemia work up: No results for input(s): VITAMINB12, FOLATE, FERRITIN, TIBC, IRON, RETICCTPCT in the last 72 hours. Sepsis Labs: Recent Labs  Lab 12/30/21 1615 12/31/21 0730  WBC 5.9 5.5    Microbiology No results found for this or any previous visit (from the past 240 hour(s)).   Medications:    allopurinol  100 mg Oral Daily   amitriptyline  50 mg Oral QHS   carbidopa-levodopa  2 tablet Oral TID   QUEtiapine  25 mg Oral 2 times per day   topiramate  100 mg Oral QHS   Continuous Infusions:  cefTRIAXone (ROCEPHIN)  IV 1 g (12/31/21 2049)      LOS: 2 days   Charlynne Cousins  [facility]  01/01/2022, 7:51 AM

## 2022-01-04 IMAGING — DX DG ABDOMEN ACUTE W/ 1V CHEST
4 series · 4 of 4 positions shown · non-contrast
Comparison: CT abdomen pelvis 04/26/2017

CLINICAL DATA: Fever and weakness

EXAM:
DG ABDOMEN ACUTE W/ 1V CHEST

[chest pa]
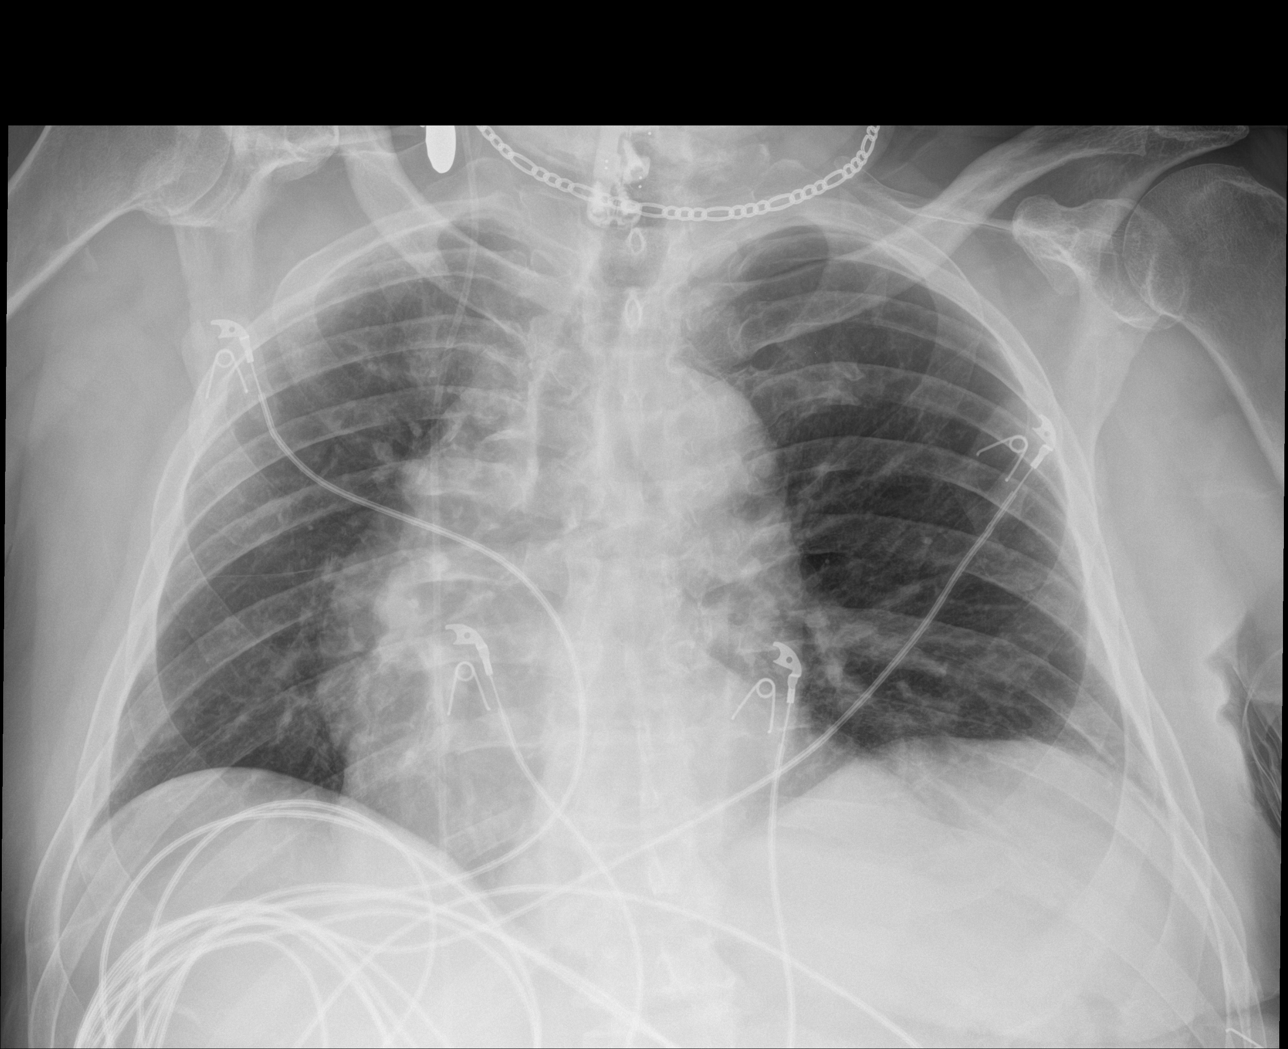

[abdomen erect]
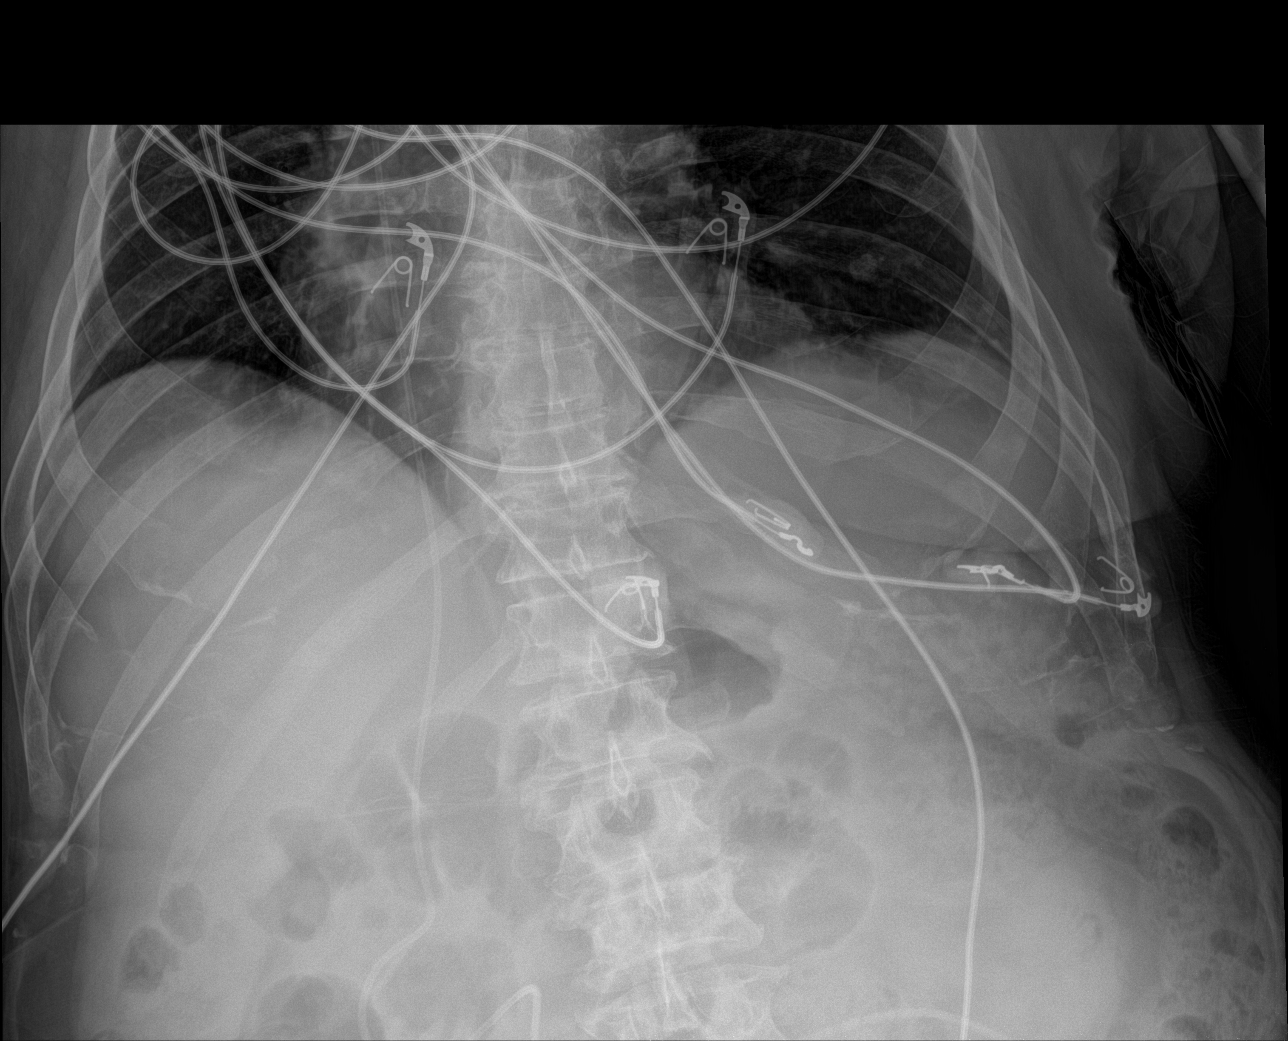

[abdomen kub (1 of 2)]
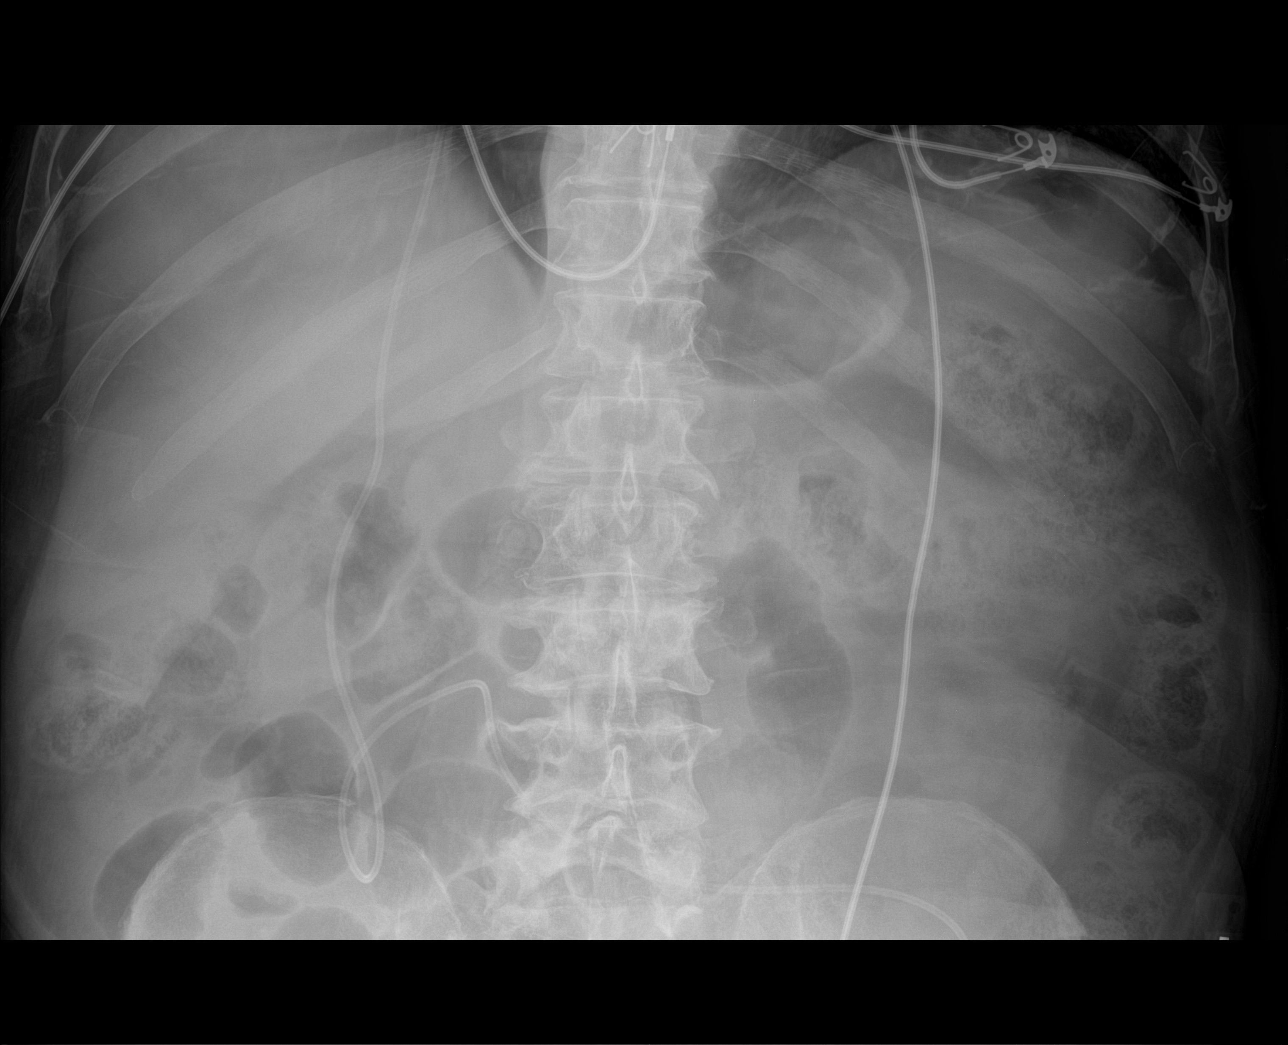

[abdomen kub (2 of 2)]
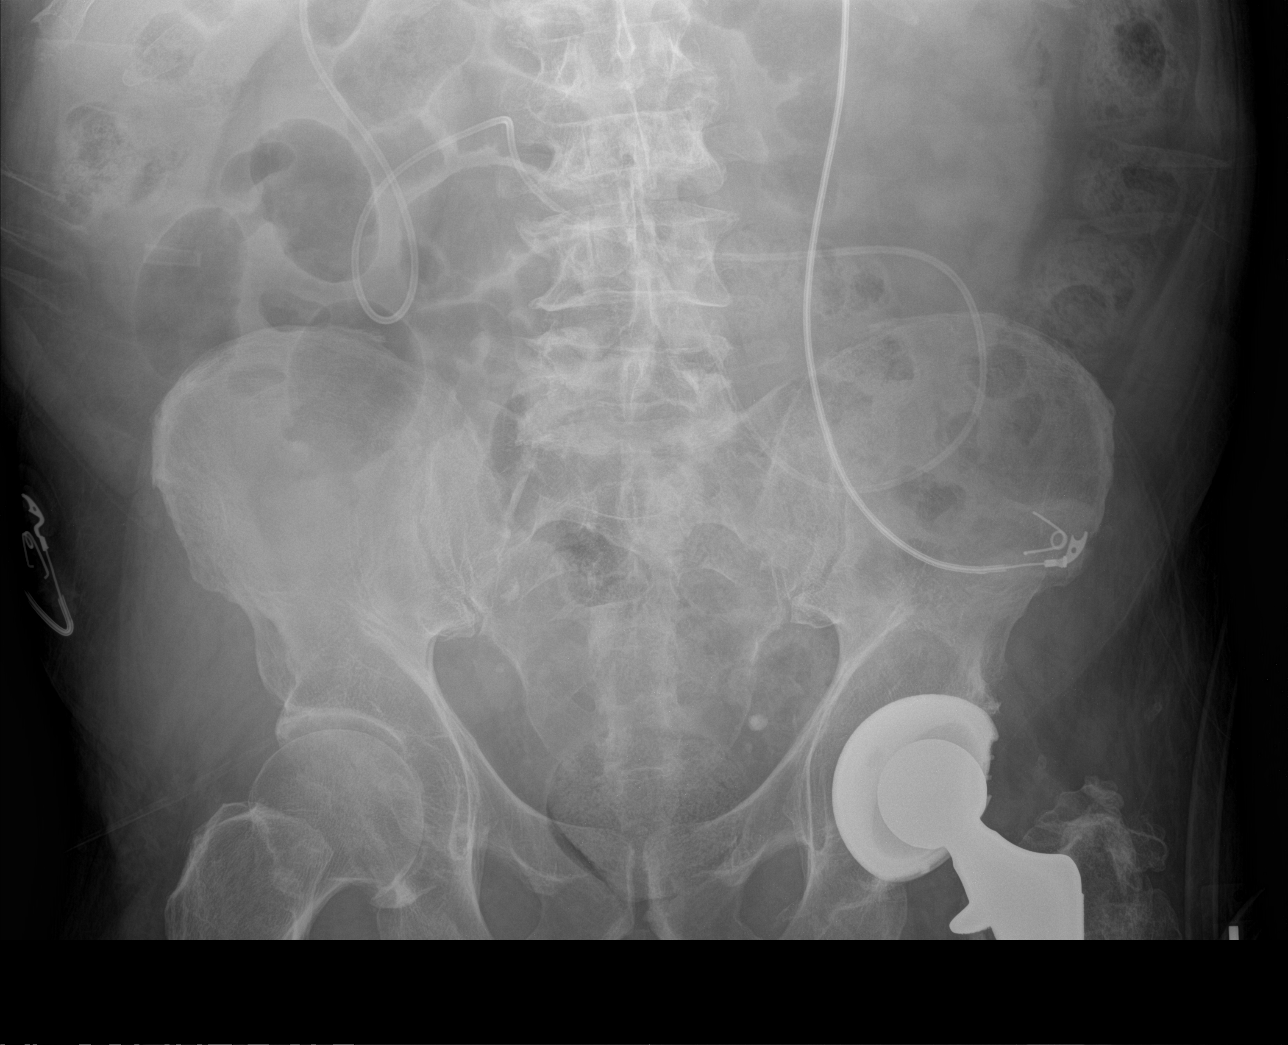

[4 of 4 positions shown; findings below may reference images not displayed]

FINDINGS: Heart size and vascularity normal. Lungs are clear without
infiltrate or effusion.

VP shunt tubing in the right chest extending into the mid abdomen
without discontinuity.

Normal bowel gas pattern. No bowel obstruction ileus or free air.
Left hip replacement.
IMPRESSION: Negative abdominal radiographs.  No acute cardiopulmonary disease.

## 2022-01-07 DIAGNOSIS — N179 Acute kidney failure, unspecified: Secondary | ICD-10-CM | POA: Diagnosis not present

## 2022-01-07 DIAGNOSIS — B964 Proteus (mirabilis) (morganii) as the cause of diseases classified elsewhere: Secondary | ICD-10-CM | POA: Diagnosis not present

## 2022-01-07 DIAGNOSIS — I700 Atherosclerosis of aorta: Secondary | ICD-10-CM | POA: Diagnosis not present

## 2022-01-12 DIAGNOSIS — N183 Chronic kidney disease, stage 3 unspecified: Secondary | ICD-10-CM | POA: Diagnosis not present

## 2022-01-12 DIAGNOSIS — I10 Essential (primary) hypertension: Secondary | ICD-10-CM | POA: Diagnosis not present

## 2022-01-18 DIAGNOSIS — F0284 Dementia in other diseases classified elsewhere, unspecified severity, with anxiety: Secondary | ICD-10-CM | POA: Diagnosis not present

## 2022-01-18 DIAGNOSIS — F0283 Dementia in other diseases classified elsewhere, unspecified severity, with mood disturbance: Secondary | ICD-10-CM | POA: Diagnosis not present

## 2022-01-18 DIAGNOSIS — F32A Depression, unspecified: Secondary | ICD-10-CM | POA: Diagnosis not present

## 2022-01-27 DIAGNOSIS — N179 Acute kidney failure, unspecified: Secondary | ICD-10-CM | POA: Diagnosis not present

## 2022-01-27 DIAGNOSIS — Z79899 Other long term (current) drug therapy: Secondary | ICD-10-CM | POA: Diagnosis not present

## 2022-01-27 DIAGNOSIS — I700 Atherosclerosis of aorta: Secondary | ICD-10-CM | POA: Diagnosis not present

## 2022-02-03 DIAGNOSIS — N183 Chronic kidney disease, stage 3 unspecified: Secondary | ICD-10-CM | POA: Diagnosis not present

## 2022-05-06 DIAGNOSIS — I10 Essential (primary) hypertension: Secondary | ICD-10-CM | POA: Diagnosis not present

## 2022-05-06 DIAGNOSIS — Z23 Encounter for immunization: Secondary | ICD-10-CM | POA: Diagnosis not present

## 2022-05-06 DIAGNOSIS — N1832 Chronic kidney disease, stage 3b: Secondary | ICD-10-CM | POA: Diagnosis not present

## 2022-05-11 ENCOUNTER — Other Ambulatory Visit: Payer: Self-pay | Admitting: Urology

## 2022-06-14 NOTE — Progress Notes (Signed)
COVID Vaccine received:  _0  No _1  Yes Date of any COVID positive Test in last 90 days:  None  PCP - Asencion Noble, III MD [place], [place] [address]   [phone] (Work)   [phone] (Fax)    Cardiologist - none Neurology: Romeo Apple,  MD   [place] [facility] [address]   [place], [place] [address]   [phone] (Work)   [phone] (Fax)    Chest x-ray - 10-29-19  1 view EKG -  10-29-2019  will repeat at PST Stress Test -  ECHO -  Cardiac Cath -   PCR screen: _2  Ordered & Completed                      _3   No Order but Needs PROFEND                      _4   N/A for this surgery  Surgery Plan:  _5  Ambulatory  _6  Outpatient in bed  _7  Admit Anesthesia:    _8  General  _9  Spinal  _10   Choice _11   MAC  Bowel Prep - _12  No  _13   Yes _____________  Pacemaker/ICD device     _14  N/A Spinal Cord Stimulator:_15  No _16  Yes      (Remind patient to bring remote DOS) Other Implants:   History of Sleep Apnea? _17  No _18  Yes   CPAP used?- _19  No _20  Yes  (Instruct to bring their mask & Tubing)  Does the patient monitor blood sugar? _21  No _22  Yes  _23  N/A  Blood Thinner / Instructions: none Aspirin Instructions: none  ERAS Protocol Ordered: _24  No  _25  Yes Patient is NPO  Comments: Patient also sees doctors at the [facility] in [place], [place],  Patient has dementia and needs his wife or daughter to be with him  Activity level: Patient can not climb a flight of stairs without difficulty; due to his Parkinson's, Lewy Body demenia and SOB,   Patient can not perform ADLs without assistance.   Anesthesia review: VP Shunt (Last revised on 12-17-2020 at [facility]) Parkinson's disease, Lewy Body Dementia, Non-Hodgkin's lymphoma, CKD3-4,    "Woke up during lymph node removal, facial surgery"    Patient denies shortness of breath, fever, cough and chest pain at PAT appointment.  Patient verbalized understanding and agreement to the Pre-Surgical Instructions that were given to  them at this PAT appointment. Patient was also educated of the need to review these PAT instructions again prior to his/her surgery.I reviewed the appropriate phone numbers to call if they have any and questions or concerns.

## 2022-06-14 NOTE — Patient Instructions (Signed)
SURGICAL WAITING ROOM VISITATION Patients having surgery or a procedure may have no more than 2 support people in the waiting area - these visitors may rotate in the visitor waiting room.   Children under the age of 45 must have an adult with them who is not the patient. If the patient needs to stay at the hospital during part of their recovery, the visitor guidelines for inpatient rooms apply.  PRE-OP VISITATION  Pre-op nurse will coordinate an appropriate time for 1 support person to accompany the patient in pre-op.  This support person may not rotate.  This visitor will be contacted when the time is appropriate for the visitor to come back in the pre-op area.  Please refer to the [facility] website for the visitor guidelines for Inpatients (after your surgery is over and you are in a regular room).  You are not required to quarantine at this time prior to your surgery. However, you must do this: Hand Hygiene often Do NOT share personal items Notify your provider if you are in close contact with someone who has COVID or you develop fever 100.4 or greater, new onset of sneezing, cough, sore throat, shortness of breath or body aches.  If you test positive for Covid or have been in contact with anyone that has tested positive in the last 10 days please notify you surgeon.    Your procedure is scheduled on:  Friday  June 24, 2022  Report to [facility] Main Entrance: [place] entrance where the [employer] is available.   Report to admitting at: 06:45 AM  +++++Call this number if you have any questions or problems the morning of surgery [phone]   Do not eat or drink anything after Midnight the night prior to your surgery/procedure.  FOLLOW BOWEL PREP AND ANY ADDITIONAL PRE OP INSTRUCTIONS YOU RECEIVED FROM YOUR SURGEON'S OFFICE!!!    Oral Hygiene is also important to reduce your risk of infection.        Remember - BRUSH YOUR TEETH THE MORNING OF SURGERY WITH YOUR  REGULAR TOOTHPASTE   Take ONLY these medicines the morning of surgery with A SIP OF WATER: Sinemet, allopurinol, amlodipine                     You may not have any metal on your body including  jewelry, and body piercing  Do not wear  lotions, powders, cologne, or deodorant  Men may shave face and neck.  Contacts, Hearing Aids, dentures or bridgework may not be worn into surgery.   Patients discharged on the day of surgery will not be allowed to drive home.  Someone NEEDS to stay with you for the first 24 hours after anesthesia.  Do not bring your home medications to the hospital. The Pharmacy will dispense medications listed on your medication list to you during your admission in the Hospital.  Please read over the following fact sheets you were given: IF YOU HAVE QUESTIONS ABOUT YOUR PRE-OP INSTRUCTIONS, PLEASE CALL [phone]  ([place])   [place] - Preparing for Surgery Before surgery, you can play an important role.  Because skin is not sterile, your skin needs to be as free of germs as possible.  You can reduce the number of germs on your skin by washing with CHG (chlorahexidine gluconate) soap before surgery.  CHG is an antiseptic cleaner which kills germs and bonds with the skin to continue killing germs even after washing. Please DO NOT use if  you have an allergy to CHG or antibacterial soaps.  If your skin becomes reddened/irritated stop using the CHG and inform your nurse when you arrive at Short Stay. Do not shave (including legs and underarms) for at least 48 hours prior to the first CHG shower.  You may shave your face/neck.  Please follow these instructions carefully:  1.  Shower with CHG Soap the night before surgery and the  morning of surgery.  2.  If you choose to wash your hair, wash your hair first as usual with your normal  shampoo.  3.  After you shampoo, rinse your hair and body thoroughly to remove the shampoo.                             4.  Use CHG as you  would any other liquid soap.  You can apply chg directly to the skin and wash.  Gently with a scrungie or clean washcloth.  5.  Apply the CHG Soap to your body ONLY FROM THE NECK DOWN.   Do not use on face/ open                           Wound or open sores. Avoid contact with eyes, ears mouth and genitals (private parts).                       Wash face,  Genitals (private parts) with your normal soap.             6.  Wash thoroughly, paying special attention to the area where your  surgery  will be performed.  7.  Thoroughly rinse your body with warm water from the neck down.  8.  DO NOT shower/wash with your normal soap after using and rinsing off the CHG Soap.            9.  Pat yourself dry with a clean towel.            10.  Wear clean pajamas.            11.  Place clean sheets on your bed the night of your first shower and do not  sleep with pets.  ON THE DAY OF SURGERY : Do not apply any lotions/deodorants the morning of surgery.  Please wear clean clothes to the hospital/surgery center.    FAILURE TO FOLLOW THESE INSTRUCTIONS MAY RESULT IN THE CANCELLATION OF YOUR SURGERY  PATIENT SIGNATURE_________________________________  NURSE SIGNATURE__________________________________  ________________________________________________________________________

## 2022-06-15 ENCOUNTER — Encounter (HOSPITAL_COMMUNITY): Payer: Self-pay

## 2022-06-15 ENCOUNTER — Other Ambulatory Visit: Payer: Self-pay

## 2022-06-15 ENCOUNTER — Encounter (HOSPITAL_COMMUNITY)
Admission: RE | Admit: 2022-06-15 | Discharge: 2022-06-15 | Disposition: A | Discharge status: Home / Self Care | Payer: No Typology Code available for payment source | Source: Ambulatory Visit | Attending: Urology | Admitting: Urology

## 2022-06-15 VITALS — BP 134/78 | HR 70 | Temp 98.8°F | Resp 20 | Ht 65.0 in

## 2022-06-15 DIAGNOSIS — Z01818 Encounter for other preprocedural examination: Secondary | ICD-10-CM | POA: Diagnosis present

## 2022-06-15 DIAGNOSIS — I10 Essential (primary) hypertension: Secondary | ICD-10-CM | POA: Insufficient documentation

## 2022-06-15 HISTORY — DX: Myoneural disorder, unspecified: G70.9

## 2022-06-15 LAB — CBC
HCT: 44.6 % (ref 39.0–52.0)
Hemoglobin: 15 g/dL (ref 13.0–17.0)
MCH: 31.7 pg (ref 26.0–34.0)
MCHC: 33.6 g/dL (ref 30.0–36.0)
MCV: 94.3 fL (ref 80.0–100.0)
Platelets: 285 10*3/uL (ref 150–400)
RBC: 4.73 MIL/uL (ref 4.22–5.81)
RDW: 13.2 % (ref 11.5–15.5)
WBC: 8 10*3/uL (ref 4.0–10.5)
nRBC: 0 % (ref 0.0–0.2)

## 2022-06-15 LAB — BASIC METABOLIC PANEL
Anion gap: 10 (ref 5–15)
BUN: 45 mg/dL — ABNORMAL HIGH (ref 8–23)
CO2: 25 mmol/L (ref 22–32)
Calcium: 9.9 mg/dL (ref 8.9–10.3)
Chloride: 106 mmol/L (ref 98–111)
Creatinine, Ser: 2.24 mg/dL — ABNORMAL HIGH (ref 0.61–1.24)
GFR, Estimated: 30 mL/min — ABNORMAL LOW (ref >60)
Glucose, Bld: 69 mg/dL — ABNORMAL LOW (ref 70–99)
Potassium: 3.9 mmol/L (ref 3.5–5.1)
Sodium: 141 mmol/L (ref 135–145)

## 2022-06-23 NOTE — Anesthesia Preprocedure Evaluation (Addendum)
Anesthesia Evaluation  Patient identified by MRN, date of birth, ID band Patient awake    Reviewed: Allergy & Precautions, NPO status , Patient's Chart, lab work & pertinent test results  Airway Mallampati: II  TM Distance: >3 FB Neck ROM: Full    Dental no notable dental hx. (+) Dental Advisory Given, Implants   Pulmonary former smoker   Pulmonary exam normal        Cardiovascular hypertension, Pt. on medications Normal cardiovascular exam     Neuro/Psych  PSYCHIATRIC DISORDERS Anxiety Depression   Dementia negative neurological ROS     GI/Hepatic Neg liver ROS,,,  Endo/Other  negative endocrine ROS    Renal/GU      Musculoskeletal negative musculoskeletal ROS (+)    Abdominal   Peds  Hematology  (+) Blood dyscrasia, Sickle cell trait Hx of lymphoma    Anesthesia Other Findings   Reproductive/Obstetrics                             Anesthesia Physical Anesthesia Plan  ASA: 3  Anesthesia Plan: General   Post-op Pain Management: Tylenol PO (pre-op)*   Induction: Intravenous  PONV Risk Score and Plan: 2 and Ondansetron and Dexamethasone  Airway Management Planned: LMA  Additional Equipment:   Intra-op Plan:   Post-operative Plan: Extubation in OR  Informed Consent: I have reviewed the patients History and Physical, chart, labs and discussed the procedure including the risks, benefits and alternatives for the proposed anesthesia with the patient or authorized representative who has indicated his/her understanding and acceptance.     Dental advisory given  Plan Discussed with: Anesthesiologist and CRNA  Anesthesia Plan Comments:        Anesthesia Quick Evaluation

## 2022-06-24 ENCOUNTER — Ambulatory Visit (HOSPITAL_BASED_OUTPATIENT_CLINIC_OR_DEPARTMENT_OTHER): Payer: No Typology Code available for payment source | Admitting: Certified Registered Nurse Anesthetist

## 2022-06-24 ENCOUNTER — Ambulatory Visit (HOSPITAL_COMMUNITY): Payer: No Typology Code available for payment source | Admitting: Physician Assistant

## 2022-06-24 ENCOUNTER — Encounter (HOSPITAL_COMMUNITY): Payer: Self-pay | Admitting: Urology

## 2022-06-24 ENCOUNTER — Ambulatory Visit (HOSPITAL_COMMUNITY): Payer: No Typology Code available for payment source

## 2022-06-24 ENCOUNTER — Ambulatory Visit (HOSPITAL_COMMUNITY)
Admission: RE | Admit: 2022-06-24 | Discharge: 2022-06-24 | Disposition: A | Discharge status: Home / Self Care | Payer: No Typology Code available for payment source | Source: Ambulatory Visit | Attending: Urology | Admitting: Urology

## 2022-06-24 ENCOUNTER — Encounter (HOSPITAL_COMMUNITY)
Admission: RE | Disposition: A | Discharge status: Home / Self Care | Payer: Self-pay | Source: Ambulatory Visit | Attending: Urology

## 2022-06-24 DIAGNOSIS — N401 Enlarged prostate with lower urinary tract symptoms: Secondary | ICD-10-CM | POA: Insufficient documentation

## 2022-06-24 DIAGNOSIS — G3183 Dementia with Lewy bodies: Secondary | ICD-10-CM | POA: Insufficient documentation

## 2022-06-24 DIAGNOSIS — N261 Atrophy of kidney (terminal): Secondary | ICD-10-CM | POA: Diagnosis not present

## 2022-06-24 DIAGNOSIS — G20A1 Parkinson's disease without dyskinesia, without mention of fluctuations: Secondary | ICD-10-CM | POA: Diagnosis not present

## 2022-06-24 DIAGNOSIS — N179 Acute kidney failure, unspecified: Secondary | ICD-10-CM | POA: Insufficient documentation

## 2022-06-24 DIAGNOSIS — F0280 Dementia in other diseases classified elsewhere without behavioral disturbance: Secondary | ICD-10-CM | POA: Insufficient documentation

## 2022-06-24 DIAGNOSIS — Z87891 Personal history of nicotine dependence: Secondary | ICD-10-CM

## 2022-06-24 DIAGNOSIS — Q6211 Congenital occlusion of ureteropelvic junction: Secondary | ICD-10-CM

## 2022-06-24 DIAGNOSIS — N1330 Unspecified hydronephrosis: Secondary | ICD-10-CM | POA: Diagnosis not present

## 2022-06-24 DIAGNOSIS — I10 Essential (primary) hypertension: Secondary | ICD-10-CM | POA: Insufficient documentation

## 2022-06-24 DIAGNOSIS — N131 Hydronephrosis with ureteral stricture, not elsewhere classified: Secondary | ICD-10-CM | POA: Diagnosis present

## 2022-06-24 DIAGNOSIS — D573 Sickle-cell trait: Secondary | ICD-10-CM | POA: Insufficient documentation

## 2022-06-24 DIAGNOSIS — R338 Other retention of urine: Secondary | ICD-10-CM | POA: Diagnosis not present

## 2022-06-24 HISTORY — PX: CYSTOSCOPY W/ URETERAL STENT PLACEMENT: SHX1429

## 2022-06-24 SURGERY — CYSTOSCOPY, WITH RETROGRADE PYELOGRAM AND URETERAL STENT INSERTION
Anesthesia: General | Laterality: Left

## 2022-06-24 MED ORDER — IOHEXOL 300 MG/ML  SOLN
INTRAMUSCULAR | Status: DC | PRN
  Administered 2022-06-24: 10 mL

## 2022-06-24 MED ORDER — LIDOCAINE 2% (20 MG/ML) 5 ML SYRINGE
INTRAMUSCULAR | Status: DC | PRN
  Administered 2022-06-24: 100 mg via INTRAVENOUS

## 2022-06-24 MED ORDER — ACETAMINOPHEN 500 MG PO TABS
1000.0000 mg | ORAL_TABLET | Freq: Once | ORAL | Status: AC
  Administered 2022-06-24: 1000 mg via ORAL
  Filled 2022-06-24: qty 2

## 2022-06-24 MED ORDER — DEXAMETHASONE SODIUM PHOSPHATE 4 MG/ML IJ SOLN
INTRAMUSCULAR | Status: DC | PRN
  Administered 2022-06-24: 5 mg via INTRAVENOUS

## 2022-06-24 MED ORDER — PROPOFOL 10 MG/ML IV BOLUS
INTRAVENOUS | Status: AC
  Filled 2022-06-24: qty 20

## 2022-06-24 MED ORDER — PROPOFOL 10 MG/ML IV BOLUS
INTRAVENOUS | Status: DC | PRN
  Administered 2022-06-24: 90 mg via INTRAVENOUS

## 2022-06-24 MED ORDER — ONDANSETRON HCL 4 MG/2ML IJ SOLN
INTRAMUSCULAR | Status: DC | PRN
  Administered 2022-06-24: 4 mg via INTRAVENOUS

## 2022-06-24 MED ORDER — ORAL CARE MOUTH RINSE: 15.0000 mL | Freq: Once | OROMUCOSAL | Status: AC

## 2022-06-24 MED ORDER — SODIUM CHLORIDE 0.9 % IR SOLN
Status: DC | PRN
  Administered 2022-06-24: 3000 mL via INTRAVESICAL

## 2022-06-24 MED ORDER — DEXAMETHASONE SODIUM PHOSPHATE 10 MG/ML IJ SOLN
INTRAMUSCULAR | Status: AC
  Filled 2022-06-24: qty 1

## 2022-06-24 MED ORDER — LACTATED RINGERS IV SOLN: INTRAVENOUS | Status: DC

## 2022-06-24 MED ORDER — CEFAZOLIN SODIUM-DEXTROSE 2-4 GM/100ML-% IV SOLN
2.0000 g | Freq: Once | INTRAVENOUS | Status: AC
  Administered 2022-06-24: 2 g via INTRAVENOUS
  Filled 2022-06-24: qty 100

## 2022-06-24 MED ORDER — CHLORHEXIDINE GLUCONATE 0.12 % MT SOLN
15.0000 mL | Freq: Once | OROMUCOSAL | Status: AC
  Administered 2022-06-24: 15 mL via OROMUCOSAL

## 2022-06-24 MED ORDER — ONDANSETRON HCL 4 MG/2ML IJ SOLN
INTRAMUSCULAR | Status: AC
  Filled 2022-06-24: qty 2

## 2022-06-24 MED ORDER — LIDOCAINE HCL (PF) 2 % IJ SOLN
INTRAMUSCULAR | Status: AC
  Filled 2022-06-24: qty 5

## 2022-06-24 SURGICAL SUPPLY — 14 items
BAG URO CATCHER STRL LF (MISCELLANEOUS) ×1 IMPLANT
CATH URETL OPEN 5X70 (CATHETERS) IMPLANT
CLOTH BEACON ORANGE TIMEOUT ST (SAFETY) ×1 IMPLANT
GLOVE BIOGEL M 7.0 STRL (GLOVE) ×1 IMPLANT
GOWN STRL REUS W/ TWL LRG LVL3 (GOWN DISPOSABLE) ×1 IMPLANT
GOWN STRL REUS W/TWL LRG LVL3 (GOWN DISPOSABLE) ×1
GUIDEWIRE STR DUAL SENSOR (WIRE) ×1 IMPLANT
GUIDEWIRE ZIPWRE .038 STRAIGHT (WIRE) IMPLANT
MANIFOLD NEPTUNE II (INSTRUMENTS) ×1 IMPLANT
PACK CYSTO (CUSTOM PROCEDURE TRAY) ×1 IMPLANT
STENT URET 6FRX26 CONTOUR (STENTS) IMPLANT
SYR 10ML LL (SYRINGE) ×1 IMPLANT
TUBING CONNECTING 10 (TUBING) ×1 IMPLANT
TUBING UROLOGY SET (TUBING) IMPLANT

## 2022-06-24 NOTE — Consult Note (Deleted)
Office Visit Report     06/07/2022   --------------------------------------------------------------------------------  --------------------------------------------------------------------------------   CC/HPI: Albert Hahn is a 76 year old male seen in followup today for chronic left UPJ obstruction.   He has a past medical history for Lewy body dementia, Parkinson's disease and hypertension.   #1. Left UPJ obstruction:  He was sent to the ED from his primary care office with reports of acute renal failure in 12/2021. Creatinine was 2.49 according to a EGFR of 26 from a baseline of 1.6 in 02/2021. CT A/P 12/30/2021 incidentally revealed chronic severe left-sided hydronephrosis with progressive renal cortical atrophy consistent with a chronic UPJ obstruction. There were no ureteral stones or masses identified. Of note, reviewing his prior imaging, there has been evidence of chronic hydronephrosis in the past. He does report a history of urolithiasis and in 2018 had a punctate right lower pole stone. He denies passing any stones over the past several years. There is no stones or any current imaging. He denies significant bothersome abdominal pain or flank pain.  -S/p left ureteral stent placement on 12/30/2021 with severe left hydronephrosis and tortuous left ureter.  -His renal function initially did not improve much with creatinine on 5/20 at 2.23 equating to a GFR of 30.  -Most recent creatinine was 1.8 in 01/2022 equating to GFR of 36.  -He has been tolerating his stent well. He denies fevers, chills, abdominal pain or flank pain.   #2. BPH/LUTS: At baseline, he does state that he is weak flow of stream with sensation incomplete bladder emptying and sometimes incontinence. Unclear if this is overflow incontinence. He previously saw Dr. Alyson Ingles in 2019 with BPH/LUTS and was on tamsulosin and finasteride. He also had a negative gross hematuria evaluation in 2019.  -He denies taking any medication for  the urinary tract symptoms at this time and reports no bother.   Patient currently denies fever, chills, sweats, nausea, vomiting, abdominal or flank pain, gross hematuria or dysuria.   06/07/2022: Patient with above-noted history, last seen on September 12. Here today for preoperative appointment prior to undergoing repeat cystoscopy with left retrograde pyelogram and stent exchange with Dr. Abner Greenspan on 11/10. Patient here today with 2 family members. Tolerating indwelling stent as expected but fairly well without any noted complication. He does continue with some underlying lower urinary tract symptoms including weak stream, mixed incontinence but this is grossly stable and consistent with prior baseline. Not overly bothersome per patient report. Patient currently denies fever, chills, sweats, nausea, vomiting, abdominal or flank pain, gross hematuria or dysuria. Correlated with family members, no changes in past medical history, prescription medications taken on a daily basis, no interval surgical or procedural intervention, no interval treatment for UTI or other infectious process.     ALLERGIES: Bee stings Codeine Derivatives Shellfish    MEDICATIONS: Hydrochlorothiazide 25 mg tablet 1 tablet PO Daily  Terazosin Hcl  Bupropion Hcl 75 mg tablet Oral  Diazepam 5 mg tablet Oral  Temazepam 30 mg capsule Oral     GU PSH: Cystoscopy - 2018       PSH Notes: Hemorrhoidectomy, Inguinal Hernia Repair, Back Surgery  Brain Shunts  Non Hodgkin lymphoma  hip replacement     NON-GU PSH: Hemorrhoidectomy (favorite) - 2008     GU PMH: BPH w/LUTS - 04/26/2022, - 01/13/2022 Hydronephrosis - 04/26/2022, - 01/13/2022 Urinary Frequency - 04/26/2022, - 01/13/2022 Incontinence w/o Sensation - 01/13/2022 BPH w/o LUTS - 2019 Gross hematuria - 2019, - 2018 Nocturia, Nocturia - 2014 Overactive bladder,  Overactive bladder - 2014 Urinary Urgency, Urinary urgency - 2014 Weak Urinary Stream, Weak urinary stream -  2014      PMH Notes:  2006-09-21 15:15:49 - Note: Penile Pain  1898-08-15 00:00:00 - Note: Normal Routine History And Physical Adult  2006-09-21 15:15:49 - Note: Delays In Starting Urination (Hesitancy)  B-cell lymphoma   NON-GU PMH: Personal history of other mental and behavioral disorders, History of depression - 2014 Psychogenic ED, Impotence, psychogenic - 2014 Anxiety Arthritis Depression GERD Hydrocephalus, unspecified Hypertension Parkinson's disease    FAMILY HISTORY: 2 daughters - Runs in Family Colon Cancer - Father Diabetes - Runs in [place] Status Number - Father Hypertension - Runs in Family   SOCIAL HISTORY: Marital Status: Married Ethnicity: Not Hispanic Or Latino; Race: White Current Smoking Status: Patient does not smoke anymore. Has not smoked since 01/14/1999. Smoked for 40 years.   Tobacco Use Assessment Completed: Used Tobacco in last 30 days? Does not drink anymore.  Drinks 3 caffeinated drinks per day. 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     Notes: Tobacco Use, Caffeine Use, Death In The Family Father, Death In The Family Mother   REVIEW OF SYSTEMS:    GU Review Male:   Patient denies frequent urination, hard to postpone urination, burning/ pain with urination, get up at night to urinate, leakage of urine, stream starts and stops, trouble starting your stream, have to strain to urinate , erection problems, and penile pain.  Gastrointestinal (Upper):   Patient denies nausea, vomiting, and indigestion/ heartburn.  Gastrointestinal (Lower):   Patient denies diarrhea and constipation.  Constitutional:   Patient denies fever, night sweats, weight loss, and fatigue.  Skin:   Patient denies skin rash/ lesion and itching.  Eyes:   Patient denies blurred vision and double vision.  Ears/ Nose/ Throat:   Patient denies sore throat and sinus problems.  Hematologic/Lymphatic:   Patient denies swollen glands and easy bruising.   Cardiovascular:   Patient denies leg swelling and chest pains.  Respiratory:   Patient denies shortness of breath and cough.  Endocrine:   Patient denies excessive thirst.  Musculoskeletal:   Patient denies back pain and joint pain.  Neurological:   Patient denies headaches and dizziness.  Psychologic:   Patient denies depression and anxiety.   VITAL SIGNS:      06/07/2022 11:49 AM  Weight 186 lb / 84.37 kg  Height 67 in / 170.18 cm  BP 166/89 mmHg  Pulse 83 /min  Temperature 97.3 F / 36.2 C  BMI 29.1 kg/m   MULTI-SYSTEM PHYSICAL EXAMINATION:    Constitutional: Well-nourished. No physical deformities. Normally developed. Good grooming. Ambulating with use of rolling walker.  Neck: Neck symmetrical, not swollen. Normal tracheal position.  Respiratory: No labored breathing, no use of accessory muscles.   Cardiovascular: Normal temperature, normal extremity pulses, no swelling, no varicosities.  Skin: No paleness, no jaundice, no cyanosis. No lesion, no ulcer, no rash.  Neurologic / Psychiatric: Stigmata of underlying cognitive dysfunction present, stable. Mentating at baseline today per family member report.  Gastrointestinal: No mass, no tenderness, no rigidity, non obese abdomen.  Musculoskeletal: Normal gait and station of head and neck.     Complexity of Data:  Source Of History:  Patient, Family/Caregiver, Medical Record Summary  Lab Test Review:   BMP, CBC with Diff, CMP  Records Review:   Previous Doctor Records, Previous Hospital Records, Previous Patient Records  Urine Test Review:   Urinalysis, Urine Culture  X-Ray Review: C.T.  Abdomen/Pelvis: Reviewed Films. Reviewed Report.     06/07/22  Urinalysis  Urine Appearance Clear   Urine Color Yellow   Urine Glucose Neg mg/dL  Urine Bilirubin Neg mg/dL  Urine Ketones Neg mg/dL  Urine Specific Gravity 1.030   Urine Blood 2+ ery/uL  Urine pH 5.5   Urine Protein 2+ mg/dL  Urine Urobilinogen 0.2 mg/dL  Urine Nitrites  Neg   Urine Leukocyte Esterase Neg leu/uL  Urine WBC/hpf 0 - 5/hpf   Urine RBC/hpf 3 - 10/hpf   Urine Epithelial Cells NS (Not Seen)   Urine Bacteria Rare (0-9/hpf)   Urine Mucous Not Present   Urine Yeast NS (Not Seen)   Urine Trichomonas Not Present   Urine Cystals NS (Not Seen)   Urine Casts Granular   Urine Sperm Not Present    PROCEDURES:          Urinalysis w/Scope - 81001 Dipstick Dipstick Cont'd Micro  Color: Yellow Bilirubin: Neg WBC/hpf: 0 - 5/hpf  Appearance: Clear Ketones: Neg RBC/hpf: 3 - 10/hpf  Specific Gravity: 1.030 Blood: 2+ Bacteria: Rare (0-9/hpf)  pH: 5.5 Protein: 2+ Cystals: NS (Not Seen)  Glucose: Neg Urobilinogen: 0.2 Casts: Granular    Nitrites: Neg Trichomonas: Not Present    Leukocyte Esterase: Neg Mucous: Not Present      Epithelial Cells: NS (Not Seen)      Yeast: NS (Not Seen)      Sperm: Not Present    Notes:      ASSESSMENT:      ICD-10 Details  1 GU:   Hydronephrosis - N13.0 Left, Chronic, Threat to Bodily Function  2   BPH w/LUTS - N40.1 Chronic, Stable  3   Urinary Frequency - R35.0 Chronic, Stable   PLAN:           Orders Labs Urine Culture          Schedule Return Visit/Planned Activity: Keep Scheduled Appointment - Follow up MD, Schedule Surgery          Document Letter(s):  Created for Patient: Clinical Summary         Notes:   All questions answered to the best of my ability regarding the upcoming procedure and expected postoperative course with understanding expressed by patient and family members present today. He is going to be assisted to the bathroom prior to check out in an attempt to obtain a voided urine specimen so that urine culture can be sent to serve as a baseline. He will proceed with previously scheduled cystoscopy, retrograde pyelogram and left ureteral stent exchange on 11/10.        Next Appointment:      Next Appointment: 06/24/2022 09:00 AM    Appointment Type: Surgery     Location: [facility] [phone]    Provider: Rexene Alberts, M.D.    Reason for Visit: [facility]/OP CYSTO LT RPG, LT [place]      Urology Preoperative H&P   Chief Complaint: Left UPJ obstruction  History of Present Illness: Albert Hahn is a 76 y.o. male with left UPJ obstruction here for left ureteral stent exchange. Denies fevers, chills, dysuria.    Past Medical History:  Diagnosis Date   Anxiety    Arthritis    Complication of anesthesia    woke up during lymph node removal, facial surgery   Depression    H/O emphysema ([place])    Headache(784.0)    History of hiatal hernia    "doesnt bother me"  Hypertension    Neuromuscular disorder ([place])    Parkinson's disease,  Lewy Body dementia   nhl dx'd 07/2006   xrt/ chemo  for B-Cell lymphoma   PTSD (post-traumatic stress disorder)     Past Surgical History:  Procedure Laterality Date   BACK SURGERY     L3-L4 Fusion per pt   BRAIN SURGERY     COLONOSCOPY W/ POLYPECTOMY     CYSTOSCOPY W/ URETERAL STENT PLACEMENT Left 12/30/2021   Procedure: CYSTOSCOPY WITH RETROGRADE PYELOGRAM/URETERAL STENT PLACEMENT;  Surgeon: Janith Lima, MD;  Location: [facility];  Service: Urology;  Laterality: Left;   HERNIA REPAIR     JOINT REPLACEMENT     LUMBAR PUNCTURE  07/23/2012   severe headaches   NECK SURGERY     metal plates and screws in neck    TOTAL HIP ARTHROPLASTY Left    VENTRICULOPERITONEAL SHUNT N/A 11/25/2016   Procedure: SHUNT INSERTION VENTRICULAR-PERITONEAL;  Surgeon: Eustace Moore, MD;  Location: [facility];  Service: Neurosurgery;  Laterality: N/A;    Allergies:  Allergies  Allergen Reactions   Bee Venom Anaphylaxis   Shellfish Allergy Anaphylaxis   Wasp Venom Anaphylaxis   Other Nausea And Vomiting    Unknown anesthetic spray - nausea/vomiting   Codeine Nausea And Vomiting    Family History  Problem Relation Age of Onset   Healthy Mother    Healthy Father     Social History:  reports that he has quit smoking. He has  never used smokeless tobacco. He reports that he does not drink alcohol and does not use drugs.  ROS: A complete review of systems was performed.  All systems are negative except for pertinent findings as noted.  Physical Exam:  Vital signs in last 24 hours: Temp:  [97.9 F (36.6 C)] 97.9 F (36.6 C) (11/10 0622) Pulse Rate:  [79] 79 (11/10 0622) Resp:  [20] 20 (11/10 0622) BP: (155)/(87) 155/87 (11/10 0622) SpO2:  [99 %] 99 % (11/10 0622) Weight:  [85.3 kg] 85.3 kg (11/10 0622) Constitutional:  Alert and oriented, No acute distress Cardiovascular: Regular rate and rhythm Respiratory: Normal respiratory effort, Lungs clear bilaterally GI: Abdomen is soft, nontender, nondistended, no abdominal masses GU: No CVA tenderness Lymphatic: No lymphadenopathy Neurologic: Grossly intact, no focal deficits Psychiatric: Normal mood and affect  Laboratory Data:  No results for input(s): "WBC", "HGB", "HCT", "PLT" in the last 72 hours.  No results for input(s): "NA", "K", "CL", "GLUCOSE", "BUN", "CALCIUM", "CREATININE" in the last 72 hours.  Invalid input(s): "CO3"   No results found for this or any previous visit (from the past 24 hour(s)). No results found for this or any previous visit (from the past 240 hour(s)).  Renal Function: No results for input(s): "CREATININE" in the last 168 hours. Estimated Creatinine Clearance: 28.6 mL/min (A) (by C-G formula based on SCr of 2.24 mg/dL (H)).  Radiologic Imaging: No results found.  I independently reviewed the above imaging studies.  Assessment and Plan Albert Hahn is a 76 y.o. male with left UPJ obstruction here for left ureteral stent exchange.  -The risks, benefits and alternatives of cystoscopy with  JJ stent exchange was discussed with the patient.  Risks include, but are not limited to: bleeding, urinary tract infection, ureteral injury, ureteral stricture disease, chronic pain, urinary symptoms, bladder injury, stent  migration, the need for nephrostomy tube placement, MI, CVA, DVT, PE and the inherent risks with general anesthesia.  The patient voices understanding and wishes to proceed.  Matt R. Chonita Gadea MD 06/24/2022, 6:56 AM  [facility] Pager: [phone]): [phone]

## 2022-06-24 NOTE — Anesthesia Postprocedure Evaluation (Signed)
Anesthesia Post Note  Patient: Albert Hahn  Procedure(s) Performed: CYSTOSCOPY WITH RETROGRADE PYELOGRAM/URETERAL STENT EXCHANGE (Left)     Patient location during evaluation: PACU Anesthesia Type: General Level of consciousness: sedated Pain management: pain level controlled Vital Signs Assessment: post-procedure vital signs reviewed and stable Respiratory status: spontaneous breathing and respiratory function stable Cardiovascular status: stable Postop Assessment: no apparent nausea or vomiting Anesthetic complications: no  No notable events documented.  Last Vitals:  Vitals:   06/24/22 0900 06/24/22 0915  BP: (!) 145/76 (!) 158/70  Pulse: 64 (!) 59  Resp: 15 17  Temp:    SpO2: 100% 99%    Last Pain:  Vitals:   06/24/22 0858  TempSrc:   PainSc: 0-No pain                 Tamryn Popko DANIEL

## 2022-06-24 NOTE — Transfer of Care (Signed)
Immediate Anesthesia Transfer of Care Note  Patient: Albert Hahn  Procedure(s) Performed: CYSTOSCOPY WITH RETROGRADE PYELOGRAM/URETERAL STENT EXCHANGE (Left)  Patient Location: PACU  Anesthesia Type:General  Level of Consciousness: awake and patient cooperative  Airway & Oxygen Therapy: Patient Spontanous Breathing and Patient connected to face mask  Post-op Assessment: Report given to RN and Post -op Vital signs reviewed and stable  Post vital signs: Reviewed and stable  Last Vitals:  Vitals Value Taken Time  BP    Temp    Pulse    Resp    SpO2      Last Pain:  Vitals:   06/24/22 0638  TempSrc:   PainSc: 0-No pain         Complications: No notable events documented.

## 2022-06-24 NOTE — Discharge Instructions (Signed)
[facility] [phone] Post Ureteroscopy With or Without Stent Instructions  Definitions:  Ureter: The duct that transports urine from the kidney to the bladder. Stent:   A plastic hollow tube that is placed into the ureter, from the kidney to the bladder to prevent the ureter from swelling shut.  GENERAL INSTRUCTIONS:  Despite the fact that no skin incisions were used, the area around the ureter and bladder is raw and irritated. The stent is a foreign body which will further irritate the bladder wall. This irritation is manifested by increased frequency of urination, both day and night, and by an increase in the urge to urinate. In some, the urge to urinate is present almost always. Sometimes the urge is strong enough that you may not be able to stop yourself from urinating. The only real cure is to remove the stent and then give time for the bladder wall to heal which can't be done until the danger of the ureter swelling shut has passed, which varies.  You may see some blood in your urine while the stent is in place and a few days afterwards. Do not be alarmed, even if the urine was clear for a while. Get off your feet and drink lots of fluids until clearing occurs. If you start to pass clots or don't improve, call us.  DIET: You may return to your normal diet immediately. Because of the raw surface of your bladder, alcohol, spicy foods, acid type foods and drinks with caffeine may cause irritation or frequency and should be used in moderation. To keep your urine flowing freely and to avoid constipation, drink plenty of fluids during the day ( 8-10 glasses ). Tip: Avoid cranberry juice because it is very acidic.  ACTIVITY: Your physical activity doesn't need to be restricted. However, if you are very active, you may see some blood in your urine. We suggest that you reduce your activity under these circumstances until the bleeding has stopped.  BOWELS: It is important to  keep your bowels regular during the postoperative period. Straining with bowel movements can cause bleeding. A bowel movement every other day is reasonable. Use a mild laxative if needed, such as Milk of Magnesia 2-3 tablespoons, or 2 Dulcolax tablets. Call if you continue to have problems. If you have been taking narcotics for pain, before, during or after your surgery, you may be constipated. Take a laxative if necessary.   MEDICATION: You should resume your pre-surgery medications unless told not to. In addition you will often be given an antibiotic to prevent infection. These should be taken as prescribed until the bottles are finished unless you are having an unusual reaction to one of the drugs.  PROBLEMS YOU SHOULD REPORT TO [place]: Fevers over 100.5 Fahrenheit. Heavy bleeding, or clots ( See above notes about blood in urine ). Inability to urinate. Drug reactions ( hives, rash, nausea, vomiting, diarrhea ). Severe burning or pain with urination that is not improving.  FOLLOW-UP: You will need a follow-up appointment to monitor your progress. Call for this appointment at the number listed above. Usually the first appointment will be about three to fourteen days after your surgery.   You will have a ureteral stent in place for about 6 months until next exchange.

## 2022-06-24 NOTE — Anesthesia Procedure Notes (Signed)
Procedure Name: LMA Insertion Date/Time: 06/24/2022 8:33 AM  Performed by: Claudia Desanctis, CRNAPre-anesthesia Checklist: Emergency Drugs available, Patient identified, Suction available and Patient being monitored Patient Re-evaluated:Patient Re-evaluated prior to induction Oxygen Delivery Method: Circle system utilized Preoxygenation: Pre-oxygenation with 100% oxygen Induction Type: IV induction Ventilation: Mask ventilation without difficulty LMA: LMA inserted LMA Size: 4.0 Number of attempts: 1 Placement Confirmation: positive ETCO2 and breath sounds checked- equal and bilateral Tube secured with: Tape Dental Injury: Teeth and Oropharynx as per pre-operative assessment

## 2022-06-24 NOTE — Op Note (Signed)
Operative Note  Preoperative diagnosis:  1.  Severe left hydronephrosis 2. Atrophic left kidney 3. Acute renal failure  Postoperative diagnosis: 1.  Severe left hydronephrosis 2. Atrophic left kidney 3. Acute renal failure  Procedure(s): 1.  Cystoscopy 2. Left retrograde pyelogram with interpretation 3. Left ureteral stent exchange 4. Fluoroscopy <1 hour with intraoperative interpretation  Surgeon: Rexene Alberts, MD  Assistants:  None  Anesthesia:  General  Complications:  None  EBL:  Minimal  Specimens: 1. None  Drains/Catheters: 1.  Left 6Fr x 26cm ureteral stent  Intraoperative findings:   Cystoscopy demonstrated no suspicious lesions, masses, stones or other pathology. Left retrograde pyelogram demonstrated severe left hydronephrosis. Tortuous left ureter. Successful left ureteral stent exchange with curl in the renal pelvis and bladder respectively.   Indication:  Albert Hahn is a 75 y.o. male with chronic severe left hydronephrosis history of renal failure here for left ureteral stent exchange. After reviewing the management options for treatment, he elected to proceed with the above surgical procedure(s). We have discussed the potential benefits and risks of the procedure, side effects of the proposed treatment, the likelihood of the patient achieving the goals of the procedure, and any potential problems that might occur during the procedure or recuperation. Informed consent has been obtained.   Description of procedure: The patient was taken to the operating room and general anesthesia was induced.  The patient was placed in the dorsal lithotomy position, prepped and draped in the usual sterile fashion, and preoperative antibiotics were administered. A preoperative time-out was performed.    Cystourethroscopy was performed.  The patient's urethra was examined and was normal. There was some bilobar prostatic hypertrophy. The bladder was then systematically  examined in its entirety. There was no evidence for any bladder tumors, stones, or other mucosal pathology. He had severe trabeculation.   Attention then turned to the left ureteral orifice. Stent was grasped with grasper and removed through meatus. A 0.038 zip wire was passed through the stent into the renal pelvis and an ended catheter was inserted and passed up to the level of the renal pelvis. Of note, he had a severely tortuous left ureter that deviated laterally.  Omnipaque contrast was injected through the ureteral catheter and a retrograde pyelogram was performed with findings as dictated above. The wire was then replaced and the open ended catheter was removed.    A 6Fr x 26cm ureteral stent was advance over the wire. The stent was positioned appropriately under fluoroscopic and cystoscopic guidance.  The wire was then removed with an adequate stent curl noted in the renal pelvis as well as in the bladder.   The bladder was then emptied and the procedure ended.  The patient appeared to tolerate the procedure well and without complications.  The patient was able to be awakened and transferred to the recovery unit in satisfactory condition.    Matt R. [facility]  Pager: [phone]

## 2022-06-24 NOTE — H&P (Signed)
Office Visit Report     06/07/2022   --------------------------------------------------------------------------------  --------------------------------------------------------------------------------   CC/HPI: Albert Hahn is a 76 year old male seen in followup today for chronic left UPJ obstruction.   He has a past medical history for Lewy body dementia, Parkinson's disease and hypertension.   #1. Left UPJ obstruction:  He was sent to the ED from his primary care office with reports of acute renal failure in 12/2021. Creatinine was 2.49 according to a EGFR of 26 from a baseline of 1.6 in 02/2021. CT A/P 12/30/2021 incidentally revealed chronic severe left-sided hydronephrosis with progressive renal cortical atrophy consistent with a chronic UPJ obstruction. There were no ureteral stones or masses identified. Of note, reviewing his prior imaging, there has been evidence of chronic hydronephrosis in the past. He does report a history of urolithiasis and in 2018 had a punctate right lower pole stone. He denies passing any stones over the past several years. There is no stones or any current imaging. He denies significant bothersome abdominal pain or flank pain.  -S/p left ureteral stent placement on 12/30/2021 with severe left hydronephrosis and tortuous left ureter.  -His renal function initially did not improve much with creatinine on 5/20 at 2.23 equating to a GFR of 30.  -Most recent creatinine was 1.8 in 01/2022 equating to GFR of 36.  -He has been tolerating his stent well. He denies fevers, chills, abdominal pain or flank pain.   #2. BPH/LUTS: At baseline, he does state that he is weak flow of stream with sensation incomplete bladder emptying and sometimes incontinence. Unclear if this is overflow incontinence. He previously saw Dr. Alyson Ingles in 2019 with BPH/LUTS and was on tamsulosin and finasteride. He also had a negative gross hematuria evaluation in 2019.  -He denies taking any medication for  the urinary tract symptoms at this time and reports no bother.   Patient currently denies fever, chills, sweats, nausea, vomiting, abdominal or flank pain, gross hematuria or dysuria.   06/07/2022: Patient with above-noted history, last seen on September 12. Here today for preoperative appointment prior to undergoing repeat cystoscopy with left retrograde pyelogram and stent exchange with Dr. Abner Greenspan on 11/10. Patient here today with 2 family members. Tolerating indwelling stent as expected but fairly well without any noted complication. He does continue with some underlying lower urinary tract symptoms including weak stream, mixed incontinence but this is grossly stable and consistent with prior baseline. Not overly bothersome per patient report. Patient currently denies fever, chills, sweats, nausea, vomiting, abdominal or flank pain, gross hematuria or dysuria. Correlated with family members, no changes in past medical history, prescription medications taken on a daily basis, no interval surgical or procedural intervention, no interval treatment for UTI or other infectious process.     ALLERGIES: Bee stings Codeine Derivatives Shellfish    MEDICATIONS: Hydrochlorothiazide 25 mg tablet 1 tablet PO Daily  Terazosin Hcl  Bupropion Hcl 75 mg tablet Oral  Diazepam 5 mg tablet Oral  Temazepam 30 mg capsule Oral     GU PSH: Cystoscopy - 2018       PSH Notes: Hemorrhoidectomy, Inguinal Hernia Repair, Back Surgery  Brain Shunts  Non Hodgkin lymphoma  hip replacement     NON-GU PSH: Hemorrhoidectomy (favorite) - 2008     GU PMH: BPH w/LUTS - 04/26/2022, - 01/13/2022 Hydronephrosis - 04/26/2022, - 01/13/2022 Urinary Frequency - 04/26/2022, - 01/13/2022 Incontinence w/o Sensation - 01/13/2022 BPH w/o LUTS - 2019 Gross hematuria - 2019, - 2018 Nocturia, Nocturia - 2014 Overactive bladder,  Overactive bladder - 2014 Urinary Urgency, Urinary urgency - 2014 Weak Urinary Stream, Weak urinary stream -  2014      PMH Notes:  2006-09-21 15:15:49 - Note: Penile Pain  1898-08-15 00:00:00 - Note: Normal Routine History And Physical Adult  2006-09-21 15:15:49 - Note: Delays In Starting Urination (Hesitancy)  B-cell lymphoma   NON-GU PMH: Personal history of other mental and behavioral disorders, History of depression - 2014 Psychogenic ED, Impotence, psychogenic - 2014 Anxiety Arthritis Depression GERD Hydrocephalus, unspecified Hypertension Parkinson's disease    FAMILY HISTORY: 2 daughters - Runs in Family Colon Cancer - Father Diabetes - Runs in [place] Status Number - Father Hypertension - Runs in Family   SOCIAL HISTORY: Marital Status: Married Ethnicity: Not Hispanic Or Latino; Race: White Current Smoking Status: Patient does not smoke anymore. Has not smoked since 01/14/1999. Smoked for 40 years.   Tobacco Use Assessment Completed: Used Tobacco in last 30 days? Does not drink anymore.  Drinks 3 caffeinated drinks per day. 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     Notes: Tobacco Use, Caffeine Use, Death In The Family Father, Death In The Family Mother   REVIEW OF SYSTEMS:    GU Review Male:   Patient denies frequent urination, hard to postpone urination, burning/ pain with urination, get up at night to urinate, leakage of urine, stream starts and stops, trouble starting your stream, have to strain to urinate , erection problems, and penile pain.  Gastrointestinal (Upper):   Patient denies nausea, vomiting, and indigestion/ heartburn.  Gastrointestinal (Lower):   Patient denies diarrhea and constipation.  Constitutional:   Patient denies fever, night sweats, weight loss, and fatigue.  Skin:   Patient denies skin rash/ lesion and itching.  Eyes:   Patient denies blurred vision and double vision.  Ears/ Nose/ Throat:   Patient denies sore throat and sinus problems.  Hematologic/Lymphatic:   Patient denies swollen glands and easy bruising.   Cardiovascular:   Patient denies leg swelling and chest pains.  Respiratory:   Patient denies shortness of breath and cough.  Endocrine:   Patient denies excessive thirst.  Musculoskeletal:   Patient denies back pain and joint pain.  Neurological:   Patient denies headaches and dizziness.  Psychologic:   Patient denies depression and anxiety.   VITAL SIGNS:      06/07/2022 11:49 AM  Weight 186 lb / 84.37 kg  Height 67 in / 170.18 cm  BP 166/89 mmHg  Pulse 83 /min  Temperature 97.3 F / 36.2 C  BMI 29.1 kg/m   MULTI-SYSTEM PHYSICAL EXAMINATION:    Constitutional: Well-nourished. No physical deformities. Normally developed. Good grooming. Ambulating with use of rolling walker.  Neck: Neck symmetrical, not swollen. Normal tracheal position.  Respiratory: No labored breathing, no use of accessory muscles.   Cardiovascular: Normal temperature, normal extremity pulses, no swelling, no varicosities.  Skin: No paleness, no jaundice, no cyanosis. No lesion, no ulcer, no rash.  Neurologic / Psychiatric: Stigmata of underlying cognitive dysfunction present, stable. Mentating at baseline today per family member report.  Gastrointestinal: No mass, no tenderness, no rigidity, non obese abdomen.  Musculoskeletal: Normal gait and station of head and neck.     Complexity of Data:  Source Of History:  Patient, Family/Caregiver, Medical Record Summary  Lab Test Review:   BMP, CBC with Diff, CMP  Records Review:   Previous Doctor Records, Previous Hospital Records, Previous Patient Records  Urine Test Review:   Urinalysis, Urine Culture  X-Ray Review: C.T.  Abdomen/Pelvis: Reviewed Films. Reviewed Report.     06/07/22  Urinalysis  Urine Appearance Clear   Urine Color Yellow   Urine Glucose Neg mg/dL  Urine Bilirubin Neg mg/dL  Urine Ketones Neg mg/dL  Urine Specific Gravity 1.030   Urine Blood 2+ ery/uL  Urine pH 5.5   Urine Protein 2+ mg/dL  Urine Urobilinogen 0.2 mg/dL  Urine Nitrites  Neg   Urine Leukocyte Esterase Neg leu/uL  Urine WBC/hpf 0 - 5/hpf   Urine RBC/hpf 3 - 10/hpf   Urine Epithelial Cells NS (Not Seen)   Urine Bacteria Rare (0-9/hpf)   Urine Mucous Not Present   Urine Yeast NS (Not Seen)   Urine Trichomonas Not Present   Urine Cystals NS (Not Seen)   Urine Casts Granular   Urine Sperm Not Present    PROCEDURES:          Urinalysis w/Scope - 81001 Dipstick Dipstick Cont'd Micro  Color: Yellow Bilirubin: Neg WBC/hpf: 0 - 5/hpf  Appearance: Clear Ketones: Neg RBC/hpf: 3 - 10/hpf  Specific Gravity: 1.030 Blood: 2+ Bacteria: Rare (0-9/hpf)  pH: 5.5 Protein: 2+ Cystals: NS (Not Seen)  Glucose: Neg Urobilinogen: 0.2 Casts: Granular    Nitrites: Neg Trichomonas: Not Present    Leukocyte Esterase: Neg Mucous: Not Present      Epithelial Cells: NS (Not Seen)      Yeast: NS (Not Seen)      Sperm: Not Present    Notes:      ASSESSMENT:      ICD-10 Details  1 GU:   Hydronephrosis - N13.0 Left, Chronic, Threat to Bodily Function  2   BPH w/LUTS - N40.1 Chronic, Stable  3   Urinary Frequency - R35.0 Chronic, Stable   PLAN:           Orders Labs Urine Culture          Schedule Return Visit/Planned Activity: Keep Scheduled Appointment - Follow up MD, Schedule Surgery          Document Letter(s):  Created for Patient: Clinical Summary         Notes:   All questions answered to the best of my ability regarding the upcoming procedure and expected postoperative course with understanding expressed by patient and family members present today. He is going to be assisted to the bathroom prior to check out in an attempt to obtain a voided urine specimen so that urine culture can be sent to serve as a baseline. He will proceed with previously scheduled cystoscopy, retrograde pyelogram and left ureteral stent exchange on 11/10.        Next Appointment:      Next Appointment: 06/24/2022 09:00 AM    Appointment Type: Surgery     Location: [facility] [phone]    Provider: Rexene Alberts, M.D.    Reason for Visit: [facility]/OP CYSTO LT [facility], LT [place]      Urology Preoperative H&P   Chief Complaint: Left UPJ obstruction  History of Present Illness: THOMES BURAK is a 76 y.o. male with left UPJ obstruction here for left ureteral stent exchange. Denies fevers, chills, dysuria.    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   PTSD (post-traumatic stress disorder)     Past Surgical History:  Procedure Laterality Date   BACK SURGERY     L3-L4 Fusion per pt   BRAIN SURGERY     COLONOSCOPY W/ POLYPECTOMY     CYSTOSCOPY W/ URETERAL STENT PLACEMENT Left 12/30/2021   Procedure: CYSTOSCOPY WITH RETROGRADE PYELOGRAM/URETERAL STENT PLACEMENT;  Surgeon: Janith Lima, MD;  Location: [facility];  Service: Urology;  Laterality: Left;   HERNIA REPAIR     JOINT REPLACEMENT     LUMBAR PUNCTURE  07/23/2012   severe headaches   NECK SURGERY     metal plates and screws in neck    TOTAL HIP ARTHROPLASTY Left    VENTRICULOPERITONEAL SHUNT N/A 11/25/2016   Procedure: SHUNT INSERTION VENTRICULAR-PERITONEAL;  Surgeon: Eustace Moore, MD;  Location: [place];  Service: Neurosurgery;  Laterality: N/A;    Allergies:  Allergies  Allergen Reactions   Bee Venom Anaphylaxis   Shellfish Allergy Anaphylaxis   Wasp Venom Anaphylaxis   Other Nausea And Vomiting    Unknown anesthetic spray - nausea/vomiting   Codeine Nausea And Vomiting    Family History  Problem Relation Age of Onset   Healthy Mother    Healthy Father     Social History:  reports that he has quit smoking. He has  never used smokeless tobacco. He reports that he does not drink alcohol and does not use drugs.  ROS: A complete review of systems was performed.  All systems are negative except for pertinent findings as noted.  Physical Exam:  Vital signs in last 24 hours: Temp:  [97.9 F (36.6 C)] 97.9 F (36.6 C) (11/10 0622) Pulse Rate:  [79] 79 (11/10 0622) Resp:  [20] 20 (11/10 0622) BP: (155)/(87) 155/87 (11/10 0622) SpO2:  [99 %] 99 % (11/10 0622) Weight:  [85.3 kg] 85.3 kg (11/10 0622) Constitutional:  Alert and oriented, No acute distress Cardiovascular: Regular rate and rhythm Respiratory: Normal respiratory effort, Lungs clear bilaterally GI: Abdomen is soft, nontender, nondistended, no abdominal masses GU: No CVA tenderness Lymphatic: No lymphadenopathy Neurologic: Grossly intact, no focal deficits Psychiatric: Normal mood and affect  Laboratory Data:  No results for input(s): "WBC", "HGB", "HCT", "PLT" in the last 72 hours.  No results for input(s): "NA", "K", "CL", "GLUCOSE", "BUN", "CALCIUM", "CREATININE" in the last 72 hours.  Invalid input(s): "CO3"   No results found for this or any previous visit (from the past 24 hour(s)). No results found for this or any previous visit (from the past 240 hour(s)).  Renal Function: No results for input(s): "CREATININE" in the last 168 hours. Estimated Creatinine Clearance: 28.6 mL/min (A) (by C-G formula based on SCr of 2.24 mg/dL (H)).  Radiologic Imaging: No results found.  I independently reviewed the above imaging studies.  Assessment and Plan Tajee Savant Avey is a 76 y.o. male with left UPJ obstruction here for left ureteral stent exchange.  -The risks, benefits and alternatives of cystoscopy with  JJ stent exchange was discussed with the patient.  Risks include, but are not limited to: bleeding, urinary tract infection, ureteral injury, ureteral stricture disease, chronic pain, urinary symptoms, bladder injury, stent  migration, the need for nephrostomy tube placement, MI, CVA, DVT, PE and the inherent risks with general anesthesia.  The patient voices understanding and wishes to proceed.  Matt R. Thuan Tippett MD 06/24/2022, 6:56 AM  [facility] Pager: [phone]): [phone]

## 2022-06-25 ENCOUNTER — Encounter (HOSPITAL_COMMUNITY): Payer: Self-pay | Admitting: Urology

## 2022-06-26 DIAGNOSIS — I10 Essential (primary) hypertension: Secondary | ICD-10-CM | POA: Diagnosis not present

## 2022-06-26 DIAGNOSIS — R0689 Other abnormalities of breathing: Secondary | ICD-10-CM | POA: Diagnosis not present

## 2022-06-26 DIAGNOSIS — R509 Fever, unspecified: Secondary | ICD-10-CM | POA: Diagnosis not present

## 2022-06-26 DIAGNOSIS — R52 Pain, unspecified: Secondary | ICD-10-CM | POA: Diagnosis not present

## 2022-06-27 ENCOUNTER — Emergency Department (HOSPITAL_COMMUNITY): Payer: No Typology Code available for payment source

## 2022-06-27 ENCOUNTER — Inpatient Hospital Stay (HOSPITAL_COMMUNITY)
Admission: EM | Admit: 2022-06-27 | Discharge: 2022-07-01 | DRG: 698 | Disposition: A | Discharge status: Home Health | Payer: No Typology Code available for payment source | Attending: Internal Medicine | Admitting: Internal Medicine

## 2022-06-27 ENCOUNTER — Encounter (HOSPITAL_COMMUNITY): Payer: Self-pay

## 2022-06-27 ENCOUNTER — Other Ambulatory Visit: Payer: Self-pay

## 2022-06-27 DIAGNOSIS — G9341 Metabolic encephalopathy: Secondary | ICD-10-CM | POA: Diagnosis not present

## 2022-06-27 DIAGNOSIS — T83592A Infection and inflammatory reaction due to indwelling ureteral stent, initial encounter: Secondary | ICD-10-CM | POA: Diagnosis not present

## 2022-06-27 DIAGNOSIS — E876 Hypokalemia: Secondary | ICD-10-CM | POA: Diagnosis not present

## 2022-06-27 DIAGNOSIS — N1330 Unspecified hydronephrosis: Secondary | ICD-10-CM

## 2022-06-27 DIAGNOSIS — Z8572 Personal history of non-Hodgkin lymphomas: Secondary | ICD-10-CM

## 2022-06-27 DIAGNOSIS — G20A1 Parkinson's disease without dyskinesia, without mention of fluctuations: Secondary | ICD-10-CM

## 2022-06-27 DIAGNOSIS — N184 Chronic kidney disease, stage 4 (severe): Secondary | ICD-10-CM

## 2022-06-27 DIAGNOSIS — Z923 Personal history of irradiation: Secondary | ICD-10-CM

## 2022-06-27 DIAGNOSIS — N179 Acute kidney failure, unspecified: Secondary | ICD-10-CM | POA: Diagnosis present

## 2022-06-27 DIAGNOSIS — E440 Moderate protein-calorie malnutrition: Secondary | ICD-10-CM | POA: Insufficient documentation

## 2022-06-27 DIAGNOSIS — I129 Hypertensive chronic kidney disease with stage 1 through stage 4 chronic kidney disease, or unspecified chronic kidney disease: Secondary | ICD-10-CM | POA: Diagnosis present

## 2022-06-27 DIAGNOSIS — F05 Delirium due to known physiological condition: Secondary | ICD-10-CM | POA: Diagnosis present

## 2022-06-27 DIAGNOSIS — J439 Emphysema, unspecified: Secondary | ICD-10-CM | POA: Diagnosis present

## 2022-06-27 DIAGNOSIS — N390 Urinary tract infection, site not specified: Secondary | ICD-10-CM | POA: Diagnosis not present

## 2022-06-27 DIAGNOSIS — Y732 Prosthetic and other implants, materials and accessory gastroenterology and urology devices associated with adverse incidents: Secondary | ICD-10-CM | POA: Diagnosis present

## 2022-06-27 DIAGNOSIS — B965 Pseudomonas (aeruginosa) (mallei) (pseudomallei) as the cause of diseases classified elsewhere: Secondary | ICD-10-CM | POA: Diagnosis present

## 2022-06-27 DIAGNOSIS — Z91013 Allergy to seafood: Secondary | ICD-10-CM

## 2022-06-27 DIAGNOSIS — Z79899 Other long term (current) drug therapy: Secondary | ICD-10-CM

## 2022-06-27 DIAGNOSIS — Z888 Allergy status to other drugs, medicaments and biological substances status: Secondary | ICD-10-CM

## 2022-06-27 DIAGNOSIS — G43909 Migraine, unspecified, not intractable, without status migrainosus: Secondary | ICD-10-CM | POA: Diagnosis present

## 2022-06-27 DIAGNOSIS — Z87891 Personal history of nicotine dependence: Secondary | ICD-10-CM

## 2022-06-27 DIAGNOSIS — R319 Hematuria, unspecified: Secondary | ICD-10-CM

## 2022-06-27 DIAGNOSIS — Z981 Arthrodesis status: Secondary | ICD-10-CM

## 2022-06-27 DIAGNOSIS — Z885 Allergy status to narcotic agent status: Secondary | ICD-10-CM

## 2022-06-27 DIAGNOSIS — M109 Gout, unspecified: Secondary | ICD-10-CM | POA: Diagnosis present

## 2022-06-27 DIAGNOSIS — Z6830 Body mass index (BMI) 30.0-30.9, adult: Secondary | ICD-10-CM

## 2022-06-27 DIAGNOSIS — R4781 Slurred speech: Secondary | ICD-10-CM | POA: Diagnosis present

## 2022-06-27 DIAGNOSIS — G901 Familial dysautonomia [Riley-Day]: Secondary | ICD-10-CM | POA: Diagnosis present

## 2022-06-27 DIAGNOSIS — F32A Depression, unspecified: Secondary | ICD-10-CM | POA: Diagnosis present

## 2022-06-27 DIAGNOSIS — R269 Unspecified abnormalities of gait and mobility: Secondary | ICD-10-CM | POA: Diagnosis present

## 2022-06-27 DIAGNOSIS — F4310 Post-traumatic stress disorder, unspecified: Secondary | ICD-10-CM | POA: Diagnosis present

## 2022-06-27 DIAGNOSIS — Z79890 Hormone replacement therapy (postmenopausal): Secondary | ICD-10-CM

## 2022-06-27 DIAGNOSIS — G3183 Dementia with Lewy bodies: Secondary | ICD-10-CM | POA: Diagnosis present

## 2022-06-27 DIAGNOSIS — J441 Chronic obstructive pulmonary disease with (acute) exacerbation: Secondary | ICD-10-CM

## 2022-06-27 DIAGNOSIS — Y838 Other surgical procedures as the cause of abnormal reaction of the patient, or of later complication, without mention of misadventure at the time of the procedure: Secondary | ICD-10-CM | POA: Diagnosis present

## 2022-06-27 DIAGNOSIS — Z91030 Bee allergy status: Secondary | ICD-10-CM

## 2022-06-27 DIAGNOSIS — Z1152 Encounter for screening for COVID-19: Secondary | ICD-10-CM

## 2022-06-27 DIAGNOSIS — Z96642 Presence of left artificial hip joint: Secondary | ICD-10-CM | POA: Diagnosis present

## 2022-06-27 DIAGNOSIS — I951 Orthostatic hypotension: Secondary | ICD-10-CM | POA: Diagnosis present

## 2022-06-27 DIAGNOSIS — A419 Sepsis, unspecified organism: Secondary | ICD-10-CM

## 2022-06-27 DIAGNOSIS — N136 Pyonephrosis: Secondary | ICD-10-CM | POA: Diagnosis present

## 2022-06-27 DIAGNOSIS — Z9221 Personal history of antineoplastic chemotherapy: Secondary | ICD-10-CM

## 2022-06-27 DIAGNOSIS — F419 Anxiety disorder, unspecified: Secondary | ICD-10-CM | POA: Diagnosis present

## 2022-06-27 DIAGNOSIS — F0280 Dementia in other diseases classified elsewhere without behavioral disturbance: Secondary | ICD-10-CM | POA: Diagnosis present

## 2022-06-27 DIAGNOSIS — Z982 Presence of cerebrospinal fluid drainage device: Secondary | ICD-10-CM

## 2022-06-27 LAB — URINALYSIS, ROUTINE W REFLEX MICROSCOPIC
Bilirubin Urine: NEGATIVE
Glucose, UA: NEGATIVE mg/dL
Ketones, ur: 5 mg/dL — AB
Nitrite: NEGATIVE
Protein, ur: >=300 mg/dL — AB
RBC / HPF: >50 RBC/hpf — ABNORMAL HIGH (ref 0–5)
Specific Gravity, Urine: 1.016 (ref 1.005–1.030)
WBC, UA: >50 WBC/hpf — ABNORMAL HIGH (ref 0–5)
pH: 5 (ref 5.0–8.0)

## 2022-06-27 LAB — RESP PANEL BY RT-PCR (FLU A&B, COVID) ARPGX2
Influenza A by PCR: NEGATIVE
Influenza B by PCR: NEGATIVE
SARS Coronavirus 2 by RT PCR: NEGATIVE

## 2022-06-27 LAB — LACTIC ACID, PLASMA
Lactic Acid, Venous: 1.8 mmol/L (ref 0.5–1.9)
Lactic Acid, Venous: 1.9 mmol/L (ref 0.5–1.9)

## 2022-06-27 LAB — COMPREHENSIVE METABOLIC PANEL
ALT: <5 U/L (ref 0–44)
AST: 17 U/L (ref 15–41)
Albumin: 3.8 g/dL (ref 3.5–5.0)
Alkaline Phosphatase: 91 U/L (ref 38–126)
Anion gap: 14 (ref 5–15)
BUN: 55 mg/dL — ABNORMAL HIGH (ref 8–23)
CO2: 21 mmol/L — ABNORMAL LOW (ref 22–32)
Calcium: 9.1 mg/dL (ref 8.9–10.3)
Chloride: 103 mmol/L (ref 98–111)
Creatinine, Ser: 2.62 mg/dL — ABNORMAL HIGH (ref 0.61–1.24)
GFR, Estimated: 25 mL/min — ABNORMAL LOW (ref >60)
Glucose, Bld: 113 mg/dL — ABNORMAL HIGH (ref 70–99)
Potassium: 2.8 mmol/L — ABNORMAL LOW (ref 3.5–5.1)
Sodium: 138 mmol/L (ref 135–145)
Total Bilirubin: 0.8 mg/dL (ref 0.3–1.2)
Total Protein: 7.5 g/dL (ref 6.5–8.1)

## 2022-06-27 LAB — CBC WITH DIFFERENTIAL/PLATELET
Abs Immature Granulocytes: 0.49 10*3/uL — ABNORMAL HIGH (ref 0.00–0.07)
Basophils Absolute: 0.1 10*3/uL (ref 0.0–0.1)
Basophils Relative: 0 %
Eosinophils Absolute: 0 10*3/uL (ref 0.0–0.5)
Eosinophils Relative: 0 %
HCT: 42 % (ref 39.0–52.0)
Hemoglobin: 14.1 g/dL (ref 13.0–17.0)
Immature Granulocytes: 3 %
Lymphocytes Relative: 6 %
Lymphs Abs: 1.1 10*3/uL (ref 0.7–4.0)
MCH: 31.5 pg (ref 26.0–34.0)
MCHC: 33.6 g/dL (ref 30.0–36.0)
MCV: 94 fL (ref 80.0–100.0)
Monocytes Absolute: 1.3 10*3/uL — ABNORMAL HIGH (ref 0.1–1.0)
Monocytes Relative: 7 %
Neutro Abs: 15.4 10*3/uL — ABNORMAL HIGH (ref 1.7–7.7)
Neutrophils Relative %: 84 %
Platelets: 199 10*3/uL (ref 150–400)
RBC: 4.47 MIL/uL (ref 4.22–5.81)
RDW: 13.2 % (ref 11.5–15.5)
WBC: 18.4 10*3/uL — ABNORMAL HIGH (ref 4.0–10.5)
nRBC: 0 % (ref 0.0–0.2)

## 2022-06-27 LAB — CBG MONITORING, ED: Glucose-Capillary: 112 mg/dL — ABNORMAL HIGH (ref 70–99)

## 2022-06-27 LAB — MAGNESIUM: Magnesium: 2 mg/dL (ref 1.7–2.4)

## 2022-06-27 MED ORDER — LACTATED RINGERS IV SOLN: INTRAVENOUS | Status: DC

## 2022-06-27 MED ORDER — ENOXAPARIN SODIUM 30 MG/0.3ML IJ SOSY
30.0000 mg | PREFILLED_SYRINGE | INTRAMUSCULAR | Status: DC
  Administered 2022-06-28 – 2022-06-30 (×3): 30 mg via SUBCUTANEOUS
  Filled 2022-06-27 (×3): qty 0.3

## 2022-06-27 MED ORDER — ONDANSETRON HCL 4 MG PO TABS: 4.0000 mg | ORAL_TABLET | Freq: Four times a day (QID) | ORAL | Status: DC | PRN

## 2022-06-27 MED ORDER — FLUTICASONE FUROATE-VILANTEROL 100-25 MCG/ACT IN AEPB
1.0000 | INHALATION_SPRAY | Freq: Every day | RESPIRATORY_TRACT | Status: DC
  Administered 2022-06-29 – 2022-07-01 (×3): 1 via RESPIRATORY_TRACT
  Filled 2022-06-27: qty 28

## 2022-06-27 MED ORDER — SODIUM CHLORIDE 0.9 % IV SOLN
2.0000 g | INTRAVENOUS | Status: DC
  Administered 2022-06-28 – 2022-06-29 (×2): 2 g via INTRAVENOUS
  Filled 2022-06-27 (×2): qty 12.5

## 2022-06-27 MED ORDER — POTASSIUM CHLORIDE 20 MEQ PO PACK
60.0000 meq | PACK | ORAL | Status: AC
  Administered 2022-06-27: 60 meq via ORAL
  Filled 2022-06-27: qty 3

## 2022-06-27 MED ORDER — ACETAMINOPHEN 650 MG RE SUPP: 650.0000 mg | Freq: Four times a day (QID) | RECTAL | Status: DC | PRN

## 2022-06-27 MED ORDER — POTASSIUM CHLORIDE 10 MEQ/100ML IV SOLN
10.0000 meq | INTRAVENOUS | Status: AC
  Administered 2022-06-27 (×2): 10 meq via INTRAVENOUS
  Filled 2022-06-27 (×2): qty 100

## 2022-06-27 MED ORDER — ALBUTEROL SULFATE (2.5 MG/3ML) 0.083% IN NEBU
2.5000 mg | INHALATION_SOLUTION | RESPIRATORY_TRACT | Status: DC | PRN
  Administered 2022-06-28 – 2022-06-30 (×3): 2.5 mg via RESPIRATORY_TRACT
  Filled 2022-06-27 (×3): qty 3

## 2022-06-27 MED ORDER — LACTATED RINGERS IV BOLUS (SEPSIS): 1000.0000 mL | Freq: Once | INTRAVENOUS | Status: DC

## 2022-06-27 MED ORDER — ACETAMINOPHEN 325 MG PO TABS
650.0000 mg | ORAL_TABLET | Freq: Four times a day (QID) | ORAL | Status: DC | PRN
  Administered 2022-06-28 – 2022-06-30 (×2): 650 mg via ORAL
  Filled 2022-06-27 (×3): qty 2

## 2022-06-27 MED ORDER — SODIUM CHLORIDE 0.9 % IV BOLUS (SEPSIS)
1000.0000 mL | Freq: Once | INTRAVENOUS | Status: AC
  Administered 2022-06-27: 1000 mL via INTRAVENOUS

## 2022-06-27 MED ORDER — UMECLIDINIUM BROMIDE 62.5 MCG/ACT IN AEPB
1.0000 | INHALATION_SPRAY | Freq: Every day | RESPIRATORY_TRACT | Status: DC
  Administered 2022-06-29 – 2022-07-01 (×3): 1 via RESPIRATORY_TRACT
  Filled 2022-06-27: qty 7

## 2022-06-27 MED ORDER — SODIUM CHLORIDE 0.9 % IV SOLN
2.0000 g | Freq: Once | INTRAVENOUS | Status: AC
  Administered 2022-06-27: 2 g via INTRAVENOUS
  Filled 2022-06-27: qty 12.5

## 2022-06-27 MED ORDER — ONDANSETRON HCL 4 MG/2ML IJ SOLN: 4.0000 mg | Freq: Four times a day (QID) | INTRAMUSCULAR | Status: DC | PRN

## 2022-06-27 NOTE — H&P (Signed)
History and Physical    Patient: Albert Hahn Abdo YTK:[id number] DOB: January 26, 1946 DOA: 06/27/2022 DOS: the patient was seen and examined on 06/27/2022 PCP: Asencion Noble, MD  Patient coming from: Home  Chief Complaint:  Chief Complaint  Patient presents with   Fatigue   Fever   HPI: [name] is a 76 y.o. male with medical history significant of PD, HTN.  L sided hydronephrosis, s/p ureteral stent.  CKD 4.  Pt with ureteral stent change out x3 days ago by Dr. Abner Greenspan.  Pt now in to ED with c/o low-grade fever and severe fatigue.  Had been on Augmentin after procedure.  Slurring words for past few hours.  Tm 100.9 this AM, pt in to ED.    Review of Systems: As mentioned in the history of present illness. All other systems reviewed and are negative. 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   PTSD (post-traumatic stress disorder)    Past Surgical History:  Procedure Laterality Date   BACK SURGERY     L3-L4 Fusion per pt   BRAIN SURGERY     COLONOSCOPY W/ POLYPECTOMY     CYSTOSCOPY W/ URETERAL STENT PLACEMENT Left 12/30/2021   Procedure: CYSTOSCOPY WITH RETROGRADE PYELOGRAM/URETERAL STENT PLACEMENT;  Surgeon: Janith Lima, MD;  Location: [facility];  Service: Urology;  Laterality: Left;   CYSTOSCOPY W/ URETERAL STENT PLACEMENT Left 06/24/2022   Procedure: CYSTOSCOPY WITH RETROGRADE PYELOGRAM/URETERAL STENT EXCHANGE;  Surgeon: Janith Lima, MD;  Location: [facility];  Service: Urology;  Laterality: Left;  ONLY NEEDS 30 MIN   HERNIA REPAIR     JOINT REPLACEMENT     LUMBAR PUNCTURE  07/23/2012   severe headaches   NECK  SURGERY     metal plates and screws in neck    TOTAL HIP ARTHROPLASTY Left    VENTRICULOPERITONEAL SHUNT N/A 11/25/2016   Procedure: SHUNT INSERTION VENTRICULAR-PERITONEAL;  Surgeon: Eustace Moore, MD;  Location: [facility];  Service: Neurosurgery;  Laterality: N/A;   Social History:  reports that he has quit smoking. He has never used smokeless tobacco. He reports that he does not drink alcohol and does not use drugs.  Allergies  Allergen Reactions   Bee Venom Anaphylaxis   Shellfish Allergy Anaphylaxis   Wasp Venom Anaphylaxis   Lisinopril Cough   Other Nausea And Vomiting and Other (See Comments)    Unknown anesthetic spray - nausea/vomiting   Codeine Nausea And Vomiting    Family History  Problem Relation Age of Onset   Healthy Mother    Healthy Father     Prior to Admission medications   Medication Sig Start Date End Date Taking? Authorizing Provider  acetaminophen (TYLENOL) 500 MG tablet Take 500-1,000 mg by mouth every 6 (six) hours as needed (for migraines or mild pain).   Yes [provider]  allopurinol (ZYLOPRIM) 100 MG tablet Take 100 mg by mouth daily. 08/11/21  Yes [provider]  amitriptyline (ELAVIL) 25 MG tablet Take 25 mg by mouth at bedtime.   Yes  [provider]  amLODipine (NORVASC) 10 MG tablet Take 10 mg by mouth daily.   Yes [provider]  carbidopa-levodopa (SINEMET IR) 25-100 MG tablet Take 2 tablets by mouth See admin instructions. Take 2 tablets by mouth at 9:30 AM, 4 PM, and 9:30 PM 03/13/21  Yes [provider]  Cholecalciferol (VITAMIN D3) 50 MCG (2000 UT) TABS Take 4,000 Units by mouth daily.   Yes [provider]  docusate sodium (COLACE) 100 MG capsule Take 100 mg by mouth daily as needed for mild constipation.   Yes [provider]  Melatonin 3 MG CAPS Take 6 mg by mouth at bedtime. 04/30/21  Yes [provider]  QUEtiapine (SEROQUEL) 25 MG tablet Take 37.5-50 mg by mouth See  admin instructions. Take 50 mg by mouth at 3 PM and 37.5 mg at bedtime 04/30/21  Yes [provider]  topiramate (TOPAMAX) 100 MG tablet Take 100 mg by mouth at bedtime.   Yes [provider]  amitriptyline (ELAVIL) 50 MG tablet Take 1.5 tablets (75 mg total) by mouth at bedtime. Patient not taking: Reported on 06/27/2022 10/31/19   Murlean Iba, MD  hydrochlorothiazide (HYDRODIURIL) 25 MG tablet Take 0.5 tablets (12.5 mg total) by mouth daily. Patient not taking: Reported on 06/27/2022 10/31/19   Murlean Iba, MD  Pimavanserin Tartrate 34 MG CAPS Take 34 mg by mouth daily. Patient not taking: Reported on 06/27/2022    [provider]  potassium chloride SA (KLOR-CON M) 20 MEQ tablet Take 40 mEq by mouth daily. Patient not taking: Reported on 06/27/2022 01/29/21   [provider]    Physical Exam: Vitals:   06/27/22 2000 06/27/22 2100 06/27/22 2208 06/27/22 2230  BP: (!) 156/91 (!) 153/80  137/72  Pulse: 94 92  88  Resp: (!) 27 20  (!) 23  Temp:   98.8 F (37.1 C)   TempSrc:   Oral   SpO2: 97% 98%  98%   Constitutional: Lethargic Eyes: PERRL, lids and conjunctivae normal ENMT: Mucous membranes are dry. Posterior pharynx clear of any exudate or lesions.Normal dentition.  Neck: normal, supple, no masses, no thyromegaly Respiratory: Wheezing throughout both lungs. Cardiovascular: Regular rate and rhythm, no murmurs / rubs / gallops. No extremity edema. 2+ pedal pulses. No carotid bruits.  Abdomen: no tenderness, no masses palpated. No hepatosplenomegaly. Bowel sounds positive.  Musculoskeletal: no clubbing / cyanosis. No joint deformity upper and lower extremities. Good ROM, no contractures. Normal muscle tone.  Skin: no rashes, lesions, ulcers. No induration Neurologic: Slurred speech, 4/5 strength throughout Psychiatric: lethargic.  Data Reviewed:       Latest Ref Rng & Units 06/27/2022    5:45 PM 06/15/2022    1:25 PM 12/31/2021     7:30 AM  CBC  WBC 4.0 - 10.5 K/uL 18.4  8.0  5.5   Hemoglobin 13.0 - 17.0 g/dL 14.1  15.0  12.8   Hematocrit 39.0 - 52.0 % 42.0  44.6  37.7   Platelets 150 - 400 K/uL 199  285  199       Latest Ref Rng & Units 06/27/2022    5:45 PM 06/15/2022    1:25 PM 01/01/2022    9:56 AM  CMP  Glucose 70 - 99 mg/dL 113  69  94   BUN 8 - 23 mg/dL 55  45  46   Creatinine 0.61 - 1.24 mg/dL 2.62  2.24  2.23   Sodium 135 - 145 mmol/L 138  141  141   Potassium 3.5 - 5.1 mmol/L 2.8  3.9  3.6   Chloride 98 - 111 mmol/L 103  106  112   CO2 22 - 32 mmol/L '21  25  22   '$ Calcium 8.9 - 10.3 mg/dL 9.1  9.9  8.9   Total Protein 6.5 - 8.1 g/dL 7.5     Total Bilirubin 0.3 - 1.2 mg/dL 0.8     Alkaline Phos 38 - 126 U/L 91     AST 15 - 41 U/L 17     ALT 0 - 44 U/L <5      Urinalysis    Component Value Date/Time   COLORURINE YELLOW 06/27/2022 2051   APPEARANCEUR CLOUDY (A) 06/27/2022 2051   LABSPEC 1.016 06/27/2022 2051   LABSPEC 1.025 09/06/2006 1104   PHURINE 5.0 06/27/2022 2051   GLUCOSEU NEGATIVE 06/27/2022 2051   HGBUR LARGE (A) 06/27/2022 2051   BILIRUBINUR NEGATIVE 06/27/2022 2051   BILIRUBINUR Negative 09/06/2006 1104   KETONESUR 5 (A) 06/27/2022 2051   PROTEINUR >=300 (A) 06/27/2022 2051   UROBILINOGEN 1.0 08/20/2008 1340   NITRITE NEGATIVE 06/27/2022 2051   LEUKOCYTESUR LARGE (A) 06/27/2022 2051   LEUKOCYTESUR Trace 09/06/2006 1104    CT AP: IMPRESSION: 1. Interim placement of left-sided ureteral stent. Residual moderate to severe left-sided hydronephrosis but decreased compared to prior CT. Mild left renal atrophy. 2. Shunt catheter tubing with tip in the right lower quadrant. Small volume fluid in the right lower quadrant and pelvis likely related to shunt catheter. 3. Aortic atherosclerosis.  CT head: IMPRESSION: 1. Stable head CT, no acute intracranial process.  Assessment and Plan: * Sepsis secondary to UTI ([place]) Pt with UTI, recent ureteral stent placement just a couple of  days ago. Normal lactate, tachycardia resolved after IVF in ED. Cefepime Repeat CBC/BMP in AM UCx pending BCx pending Tylenol PRN for fever Tele monitor IVF  COPD with acute exacerbation ([place]) Pt also having onset of wheezing and SOB since arrival to ED earlier this evening. H/o COPD per family at bedside. CXR neg COPD pathway LABA, LAMA, and INH steroid PRN SABA Will hold off on systemic steroids for the moment Adult wheeze protocol  Acute metabolic encephalopathy Delirium secondary to UTI with sepsis.  Hydronephrosis of left kidney Chronic hydronephrosis of L kidney Got stent placed to see if any improvement in renal fxn. Unfortunately not really much improvement in renal fxn. Just had stent changed out x3 days ago.  CKD (chronic kidney disease) stage 4, GFR 15-29 ml/min (HCC) Creat 2.6 today, appears fairly close to baseline.  Parkinson's disease With dementia. Cont home sinemet      Advance Care Planning:   Code Status: Full Code  Consults: None  Family Communication: Family at bedside  Severity of Illness: The appropriate patient status for this patient is OBSERVATION. Observation status is judged to be reasonable and necessary in order to provide the required intensity of service to ensure the patient's safety. The patient's presenting symptoms, physical exam findings, and initial radiographic and laboratory data in the context of their medical condition is felt to place them at decreased risk for further clinical deterioration. Furthermore, it is anticipated that the patient will be medically stable for discharge from the hospital within 2 midnights of admission.   Author: Etta Quill., DO 06/27/2022 11:10 PM  For on call review [email or web address].

## 2022-06-27 NOTE — ED Provider Triage Note (Signed)
Emergency Medicine Provider Triage Evaluation Note  Albert Hahn , a 76 y.o. male  was evaluated in triage.  Pt complains of lethargic.  Patient had surgery 3 days ago by Dr. Abner Greenspan for hydronephrosis with ureteral pelvic junction obstruction.  He did fine after the procedure until yesterday when he started to have altered mental status, increased lethargy and weakness..  Patient's wife called his doctor who thinks that patient might have a UTI so patient has taken antibiotics since yesterday.  No chest pain, shortness of breath, fever.  Review of Systems  Positive: As above Negative: As above  Physical Exam  BP (!) 147/98   Pulse (!) 105   Temp 97.9 F (36.6 C) (Oral)   Resp 20   SpO2 99%  Gen:   Awake, no distress   Resp:  Normal effort  MSK:   Moves extremities without difficulty  Other:    Medical Decision Making  Medically screening exam initiated at 5:27 PM.  Appropriate orders placed.  Albert Hahn was informed that the remainder of the evaluation will be completed by another provider, this initial triage assessment does not replace that evaluation, and the importance of remaining in the ED until their evaluation is complete.

## 2022-06-27 NOTE — Assessment & Plan Note (Signed)
Delirium secondary to UTI with sepsis.

## 2022-06-27 NOTE — ED Provider Notes (Signed)
[facility] DEPT Provider Note   CSN: [id number] Arrival date & time: 06/27/22  1655     History  Chief Complaint  Patient presents with   Fatigue   Fever    Albert Hahn is a 76 y.o. male, hx of Parkinsons and Lewy Body dementia, who presents 2/2 to weakness and fatigue for the last day.  Wife states that he had a ureteral stent placed about 4 days ago, and was doing well, until on Sunday morning he developed a low-grade fever and severe fatigue.  Wife states she called an ambulance and was told the patient was okay, but his weakness persisted so they called the urologist Dr. Abner Greenspan on Sunday afternoon and was placed on Augmentin.  States he has taken 3 doses, has been persistently sleepy, fatigue, now has slurring of his words for the last few hours.  Had a fever of 100.9, this a.m. so they came into the ER.    Home Medications Prior to Admission medications   Medication Sig Start Date End Date Taking? Authorizing Provider  acetaminophen (TYLENOL) 500 MG tablet Take 500-1,000 mg by mouth every 6 (six) hours as needed for mild pain or moderate pain.    [provider]  allopurinol (ZYLOPRIM) 100 MG tablet Take 100 mg by mouth daily. 08/11/21   [provider]  amitriptyline (ELAVIL) 50 MG tablet Take 1.5 tablets (75 mg total) by mouth at bedtime. Patient taking differently: Take 50 mg by mouth at bedtime. 10/31/19   Johnson, Clanford L, MD  amLODipine (NORVASC) 10 MG tablet Take 10 mg by mouth daily.    [provider]  carbidopa-levodopa (SINEMET IR) 25-100 MG tablet Take 2 tablets by mouth 3 (three) times daily. 03/13/21   [provider]  Cholecalciferol (VITAMIN D-3 PO) Take 2 capsules by mouth daily.    [provider]  DOCUSATE SODIUM PO Take 1 capsule by mouth every other day.    [provider]  hydrochlorothiazide (HYDRODIURIL) 25 MG tablet Take 0.5 tablets (12.5 mg total) by mouth  daily. Patient taking differently: Take 25 mg by mouth daily. 10/31/19   Johnson, Clanford L, MD  Melatonin 3 MG CAPS Take 6 mg by mouth at bedtime. 04/30/21   [provider]  potassium chloride SA (KLOR-CON M) 20 MEQ tablet Take 40 mEq by mouth daily. 01/29/21   [provider]  QUEtiapine (SEROQUEL) 25 MG tablet Take 50 mg by mouth at bedtime. 04/30/21   [provider]  topiramate (TOPAMAX) 100 MG tablet Take 100 mg by mouth daily. At bedtime    [provider]      Allergies    Bee venom, Shellfish allergy, Wasp venom, Other, and Codeine    Review of Systems   Review of Systems  Constitutional:  Positive for fever.    Physical Exam Updated Vital Signs BP (!) 153/80   Pulse 92   Temp 97.9 F (36.6 C) (Oral)   Resp 20   SpO2 98%  Physical Exam Constitutional:      Appearance: He is ill-appearing.  HENT:     Head: Normocephalic.     Mouth/Throat:     Mouth: Mucous membranes are dry.  Eyes:     General: No visual field deficit.    Extraocular Movements: Extraocular movements intact.     Pupils: Pupils are equal, round, and reactive to light.  Neurological:     Mental Status: He is lethargic.     Cranial Nerves:  Cranial nerves 2-12 are intact. No facial asymmetry.     Motor: Motor function is intact.     Comments: +slurred speech, 4/5 strength of BUE and BLE     ED Results / Procedures / Treatments   Labs (all labs ordered are listed, but only abnormal results are displayed) Labs Reviewed  CBC WITH DIFFERENTIAL/PLATELET - Abnormal; Notable for the following components:      Result Value   WBC 18.4 (*)    Neutro Abs 15.4 (*)    Monocytes Absolute 1.3 (*)    Abs Immature Granulocytes 0.49 (*)    All other components within normal limits  COMPREHENSIVE METABOLIC PANEL - Abnormal; Notable for the following components:   Potassium 2.8 (*)    CO2 21 (*)    Glucose, Bld 113 (*)    BUN 55 (*)    Creatinine, Ser 2.62 (*)    GFR,  Estimated 25 (*)    All other components within normal limits  URINALYSIS, ROUTINE W REFLEX MICROSCOPIC - Abnormal; Notable for the following components:   APPearance CLOUDY (*)    Hgb urine dipstick LARGE (*)    Ketones, ur 5 (*)    Protein, ur >=300 (*)    Leukocytes,Ua LARGE (*)    RBC / HPF >50 (*)    WBC, UA >50 (*)    Bacteria, UA MANY (*)    All other components within normal limits  CBG MONITORING, ED - Abnormal; Notable for the following components:   Glucose-Capillary 112 (*)    All other components within normal limits  CULTURE, BLOOD (ROUTINE X 2)  CULTURE, BLOOD (ROUTINE X 2)  URINE CULTURE  RESP PANEL BY RT-PCR (FLU A&B, COVID) ARPGX2  LACTIC ACID, PLASMA  LACTIC ACID, PLASMA  MAGNESIUM    EKG EKG Interpretation  Date/Time:  Monday June 27 2022 17:41:09 EST Ventricular Rate:  106 PR Interval:  223 QRS Duration: 84 QT Interval:  335 QTC Calculation: 445 R Axis:   -17 Text Interpretation: Age not entered, assumed to be  76 years old for purpose of ECG interpretation Sinus tachycardia Prolonged PR interval Probable left atrial enlargement Borderline left axis deviation Abnormal R-wave progression, early transition Nonspecific repol abnormality, diffuse leads Confirmed by Margaretmary Eddy [phone]) on 06/27/2022 6:54:37 PM  Radiology DG Chest Port 1 View  Result Date: 06/27/2022 CLINICAL DATA:  Confusion.  Surgery 3 days ago. EXAM: PORTABLE CHEST 1 VIEW COMPARISON:  AP chest 10/28/2019, CT chest 10/22/2019 FINDINGS: Cardiac silhouette is again mildly to moderately enlarged. Mediastinal contours are unchanged with mildly tortuous thoracic aorta as seen on prior CT. Note is made the ascending aorta measures up to 4.1 cm in caliber on prior CT. Mildly decreased lung volumes. The lungs appear clear. No pleural effusion pneumothorax. Moderate multilevel degenerative disc changes of the thoracic spine. IMPRESSION: 1. No acute cardiopulmonary process. 2. Mild to moderate  cardiomegaly. 3. Tortuous thoracic aorta as seen on prior CT. Electronically Signed   By: Yvonne Kendall M.D.   On: 06/27/2022 20:31   CT ABDOMEN PELVIS WO CONTRAST  Result Date: 06/27/2022 CLINICAL DATA:  Sepsis flank pain EXAM: CT ABDOMEN AND PELVIS WITHOUT CONTRAST TECHNIQUE: Multidetector CT imaging of the abdomen and pelvis was performed following the standard protocol without IV contrast. RADIATION DOSE REDUCTION: This exam was performed according to the departmental dose-optimization program which includes automated exposure control, adjustment of the mA and/or kV according to patient size and/or use of iterative reconstruction technique. COMPARISON:  CT 12/30/2021  FINDINGS: Lower chest: Lung bases demonstrate no acute airspace disease. Coronary vascular calcification. Hepatobiliary: Calcified granuloma. No calcified gallstone or biliary dilatation. Pancreas: Unremarkable. No pancreatic ductal dilatation or surrounding inflammatory changes. Spleen: Normal in size without focal abnormality. Adrenals/Urinary Tract: Adrenal glands are normal. Mild atrophy left kidney. Prominent right renal pelvis without hydroureter or calyceal dilatation. Interim placement of left-sided ureteral stent with proximal pigtail in the left renal pelvis and distal pigtail in the right anterior bladder. Residual moderate severe hydronephrosis but decreased compared to prior CT. The bladder is otherwise unremarkable Stomach/Bowel: The stomach is nonenlarged. No dilated Merline Perkin bowel. No acute bowel wall thickening. Negative appendix. Vascular/Lymphatic: Advanced aortic atherosclerosis. 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 No suspicious lymph nodes. Reproductive: Enlarged prostate Other: Negative for pelvic effusion or free air. Shunt catheter tubing with tip in the right lower quadrant. Shianne Zeiser volume fluid in the right lower quadrant and pelvis likely related  to the shunt. Musculoskeletal: No acute osseous abnormality. Stable augmentation changes at L3 IMPRESSION: 1. Interim placement of left-sided ureteral stent. Residual moderate to severe left-sided hydronephrosis but decreased compared to prior CT. Mild left renal atrophy. 2. Shunt catheter tubing with tip in the right lower quadrant. Danyeal Akens volume fluid in the right lower quadrant and pelvis likely related to shunt catheter. 3. Aortic atherosclerosis. Aortic Atherosclerosis (ICD10-I70.0). Electronically Signed   By: Donavan Foil M.D.   On: 06/27/2022 20:15   CT Head Wo Contrast  Result Date: 06/27/2022 CLINICAL DATA:  Altered level of consciousness, lethargy, fever, recent urologic procedure EXAM: CT HEAD WITHOUT CONTRAST TECHNIQUE: Contiguous axial images were obtained from the base of the skull through the vertex without intravenous contrast. RADIATION DOSE REDUCTION: This exam was performed according to the departmental dose-optimization program which includes automated exposure control, adjustment of the mA and/or kV according to patient size and/or use of iterative reconstruction technique. COMPARISON:  10/10/2019 FINDINGS: Brain: Stable position of the right frontal ventriculostomy catheter. Prior abandoned catheter from right parietal approach also unchanged. Confluent hypodensities throughout the periventricular white matter consistent with chronic Jaccob Czaplicki vessel ischemic changes, stable. No evidence of acute infarct or hemorrhage. Stable appearance of the lateral ventricles and midline structures. No acute extra-axial fluid collections. No mass effect. Vascular: No hyperdense vessel or unexpected calcification. Skull: Normal. Negative for fracture or focal lesion. Sinuses/Orbits: No acute finding. Other: None. IMPRESSION: 1. Stable head CT, no acute intracranial process. Electronically Signed   By: Randa Ngo M.D.   On: 06/27/2022 19:17    Procedures Procedures    Medications Ordered in  ED Medications  potassium chloride 10 mEq in 100 mL IVPB (10 mEq Intravenous New Bag/Given 06/27/22 2102)  ceFEPIme (MAXIPIME) 2 g in sodium chloride 0.9 % 100 mL IVPB (0 g Intravenous Stopped 06/27/22 2101)  potassium chloride (KLOR-CON) packet 60 mEq (60 mEq Oral Given 06/27/22 1948)  sodium chloride 0.9 % bolus 1,000 mL (0 mLs Intravenous Stopped 06/27/22 2101)    ED Course/ Medical Decision Making/ A&P                           Medical Decision Making Patient is 76 year old male, here for lethargy, fever since Sunday morning.  Recently had ureteral stent placed by Dr. Abner Greenspan, for hydronephrosis.  He is ill-appearing on exam, CT head ordered by triage given slurred speech, and basic labs.  Shows a hypokalemia of 2.8.  Discussed with Dr. Sharlett Iles who evaluated bedside, believes likely secondary to sepsis, not likely secondary to CVA given fever, recent surgery.  Will obtain blood cultures, urinalysis, start antibiotics.  Potassium replacement placed by Dr. Sharlett Iles.  CT abdomen pelvis ordered by Dr. Boykin Reaper to evaluate for hydronephrosis, blockage.  Amount and/or Complexity of Data Reviewed Labs: ordered.    Details: Labs are significant for leukocytosis of 18+, K of 2.8.  Urinalysis significant for diffuse white blood cells, blood, bacteria. Radiology: ordered.    Details: CT abdomen all this, shows improved hydronephrosis but still present.  Chest x-ray clear.  Head CT shows no acute findings. Discussion of management or test interpretation with external provider(s): Patient is 76 year old male, here for fever, found to be afebrile on exam 100.9, in triage, given Tylenol.  Has a white count of 18+, normal lactate, but significant urinary tract infection, and recent ureteral stent placement.  Discussed with urology on-call Dr. Barbee Cough, they recommend continuing antibiotics, getting urine culture, and they will evaluate in a.m.  They also recommend placing Foley catheter.  Will admit secondary  to concerns for sepsis, and need for further IV antibiotics.  Risk Prescription drug management. Decision regarding hospitalization.   Final Clinical Impression(s) / ED Diagnoses Final diagnoses:  Urinary tract infection with hematuria, site unspecified  Sepsis secondary to UTI [facility])    Rx / DC Orders ED Discharge Orders     None         Osvaldo Shipper, PA 06/27/22 2207    Fransico Meadow, MD 06/28/22 [phone]

## 2022-06-27 NOTE — Assessment & Plan Note (Signed)
Pt also having onset of wheezing and SOB since arrival to ED earlier this evening. H/o COPD per family at bedside. CXR neg COPD pathway LABA, LAMA, and INH steroid PRN SABA Will hold off on systemic steroids for the moment Adult wheeze protocol

## 2022-06-27 NOTE — Progress Notes (Signed)
A consult was received from an ED physician for cefepime per pharmacy dosing.  The patient's profile has been reviewed for ht/wt/allergies/indication/available labs.    I agree with the one time order already placed by provider for Cefepime 2 g IV.    Further antibiotics/pharmacy consults should be ordered by admitting physician if indicated.                       Thank you, Suzzanne Cloud, PharmD, BCPS 06/27/2022  7:18 PM

## 2022-06-27 NOTE — Assessment & Plan Note (Signed)
Pt with UTI, recent ureteral stent placement just a couple of days ago. Normal lactate, tachycardia resolved after IVF in ED. Cefepime Repeat CBC/BMP in AM UCx pending BCx pending Tylenol PRN for fever Tele monitor IVF

## 2022-06-27 NOTE — Assessment & Plan Note (Signed)
Creat 2.6 today, appears fairly close to baseline.

## 2022-06-27 NOTE — ED Triage Notes (Signed)
Pt had surgery 3 days ago. Pt c/o lethargy and fever since Saturday morning. Pt has Parkinson's. Pt usually uses walker at baseline for a few steps. Pt currently afebrile in triage. Pt wife states Dr. Abner Greenspan (urology) called in antibiotic that pt started yesterday.

## 2022-06-27 NOTE — Assessment & Plan Note (Signed)
Chronic hydronephrosis of L kidney Got stent placed to see if any improvement in renal fxn. Unfortunately not really much improvement in renal fxn. Just had stent changed out x3 days ago.

## 2022-06-27 NOTE — Assessment & Plan Note (Signed)
With dementia. Cont home sinemet

## 2022-06-27 NOTE — Progress Notes (Signed)
Pharmacy Antibiotic Note  Albert Hahn is a 76 y.o. male admitted on 06/27/2022 with . male, hx of Parkinsons and Lewy Body dementia, who presents 2/2 to weakness and fatigue for the last day.  Wife states that he had a ureteral stent placed about 4 days ago, and was doing well, until on Sunday morning he developed a low-grade fever and severe fatigue. Marland Kitchen  Pharmacy has been consulted to dose cefepime for UTI, 1st dose given in the ED  Plan: Cefepime 2gm IV q24h Follow renal function, cultures and clinical course     Temp (24hrs), Avg:98.4 F (36.9 C), Min:97.9 F (36.6 C), Max:98.8 F (37.1 C)  Recent Labs  Lab 06/27/22 1745 06/27/22 1915 06/27/22 2110  WBC 18.4*  --   --   CREATININE 2.62*  --   --   LATICACIDVEN  --  1.8 1.9    Estimated Creatinine Clearance: 24.5 mL/min (A) (by C-G formula based on SCr of 2.62 mg/dL (H)).    Allergies  Allergen Reactions   Bee Venom Anaphylaxis   Shellfish Allergy Anaphylaxis   Wasp Venom Anaphylaxis   Lisinopril Cough   Other Nausea And Vomiting and Other (See Comments)    Unknown anesthetic spray - nausea/vomiting   Codeine Nausea And Vomiting     Thank you for allowing pharmacy to be a part of this patient's care.  Dolly Rias RPh 06/27/2022, 10:21 PM

## 2022-06-27 NOTE — Sepsis Progress Note (Signed)
Following for sepsis monitoring ?

## 2022-06-27 NOTE — ED Provider Notes (Signed)
At time of shift change, preceding ED provider awaiting admission call > 30 minutes. At 10:40 pm Consult called from hospitalist, Dr. Alcario Drought, who is agreeable to accepting this patient to his service. I appreciate his collaboration in the care of this patient.   Please see associated note from preceding ED provider Fatima Sanger, PA-C for insight into the patient's ED course. I assisted in consultation for admission, however was not involved in the workup or establishment of a treatment plan prior to this consult call.   This chart was dictated using voice recognition software, Dragon. Despite the best efforts of this provider to proofread and correct errors, errors may still occur which can change documentation meaning.    Aura Dials 06/27/22 2242    Fransico Meadow, MD 06/28/22 [id number]

## 2022-06-28 DIAGNOSIS — N390 Urinary tract infection, site not specified: Secondary | ICD-10-CM | POA: Diagnosis not present

## 2022-06-28 DIAGNOSIS — T83592A Infection and inflammatory reaction due to indwelling ureteral stent, initial encounter: Secondary | ICD-10-CM | POA: Diagnosis present

## 2022-06-28 DIAGNOSIS — N179 Acute kidney failure, unspecified: Secondary | ICD-10-CM | POA: Diagnosis present

## 2022-06-28 DIAGNOSIS — N184 Chronic kidney disease, stage 4 (severe): Secondary | ICD-10-CM | POA: Diagnosis present

## 2022-06-28 DIAGNOSIS — J439 Emphysema, unspecified: Secondary | ICD-10-CM | POA: Diagnosis present

## 2022-06-28 DIAGNOSIS — Z6830 Body mass index (BMI) 30.0-30.9, adult: Secondary | ICD-10-CM | POA: Diagnosis not present

## 2022-06-28 DIAGNOSIS — A419 Sepsis, unspecified organism: Secondary | ICD-10-CM | POA: Diagnosis present

## 2022-06-28 DIAGNOSIS — G901 Familial dysautonomia [Riley-Day]: Secondary | ICD-10-CM | POA: Diagnosis present

## 2022-06-28 DIAGNOSIS — Z87891 Personal history of nicotine dependence: Secondary | ICD-10-CM | POA: Diagnosis not present

## 2022-06-28 DIAGNOSIS — Y732 Prosthetic and other implants, materials and accessory gastroenterology and urology devices associated with adverse incidents: Secondary | ICD-10-CM | POA: Diagnosis present

## 2022-06-28 DIAGNOSIS — I129 Hypertensive chronic kidney disease with stage 1 through stage 4 chronic kidney disease, or unspecified chronic kidney disease: Secondary | ICD-10-CM | POA: Diagnosis present

## 2022-06-28 DIAGNOSIS — Z8572 Personal history of non-Hodgkin lymphomas: Secondary | ICD-10-CM | POA: Diagnosis not present

## 2022-06-28 DIAGNOSIS — Z96642 Presence of left artificial hip joint: Secondary | ICD-10-CM | POA: Diagnosis present

## 2022-06-28 DIAGNOSIS — F05 Delirium due to known physiological condition: Secondary | ICD-10-CM | POA: Diagnosis present

## 2022-06-28 DIAGNOSIS — E440 Moderate protein-calorie malnutrition: Secondary | ICD-10-CM | POA: Diagnosis present

## 2022-06-28 DIAGNOSIS — F32A Depression, unspecified: Secondary | ICD-10-CM | POA: Diagnosis present

## 2022-06-28 DIAGNOSIS — F0280 Dementia in other diseases classified elsewhere without behavioral disturbance: Secondary | ICD-10-CM | POA: Diagnosis present

## 2022-06-28 DIAGNOSIS — Y838 Other surgical procedures as the cause of abnormal reaction of the patient, or of later complication, without mention of misadventure at the time of the procedure: Secondary | ICD-10-CM | POA: Diagnosis present

## 2022-06-28 DIAGNOSIS — Z982 Presence of cerebrospinal fluid drainage device: Secondary | ICD-10-CM | POA: Diagnosis not present

## 2022-06-28 DIAGNOSIS — B965 Pseudomonas (aeruginosa) (mallei) (pseudomallei) as the cause of diseases classified elsewhere: Secondary | ICD-10-CM | POA: Diagnosis present

## 2022-06-28 DIAGNOSIS — Z1152 Encounter for screening for COVID-19: Secondary | ICD-10-CM | POA: Diagnosis not present

## 2022-06-28 DIAGNOSIS — F4310 Post-traumatic stress disorder, unspecified: Secondary | ICD-10-CM | POA: Diagnosis present

## 2022-06-28 DIAGNOSIS — E876 Hypokalemia: Secondary | ICD-10-CM | POA: Diagnosis present

## 2022-06-28 DIAGNOSIS — N136 Pyonephrosis: Secondary | ICD-10-CM | POA: Diagnosis present

## 2022-06-28 DIAGNOSIS — G3183 Dementia with Lewy bodies: Secondary | ICD-10-CM | POA: Diagnosis present

## 2022-06-28 DIAGNOSIS — G20A1 Parkinson's disease without dyskinesia, without mention of fluctuations: Secondary | ICD-10-CM | POA: Diagnosis present

## 2022-06-28 DIAGNOSIS — G9341 Metabolic encephalopathy: Secondary | ICD-10-CM | POA: Diagnosis present

## 2022-06-28 LAB — BASIC METABOLIC PANEL
Anion gap: 8 (ref 5–15)
BUN: 51 mg/dL — ABNORMAL HIGH (ref 8–23)
CO2: 21 mmol/L — ABNORMAL LOW (ref 22–32)
Calcium: 8.4 mg/dL — ABNORMAL LOW (ref 8.9–10.3)
Chloride: 105 mmol/L (ref 98–111)
Creatinine, Ser: 2.18 mg/dL — ABNORMAL HIGH (ref 0.61–1.24)
GFR, Estimated: 31 mL/min — ABNORMAL LOW (ref >60)
Glucose, Bld: 93 mg/dL (ref 70–99)
Potassium: 2.9 mmol/L — ABNORMAL LOW (ref 3.5–5.1)
Sodium: 134 mmol/L — ABNORMAL LOW (ref 135–145)

## 2022-06-28 LAB — CBC
HCT: 31.5 % — ABNORMAL LOW (ref 39.0–52.0)
Hemoglobin: 10.8 g/dL — ABNORMAL LOW (ref 13.0–17.0)
MCH: 32 pg (ref 26.0–34.0)
MCHC: 34.3 g/dL (ref 30.0–36.0)
MCV: 93.5 fL (ref 80.0–100.0)
Platelets: 153 10*3/uL (ref 150–400)
RBC: 3.37 MIL/uL — ABNORMAL LOW (ref 4.22–5.81)
RDW: 13.3 % (ref 11.5–15.5)
WBC: 15.5 10*3/uL — ABNORMAL HIGH (ref 4.0–10.5)
nRBC: 0 % (ref 0.0–0.2)

## 2022-06-28 MED ORDER — MELATONIN 3 MG PO TABS
3.0000 mg | ORAL_TABLET | Freq: Once | ORAL | Status: AC
  Administered 2022-06-28: 3 mg via ORAL
  Filled 2022-06-28: qty 1

## 2022-06-28 MED ORDER — CARBIDOPA-LEVODOPA 25-100 MG PO TABS
2.0000 | ORAL_TABLET | Freq: Three times a day (TID) | ORAL | Status: AC
  Administered 2022-06-28: 2 via ORAL
  Filled 2022-06-28: qty 2

## 2022-06-28 MED ORDER — POTASSIUM CHLORIDE 10 MEQ/100ML IV SOLN
10.0000 meq | INTRAVENOUS | Status: AC
  Administered 2022-06-28 (×4): 10 meq via INTRAVENOUS
  Filled 2022-06-28 (×4): qty 100

## 2022-06-28 MED ORDER — AMLODIPINE BESYLATE 10 MG PO TABS
10.0000 mg | ORAL_TABLET | Freq: Every day | ORAL | Status: AC
  Administered 2022-06-28: 10 mg via ORAL
  Filled 2022-06-28: qty 1

## 2022-06-28 NOTE — Evaluation (Signed)
Physical Therapy Evaluation Patient Details Name: Albert Hahn MRN: [id number] DOB: 06-12-1946 Today's Date: 06/28/2022  History of Present Illness  Patient is a 76 year old male who presented on 11/12 with severe weakness and low grade fevers. Patient was admitted with sepsis secondary to UTI and chronic left hydronephrosis. Patient underwent a left ureteral stent exchange on 11/10.   PMH: anxiety, arthritis, depression, parkinson's disease, lewy body dementia, brain surgery, back surgery,  Clinical Impression  Pt admitted with above diagnosis.  Pt currently with functional limitations due to the deficits listed below (see PT Problem List). Pt will benefit from skilled PT to increase their independence and safety with mobility to allow discharge to the venue listed below.    Patient  resting in bed, wife present to provide information. Patient ambulates with assistance and RW , family assists with ADL's. 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  Per wife, plans are to return home. Will benefit from HHPT.        Recommendations for follow up therapy are one component of a multi-disciplinary discharge planning process, led by the attending physician.  Recommendations may be updated based on patient status, additional functional criteria and insurance authorization.  Follow Up Recommendations Home health PT      Assistance Recommended at Discharge Frequent or constant Supervision/Assistance  Patient can return home with the following  A lot of help with walking and/or transfers;A lot of help with bathing/dressing/bathroom;Assistance with cooking/housework;Assist for transportation;Help with stairs or ramp for entrance;Two people to help with bathing/dressing/bathroom    Equipment Recommendations None recommended by PT  Recommendations for Other Services       Functional Status Assessment Patient has had a recent decline in their functional status and demonstrates the ability to  make significant improvements in function in a reasonable and predictable amount of time.     Precautions / Restrictions Precautions Precautions: Fall      Mobility  Bed Mobility Overal bed mobility: Needs Assistance Bed Mobility: Supine to Sit, Sit to Supine, Rolling Rolling: Min assist   Supine to sit: Mod assist, +2 for safety/equipment, +2 for physical assistance Sit to supine: +2 for physical assistance, +2 for safety/equipment, Max assist   General bed mobility comments: with increased time and cues to transition to edge of bed.    Transfers Overall transfer level: Needs assistance Equipment used: Rolling walker (2 wheels) Transfers: Sit to/from Stand Sit to Stand: Mod assist, +2 safety/equipment, +2 physical assistance           General transfer comment: steady assist to rise from the  stretcher, unable to take any steps, starting to shake    Ambulation/Gait                  Stairs            Wheelchair Mobility    Modified Rankin (Stroke Patients Only)       Balance   Sitting-balance support: Feet supported, Bilateral upper extremity supported Sitting balance-Leahy Scale: Poor Sitting balance - Comments: tends to lean posterior, easily self distracted, emotional   Standing balance support: Bilateral upper extremity supported, Reliant on assistive device for balance Standing balance-Leahy Scale: Zero Standing balance comment: physical support needed to maintain standing                             Pertinent Vitals/Pain Pain Assessment Faces Pain Scale: Hurts a little bit Pain Location: IV with  potassium nurse aware Pain Descriptors / Indicators: Discomfort Pain Intervention(s): Monitored during session    Home Living Family/patient expects to be discharged to:: Private residence Living Arrangements: Spouse/significant other;Children Available Help at Discharge: Family;Available 24 hours/day;Personal care attendant Type  of Home: House Home Access: Ramped entrance       Home Layout: One level Home Equipment: Conservation officer, nature (2 wheels);Shower seat;Wheelchair - manual;Hospital bed;BSC/3in1      Prior Function Prior Level of Function : Needs assist               ADLs Comments: wife and caregiver provide physical assist for bathing, dressing and toileting tasks. patient was able to walk with RW with caregiver support.     Hand Dominance   Dominant Hand: Right    Extremity/Trunk Assessment   Upper Extremity Assessment Upper Extremity Assessment: Overall WFL for tasks assessed    Lower Extremity Assessment Lower Extremity Assessment: Generalized weakness    Cervical / Trunk Assessment Cervical / Trunk Assessment: Normal  Communication   Communication: No difficulties  Cognition Arousal/Alertness: Awake/alert Behavior During Therapy: WFL for tasks assessed/performed Overall Cognitive Status: History of cognitive impairments - at baseline                                 General Comments: patient reported hospital, year and month. noted to have h/o dementia. wife was present in room. 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        General Comments      Exercises     Assessment/Plan    PT Assessment Patient needs continued PT services  PT Problem List Decreased strength;Decreased mobility;Decreased safety awareness;Decreased knowledge of precautions;Decreased activity tolerance;Decreased cognition;Decreased balance;Decreased knowledge of use of DME       PT Treatment Interventions DME instruction;Therapeutic activities;Gait training;Therapeutic exercise;Patient/family education;Functional mobility training;Balance training    PT Goals (Current goals can be found in the Care Plan section)  Acute Rehab PT Goals Patient Stated Goal: to return home PT Goal Formulation: With  patient/family Time For Goal Achievement: 07/12/22 Potential to Achieve Goals: Fair    Frequency Min 3X/week     Co-evaluation PT/OT/SLP Co-Evaluation/Treatment: Yes Reason for Co-Treatment: For patient/therapist safety;To address functional/ADL transfers PT goals addressed during session: Mobility/safety with mobility OT goals addressed during session: ADL's and self-care       AM-PAC PT "6 Clicks" Mobility  Outcome Measure Help needed turning from your back to your side while in a flat bed without using bedrails?: A Lot Help needed moving from lying on your back to sitting on the side of a flat bed without using bedrails?: Total Help needed moving to and from a bed to a chair (including a wheelchair)?: Total Help needed standing up from a chair using your arms (e.g., wheelchair or bedside chair)?: Total Help needed to walk in hospital room?: Total Help needed climbing 3-5 steps with a railing? : Total 6 Click Score: 7    End of Session Equipment Utilized During Treatment: Gait belt Activity Tolerance: Patient limited by fatigue Patient left: in bed;with call bell/phone within reach;with family/visitor present Nurse Communication: Mobility status PT Visit Diagnosis: Unsteadiness on feet (R26.81);Muscle weakness (generalized) (M62.81);Difficulty in walking, not elsewhere classified (R26.2)    Time: 2376-2831 PT Time Calculation (min) (ACUTE ONLY): 30 min   Charges:   PT Evaluation $PT Eval Low  Complexity: [place] Office [phone] Weekend [phone]   Claretha Cooper 06/28/2022, 2:35 PM

## 2022-06-28 NOTE — Progress Notes (Signed)
PROGRESS NOTE    Albert Hahn  OEH:[id number] DOB: 24-Apr-1946 DOA: 06/27/2022  PCP: Asencion Noble, MD   Brief Narrative: This 76 years old male with PMH significant for hypertension, COPD, Parkinson's disease, left-sided hydronephrosis s/p ureteral stent, CKD stage IV, presented in the ED with low-grade fever and severe fatigue.  Patient underwent left ureteral stent change 3 days ago by Dr. Abner Greenspan.  He was started on Augmentin after the procedure.  Patient was found to have slurring words for last few hours and was found febrile with a temp of 100.9 and presented in the ED. Patient is admitted for sepsis secondary to UTI.  Assessment & Plan:   Principal Problem:   Sepsis secondary to UTI [facility]) Active Problems:   Acute metabolic encephalopathy   COPD with acute exacerbation (HCC)   CKD (chronic kidney disease) stage 4, GFR 15-29 ml/min (HCC)   Hydronephrosis of left kidney   S/P VP shunt   Parkinson's disease  Sepsis secondary to UTI Patient s/p recent ureteral stent exchange 2 days ago by Dr. Abner Greenspan. He presented in the ED with fever,  generalized fatigue. He was tachycardic, febrile, lactic acid was normal Started on IV cefepime.  Blood cultures urine cultures pending.  COPD: Patient was found to have wheezing and shortness of breath on arrival in the ED. Chest xray showed no acute infiltrate. Continue with scheduled and as needed DuoNeb. Hold off on IV Solu-Medrol.   Acute metabolic Encephalopathy Likely secondary to UTI with sepsis. Continue IV antibiotics.  High risk for delirium. Continue delirium precautions.   Hydronephrosis of left kidney: Have chronic hydronephrosis of L kidney CT scan shows improving hydronephrosis as compared to the prior CT.   CKD stage IV: Serum creatinine 2.6 which is close to baseline. Avoid nephrotoxic medications, continue to follow serum creatinine.   Parkinson's disease with dementia: Continue home Sinemet.   DVT prophylaxis:  Lovenox Code Status:Full code Family Communication: Wife at bed side Disposition Plan:   Status is: Inpatient Remains inpatient appropriate because: Admitted for sepsis secondary to UTI.  Follow-up blood and urine cultures.   Patient is not medically clear, needs IV antibiotics.  Consultants:  Urology  Procedures: None  Antimicrobials: Anti-infectives (From admission, onward)    Start     Dose/Rate Route Frequency Ordered Stop   06/28/22 2000  ceFEPIme (MAXIPIME) 2 g in sodium chloride 0.9 % 100 mL IVPB        2 g 200 mL/hr over 30 Minutes Intravenous Every 24 hours 06/27/22 2221     06/27/22 1915  ceFEPIme (MAXIPIME) 2 g in sodium chloride 0.9 % 100 mL IVPB        2 g 200 mL/hr over 30 Minutes Intravenous  Once 06/27/22 1910 06/27/22 2101        Subjective: Patient was seen and examined at bedside.  Overnight events noted.   Patient reports doing better.  Wife is at baseline states he is at his baseline mental status.  Objective: Vitals:   06/28/22 0900 06/28/22 0912 06/28/22 1258 06/28/22 1400  BP: (!) 145/68 (!) 145/68  (!) 192/115  Pulse: 72 76  85  Resp:  20  (!) 22  Temp:  98.2 F (36.8 C) 98.2 F (36.8 C)   TempSrc:  Oral    SpO2: 96% 97%  96%    Intake/Output Summary (Last 24 hours) at 06/28/2022 1444 Last data filed at 06/27/2022 2101 Gross per 24 hour  Intake 1100 ml  Output --  Net 1100  ml   There were no vitals filed for this visit.  Examination:  General exam: Appears comfortable, not in any acute distress, deconditioned. Respiratory system: CTA bilaterally, respiratory effort normal, RR 15. Cardiovascular system: S1 & S2 heard, regular rate and rhythm, no murmur. Gastrointestinal system: Abdomen is soft, non tender, non distended, BS+heard. Central nervous system: Alert and oriented x1.  Following commands.  Awake. Extremities: No edema, no cyanosis, no clubbing. Skin: No rashes, lesions or ulcers Psychiatry: Not assessed.    Data  Reviewed: I have personally reviewed following labs and imaging studies  CBC: Recent Labs  Lab 06/27/22 1745 06/28/22 0600  WBC 18.4* 15.5*  NEUTROABS 15.4*  --   HGB 14.1 10.8*  HCT 42.0 31.5*  MCV 94.0 93.5  PLT 199 563   Basic Metabolic Panel: Recent Labs  Lab 06/27/22 1745 06/27/22 1916 06/28/22 0600  NA 138  --  134*  K 2.8*  --  2.9*  CL 103  --  105  CO2 21*  --  21*  GLUCOSE 113*  --  93  BUN 55*  --  51*  CREATININE 2.62*  --  2.18*  CALCIUM 9.1  --  8.4*  MG  --  2.0  --    GFR: Estimated Creatinine Clearance: 29.4 mL/min (A) (by C-G formula based on SCr of 2.18 mg/dL (H)). Liver Function Tests: Recent Labs  Lab 06/27/22 1745  AST 17  ALT <5  ALKPHOS 91  BILITOT 0.8  PROT 7.5  ALBUMIN 3.8   No results for input(s): "LIPASE", "AMYLASE" in the last 168 hours. No results for input(s): "AMMONIA" in the last 168 hours. Coagulation Profile: No results for input(s): "INR", "PROTIME" in the last 168 hours. Cardiac Enzymes: No results for input(s): "CKTOTAL", "CKMB", "CKMBINDEX", "TROPONINI" in the last 168 hours. BNP (last 3 results) No results for input(s): "PROBNP" in the last 8760 hours. HbA1C: No results for input(s): "HGBA1C" in the last 72 hours. CBG: Recent Labs  Lab 06/27/22 1738  GLUCAP 112*   Lipid Profile: No results for input(s): "CHOL", "HDL", "LDLCALC", "TRIG", "CHOLHDL", "LDLDIRECT" in the last 72 hours. Thyroid Function Tests: No results for input(s): "TSH", "T4TOTAL", "FREET4", "T3FREE", "THYROIDAB" in the last 72 hours. Anemia Panel: No results for input(s): "VITAMINB12", "FOLATE", "FERRITIN", "TIBC", "IRON", "RETICCTPCT" in the last 72 hours. Sepsis Labs: Recent Labs  Lab 06/27/22 1915 06/27/22 2110  LATICACIDVEN 1.8 1.9    Recent Results (from the past 240 hour(s))  Blood culture (routine x 2)     Status: None (Preliminary result)   Collection Time: 06/27/22  7:15 PM   Specimen: BLOOD  Result Value Ref Range Status    Specimen Description   Final    BLOOD RIGHT ANTECUBITAL Performed at [facility] [address] [place], [place] [address]    Special Requests   Final    BOTTLES DRAWN AEROBIC AND ANAEROBIC Blood Culture adequate volume Performed at [facility] [address], [place], [place] [address]    Culture   Final    NO GROWTH < 12 HOURS Performed at [facility] [address] [place], [place] [address]    Report Status PENDING  Incomplete  Resp Panel by RT-PCR (Flu A&B, Covid) Anterior Nasal Swab     Status: None   Collection Time: 06/27/22  8:51 PM   Specimen: Anterior Nasal Swab  Result Value Ref Range Status   SARS Coronavirus 2 by RT PCR NEGATIVE NEGATIVE Final  Comment: (NOTE) SARS-CoV-2 target nucleic acids are NOT DETECTED.  The SARS-CoV-2 RNA is generally detectable in upper respiratory specimens during the acute phase of infection. The lowest concentration of SARS-CoV-2 viral copies this assay can detect is 138 copies/mL. A negative result does not preclude SARS-Cov-2 infection and should not be used as the sole basis for treatment or other patient management decisions. A negative result may occur with  improper specimen collection/handling, submission of specimen other than nasopharyngeal swab, presence of viral mutation(s) within the areas targeted by this assay, and inadequate number of viral copies(<138 copies/mL). A negative result must be combined with clinical observations, patient history, and epidemiological information. The expected result is Negative.  Fact Sheet for Patients:  EntrepreneurPulse.com.au  Fact Sheet for Healthcare Providers:  IncredibleEmployment.be  This test is no t yet approved or cleared by the [place] FDA and  has been authorized for detection and/or diagnosis of SARS-CoV-2 by FDA under an Emergency Use Authorization (EUA). This EUA will remain  in  effect (meaning this test can be used) for the duration of the COVID-19 declaration under Section 564(b)(1) of the Act, 21 U.S.C.section 360bbb-3(b)(1), unless the authorization is terminated  or revoked sooner.       Influenza A by PCR NEGATIVE NEGATIVE Final   Influenza B by PCR NEGATIVE NEGATIVE Final    Comment: (NOTE) The Xpert Xpress SARS-CoV-2/FLU/RSV plus assay is intended as an aid in the diagnosis of influenza from Nasopharyngeal swab specimens and should not be used as a sole basis for treatment. Nasal washings and aspirates are unacceptable for Xpert Xpress SARS-CoV-2/FLU/RSV testing.  Fact Sheet for Patients: EntrepreneurPulse.com.au  Fact Sheet for Healthcare Providers: IncredibleEmployment.be  This test is not yet approved or cleared by the [place] FDA and has been authorized for detection and/or diagnosis of SARS-CoV-2 by FDA under an Emergency Use Authorization (EUA). This EUA will remain in effect (meaning this test can be used) for the duration of the COVID-19 declaration under Section 564(b)(1) of the Act, 21 U.S.C. section 360bbb-3(b)(1), unless the authorization is terminated or revoked.  Performed at [facility], [place] [address], [place], [place] [address]   Blood culture (routine x 2)     Status: None (Preliminary result)   Collection Time: 06/27/22  9:10 PM   Specimen: BLOOD  Result Value Ref Range Status   Specimen Description   Final    BLOOD LEFT ANTECUBITAL Performed at [facility] [address] [place], [place] [address]    Special Requests   Final    BOTTLES DRAWN AEROBIC AND ANAEROBIC Blood Culture adequate volume Performed at [facility] [address] [place],  [address]    Culture   Final    NO GROWTH < 12 HOURS Performed at [facility] [address], [place],  [address]    Report Status PENDING  Incomplete     Radiology Studies: DG Chest Port 1 View  Result Date: 06/27/2022 CLINICAL DATA:  Confusion.  Surgery 3 days ago. EXAM: PORTABLE CHEST 1 VIEW COMPARISON:  AP chest 10/28/2019, CT chest 10/22/2019 FINDINGS: Cardiac silhouette is again mildly to moderately enlarged. Mediastinal contours are unchanged with mildly tortuous thoracic aorta as seen on prior CT. Note is made the ascending aorta measures up to 4.1 cm in caliber on prior CT. Mildly decreased lung volumes. The lungs appear clear. No pleural effusion pneumothorax. Moderate multilevel degenerative disc changes of the thoracic spine. IMPRESSION: 1. No acute cardiopulmonary  process. 2. Mild to moderate cardiomegaly. 3. Tortuous thoracic aorta as seen on prior CT. Electronically Signed   By: Yvonne Kendall M.D.   On: 06/27/2022 20:31   CT ABDOMEN PELVIS WO CONTRAST  Result Date: 06/27/2022 CLINICAL DATA:  Sepsis flank pain EXAM: CT ABDOMEN AND PELVIS WITHOUT CONTRAST TECHNIQUE: Multidetector CT imaging of the abdomen and pelvis was performed following the standard protocol without IV contrast. RADIATION DOSE REDUCTION: This exam was performed according to the departmental dose-optimization program which includes automated exposure control, adjustment of the mA and/or kV according to patient size and/or use of iterative reconstruction technique. COMPARISON:  CT 12/30/2021 FINDINGS: Lower chest: Lung bases demonstrate no acute airspace disease. Coronary vascular calcification. Hepatobiliary: Calcified granuloma. No calcified gallstone or biliary dilatation. Pancreas: Unremarkable. No pancreatic ductal dilatation or surrounding inflammatory changes. Spleen: Normal in size without focal abnormality. Adrenals/Urinary Tract: Adrenal glands are normal. Mild atrophy left kidney. Prominent right renal pelvis without hydroureter or calyceal dilatation. Interim placement of left-sided ureteral stent with proximal pigtail in the left renal pelvis and distal pigtail  in the right anterior bladder. Residual moderate severe hydronephrosis but decreased compared to prior CT. The bladder is otherwise unremarkable Stomach/Bowel: The stomach is nonenlarged. No dilated small bowel. No acute bowel wall thickening. Negative appendix. Vascular/Lymphatic: Advanced aortic atherosclerosis. 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 No suspicious lymph nodes. Reproductive: Enlarged prostate Other: Negative for pelvic effusion or free air. Shunt catheter tubing with tip in the right lower quadrant. Small volume fluid in the right lower quadrant and pelvis likely related to the shunt. Musculoskeletal: No acute osseous abnormality. Stable augmentation changes at L3 IMPRESSION: 1. Interim placement of left-sided ureteral stent. Residual moderate to severe left-sided hydronephrosis but decreased compared to prior CT. Mild left renal atrophy. 2. Shunt catheter tubing with tip in the right lower quadrant. Small volume fluid in the right lower quadrant and pelvis likely related to shunt catheter. 3. Aortic atherosclerosis. Aortic Atherosclerosis (ICD10-I70.0). Electronically Signed   By: Donavan Foil M.D.   On: 06/27/2022 20:15   CT Head Wo Contrast  Result Date: 06/27/2022 CLINICAL DATA:  Altered level of consciousness, lethargy, fever, recent urologic procedure EXAM: CT HEAD WITHOUT CONTRAST TECHNIQUE: Contiguous axial images were obtained from the base of the skull through the vertex without intravenous contrast. RADIATION DOSE REDUCTION: This exam was performed according to the departmental dose-optimization program which includes automated exposure control, adjustment of the mA and/or kV according to patient size and/or use of iterative reconstruction technique. COMPARISON:  10/10/2019 FINDINGS: Brain: Stable position of the right frontal ventriculostomy catheter. Prior abandoned catheter from right parietal approach also  unchanged. Confluent hypodensities throughout the periventricular white matter consistent with chronic small vessel ischemic changes, stable. No evidence of acute infarct or hemorrhage. Stable appearance of the lateral ventricles and midline structures. No acute extra-axial fluid collections. No mass effect. Vascular: No hyperdense vessel or unexpected calcification. Skull: Normal. Negative for fracture or focal lesion. Sinuses/Orbits: No acute finding. Other: None. IMPRESSION: 1. Stable head CT, no acute intracranial process. Electronically Signed   By: Randa Ngo M.D.   On: 06/27/2022 19:17    Scheduled Meds:  enoxaparin (LOVENOX) injection  30 mg Subcutaneous Q24H   fluticasone furoate-vilanterol  1 puff Inhalation Daily   umeclidinium bromide  1 puff Inhalation Daily   Continuous Infusions:  ceFEPime (MAXIPIME) IV     lactated ringers Stopped (06/28/22 0910)  LOS: 0 days    Time spent: 50 mins    Shawna Clamp, MD [facility]   If 7PM-7AM, please contact night-coverage

## 2022-06-28 NOTE — Evaluation (Signed)
Occupational Therapy Evaluation Patient Details Name: Albert Hahn MRN: [id number] DOB: Oct 07, 1945 Today's Date: 06/28/2022   History of Present Illness Patient is a 76 year old male who presented on 11/12 with severe weakness and low grade fevers. Patient was admitted with sepsis secondary to UTI and chronic left hydronephrosis. Patient underwent a left ureteral stent exchange on 11/10.   PMH: anxiety, arthritis, depression, parkinson's disease, lewy body dementia, brain surgery, back surgery,   Clinical Impression   Patient is a 76 year old male who was admitted for above. Patient was living at home with 24/7 caregiver support and supervision at home from wife and caregiver. Patient was +2 for bed mobility, and standing with RW at edge of stretcher with TD for LB Dressing tasks. Patient's wife present reporting plan is for patient to transition back home at time of d/c. Patient was noted to have decreased functional activity tolernace, decreased ROM, decreased BUE strength, decreased endurance, decreased sitting balance, decreased standing balanced, decreased safety awareness, and decreased knowledge of AE/AD impacting participation in ADLs. Patient would continue to benefit from skilled OT services at this time while admitted and after d/c to address noted deficits in order to improve overall safety and independence in ADLs.        Recommendations for follow up therapy are one component of a multi-disciplinary discharge planning process, led by the attending physician.  Recommendations may be updated based on patient status, additional functional criteria and insurance authorization.   Follow Up Recommendations  Home health OT     Assistance Recommended at Discharge Frequent or constant Supervision/Assistance  Patient can return home with the following Two people to help with walking and/or transfers;Two people to help with bathing/dressing/bathroom;Direct supervision/assist for  medications management;Direct supervision/assist for financial management;Assist for transportation;Assistance with cooking/housework;Assistance with feeding;Help with stairs or ramp for entrance    Functional Status Assessment  Patient has had a recent decline in their functional status and demonstrates the ability to make significant improvements in function in a reasonable and predictable amount of time.  Equipment Recommendations  None recommended by OT    Recommendations for Other Services       Precautions / Restrictions Precautions Precautions: Fall Restrictions Weight Bearing Restrictions: No      Mobility Bed Mobility Overal bed mobility: Needs Assistance Bed Mobility: Supine to Sit, Sit to Supine     Supine to sit: Mod assist, +2 for safety/equipment, +2 for physical assistance Sit to supine: +2 for physical assistance, +2 for safety/equipment, Max assist   General bed mobility comments: with increased time and cues to transition to edge of bed.       Balance Overall balance assessment: Needs assistance Sitting-balance support: Feet supported, Bilateral upper extremity supported Sitting balance-Leahy Scale: Poor     Standing balance support: Bilateral upper extremity supported, Reliant on assistive device for balance Standing balance-Leahy Scale: Zero Standing balance comment: physical support needed to maintain standing                           ADL either performed or assessed with clinical judgement   ADL Overall ADL's : Needs assistance/impaired Eating/Feeding: Moderate assistance;Sitting Eating/Feeding Details (indicate cue type and reason): education provided to wife to encourage patient to keep eating as much as he could prior to offering assistance. patients wife verbalized understanding. Grooming: Sitting;Minimal assistance   Upper Body Bathing: Bed level;Moderate assistance   Lower Body Bathing: Bed level;Total assistance   Upper  Body Dressing : Bed level;Moderate assistance   Lower Body Dressing: Bed level;Total assistance   Toilet Transfer: +2 for physical assistance;+2 for safety/equipment Toilet Transfer Details (indicate cue type and reason): patient attempted standing at stretcher with increased AE needed to maintain standing balance with RW. shakiness in BLE noted with increased standing. Toileting- Clothing Manipulation and Hygiene: Total assistance;Bed level       Functional mobility during ADLs: +2 for physical assistance;+2 for safety/equipment       Vision   Vision Assessment?: No apparent visual deficits     Perception     Praxis      Pertinent Vitals/Pain Pain Assessment Pain Assessment: Faces Faces Pain Scale: Hurts a little bit Pain Location: IV with potassium nurse aware Pain Descriptors / Indicators: Discomfort Pain Intervention(s): Monitored during session     Hand Dominance Right   Extremity/Trunk Assessment Upper Extremity Assessment Upper Extremity Assessment: Overall WFL for tasks assessed (ROM shoulers to about 90 degrees bilterally)   Lower Extremity Assessment Lower Extremity Assessment: Defer to PT evaluation   Cervical / Trunk Assessment Cervical / Trunk Assessment: Normal   Communication Communication Communication: No difficulties   Cognition Arousal/Alertness: Awake/alert Behavior During Therapy: WFL for tasks assessed/performed Overall Cognitive Status: History of cognitive impairments - at baseline           General Comments: patient reported hospital, year and month. noted to have h/o dementia. wife was present in room. 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                Home Living Family/patient expects to be discharged to:: Private residence Living Arrangements: Spouse/significant other;Children Available Help at Discharge: Family;Available 24  hours/day;Personal care attendant (caregiver M-F wife is present when caregiver is not)   Home Access: Ramped entrance   Entrance Stairs-Rails: Right Home Layout: One level     Bathroom Shower/Tub: Walk-in shower         Home Equipment: Conservation officer, nature (2 wheels);Shower seat;Wheelchair - manual;Hospital bed;BSC/3in1          Prior Functioning/Environment Prior Level of Function : Needs assist               ADLs Comments: wife and caregiver provide physical assist for bathing, dressing and toileting tasks. patient was able to walk with RW with caregiver support.        OT Problem List: Impaired balance (sitting and/or standing);Decreased knowledge of precautions;Decreased knowledge of use of DME or AE;Decreased activity tolerance;Decreased cognition      OT Treatment/Interventions: Self-care/ADL training;Therapeutic exercise;Neuromuscular education;Energy conservation;DME and/or AE instruction;Patient/family education;Balance training;Therapeutic activities    OT Goals(Current goals can be found in the care plan section) Acute Rehab OT Goals Patient Stated Goal: to go back home OT Goal Formulation: With patient/family Time For Goal Achievement: 07/12/22 Potential to Achieve Goals: Fair  OT Frequency: Min 2X/week    Co-evaluation PT/OT/SLP Co-Evaluation/Treatment: Yes Reason for Co-Treatment: To address functional/ADL transfers;For patient/therapist safety PT goals addressed during session: Mobility/safety with mobility OT goals addressed during session: ADL's and self-care      AM-PAC OT "6 Clicks" Daily Activity     Outcome Measure Help from another person eating meals?: A Little Help from another person taking care of personal grooming?: A Little Help from another person toileting, which includes using toliet, bedpan, or urinal?: Total Help from another person bathing (including washing, rinsing, drying)?: Total Help from another  person to put on and taking  off regular upper body clothing?: A Lot Help from another person to put on and taking off regular lower body clothing?: Total 6 Click Score: 11   End of Session Equipment Utilized During Treatment: Rolling walker (2 wheels) Nurse Communication: Mobility status  Activity Tolerance: Patient tolerated treatment well Patient left: in bed;with call bell/phone within reach;with family/visitor present  OT Visit Diagnosis: Unsteadiness on feet (R26.81);Other abnormalities of gait and mobility (R26.89);Muscle weakness (generalized) (M62.81)                Time: 0488-8916 OT Time Calculation (min): 32 min Charges:  OT General Charges $OT Visit: 1 Visit OT Evaluation $OT Eval Moderate Complexity: 1 Mod  Nandi Tonnesen OTR/L, MS Acute Rehabilitation Department Office# [phone]   Marcellina Millin 06/28/2022, 10:55 AM

## 2022-06-28 NOTE — Consult Note (Addendum)
Urology Consult   Physician requesting consult: Jennette Kettle, DO  Reason for consult: UTI s/p stent  History of Present Illness: Albert Hahn is a 76 y.o. male with a PMH of COPD, Parkinson's disease, CKD, and chronic left hydronephrosis managed with indwelling left ureteral stent who is 4 days s/p left ureteral stent exchange. Patient presented to the [facility] ED with complaints of low-grade fevers and severe weakness. He is accompanied by his wife, who states he was so weak he was unable to move from his chair, which is unusual for him. He also ran a low grade temp of 100.9. They called the Urology office and were started on Augmentin for presumed UTI. When he failed to imprpove on antibiotics, he was brought to the ED.  In ED, patient's afebrile and low grade tachy. Patient's labs notable for Cr 2.62 from 2.24. WBC 18.4. Lactate 1.8-->1.9. UA with large leukoctyes, negative nitrites, > 50 WBC. Ucx and Bcx pending. CT with appropriately positioned left ureteral stent with residual moderate to severe left-hydro that is decreased from prior. Foley catheter is in place draining cloudy yellow urine with sediment.  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   PTSD (post-traumatic stress disorder)     Past Surgical History:  Procedure Laterality Date   BACK SURGERY     L3-L4 Fusion per pt   BRAIN SURGERY     COLONOSCOPY W/ POLYPECTOMY     CYSTOSCOPY W/ URETERAL STENT PLACEMENT Left 12/30/2021   Procedure: CYSTOSCOPY WITH RETROGRADE PYELOGRAM/URETERAL STENT PLACEMENT;  Surgeon: Janith Lima, MD;  Location: [facility];  Service: Urology;   Laterality: Left;   CYSTOSCOPY W/ URETERAL STENT PLACEMENT Left 06/24/2022   Procedure: CYSTOSCOPY WITH RETROGRADE PYELOGRAM/URETERAL STENT EXCHANGE;  Surgeon: Janith Lima, MD;  Location: [facility];  Service: Urology;  Laterality: Left;  ONLY NEEDS 30 MIN   HERNIA REPAIR     JOINT REPLACEMENT     LUMBAR PUNCTURE  07/23/2012   severe headaches   NECK SURGERY     metal plates and screws in neck    TOTAL HIP ARTHROPLASTY Left    VENTRICULOPERITONEAL SHUNT N/A 11/25/2016   Procedure: SHUNT INSERTION VENTRICULAR-PERITONEAL;  Surgeon: Eustace Moore, MD;  Location: [facility];  Service: Neurosurgery;  Laterality: N/A;    Current Hospital Medications:  Home Meds:  No current facility-administered medications on file prior to encounter.   Current Outpatient Medications on File Prior to Encounter  Medication Sig Dispense Refill   acetaminophen (TYLENOL) 500 MG tablet Take 500-1,000 mg by mouth every 6 (six) hours as needed (for migraines or mild pain).     allopurinol (ZYLOPRIM) 100 MG tablet Take 100 mg by mouth daily.     amitriptyline (ELAVIL) 25 MG tablet Take 25 mg by mouth at bedtime.     amLODipine (NORVASC) 10 MG tablet Take 10 mg by mouth daily.     carbidopa-levodopa (SINEMET IR) 25-100 MG tablet Take 2 tablets by mouth See admin instructions. Take 2 tablets by mouth at 9:30 AM, 4  PM, and 9:30 PM     Cholecalciferol (VITAMIN D3) 50 MCG (2000 UT) TABS Take 4,000 Units by mouth daily.     docusate sodium (COLACE) 100 MG capsule Take 100 mg by mouth daily as needed for mild constipation.     Melatonin 3 MG CAPS Take 6 mg by mouth at bedtime.     QUEtiapine (SEROQUEL) 25 MG tablet Take 37.5-50 mg by mouth See admin instructions. Take 50 mg by mouth at 3 PM and 37.5 mg at bedtime     topiramate (TOPAMAX) 100 MG tablet Take 100 mg by mouth at bedtime.     amitriptyline (ELAVIL) 50 MG tablet Take 1.5 tablets (75 mg total) by mouth at bedtime. (Patient not taking: Reported on 06/27/2022)      hydrochlorothiazide (HYDRODIURIL) 25 MG tablet Take 0.5 tablets (12.5 mg total) by mouth daily. (Patient not taking: Reported on 06/27/2022)     Pimavanserin Tartrate 34 MG CAPS Take 34 mg by mouth daily. (Patient not taking: Reported on 06/27/2022)     potassium chloride SA (KLOR-CON M) 20 MEQ tablet Take 40 mEq by mouth daily. (Patient not taking: Reported on 06/27/2022)       Scheduled Meds:  enoxaparin (LOVENOX) injection  30 mg Subcutaneous Q24H   fluticasone furoate-vilanterol  1 puff Inhalation Daily   umeclidinium bromide  1 puff Inhalation Daily   Continuous Infusions:  ceFEPime (MAXIPIME) IV     lactated ringers 100 mL/hr at 06/27/22 2317   PRN Meds:.acetaminophen **OR** acetaminophen, albuterol, ondansetron **OR** ondansetron (ZOFRAN) IV  Allergies:  Allergies  Allergen Reactions   Bee Venom Anaphylaxis   Shellfish Allergy Anaphylaxis   Wasp Venom Anaphylaxis   Lisinopril Cough   Other Nausea And Vomiting and Other (See Comments)    Unknown anesthetic spray - nausea/vomiting   Codeine Nausea And Vomiting    Family History  Problem Relation Age of Onset   Healthy Mother    Healthy Father     Social History:  reports that he has quit smoking. He has never used smokeless tobacco. He reports that he does not drink alcohol and does not use drugs.  ROS: A complete review of systems was performed.  All systems are negative except for pertinent findings as noted.  Physical Exam:  Vital signs in last 24 hours: Temp:  [97.9 F (36.6 C)-98.8 F (37.1 C)] 98.7 F (37.1 C) (11/14 0343) Pulse Rate:  [88-107] 88 (11/14 0200) Resp:  [20-27] 20 (11/14 0200) BP: (121-156)/(72-98) 137/74 (11/14 0200) SpO2:  [95 %-99 %] 95 % (11/14 0200) Constitutional:  Alert and oriented, No acute distress Cardiovascular: Regular rate and rhythm, No JVD Respiratory: Normal respiratory effort, Lungs clear bilaterally GI: Abdomen is soft, nontender, nondistended, no abdominal masses GU:  No CVA tenderness. Foley catheter draining cloudy yellow urine with sediemtn Lymphatic: No lymphadenopathy Psychiatric: Normal mood and affect  Laboratory Data:  Recent Labs    06/27/22 1745  WBC 18.4*  HGB 14.1  HCT 42.0  PLT 199    Recent Labs    06/27/22 1745  NA 138  K 2.8*  CL 103  GLUCOSE 113*  BUN 55*  CALCIUM 9.1  CREATININE 2.62*     Results for orders placed or performed during the hospital encounter of 06/27/22 (from the past 24 hour(s))  POC CBG, ED     Status: Abnormal   Collection Time: 06/27/22  5:38 PM  Result Value Ref Range   Glucose-Capillary 112 (H) 70 - 99 mg/dL  CBC  with Differential     Status: Abnormal   Collection Time: 06/27/22  5:45 PM  Result Value Ref Range   WBC 18.4 (H) 4.0 - 10.5 K/uL   RBC 4.47 4.22 - 5.81 MIL/uL   Hemoglobin 14.1 13.0 - 17.0 g/dL   HCT 42.0 39.0 - 52.0 %   MCV 94.0 80.0 - 100.0 fL   MCH 31.5 26.0 - 34.0 pg   MCHC 33.6 30.0 - 36.0 g/dL   RDW 13.2 11.5 - 15.5 %   Platelets 199 150 - 400 K/uL   nRBC 0.0 0.0 - 0.2 %   Neutrophils Relative % 84 %   Neutro Abs 15.4 (H) 1.7 - 7.7 K/uL   Lymphocytes Relative 6 %   Lymphs Abs 1.1 0.7 - 4.0 K/uL   Monocytes Relative 7 %   Monocytes Absolute 1.3 (H) 0.1 - 1.0 K/uL   Eosinophils Relative 0 %   Eosinophils Absolute 0.0 0.0 - 0.5 K/uL   Basophils Relative 0 %   Basophils Absolute 0.1 0.0 - 0.1 K/uL   Immature Granulocytes 3 %   Abs Immature Granulocytes 0.49 (H) 0.00 - 0.07 K/uL  Comprehensive metabolic panel     Status: Abnormal   Collection Time: 06/27/22  5:45 PM  Result Value Ref Range   Sodium 138 135 - 145 mmol/L   Potassium 2.8 (L) 3.5 - 5.1 mmol/L   Chloride 103 98 - 111 mmol/L   CO2 21 (L) 22 - 32 mmol/L   Glucose, Bld 113 (H) 70 - 99 mg/dL   BUN 55 (H) 8 - 23 mg/dL   Creatinine, Ser 2.62 (H) 0.61 - 1.24 mg/dL   Calcium 9.1 8.9 - 10.3 mg/dL   Total Protein 7.5 6.5 - 8.1 g/dL   Albumin 3.8 3.5 - 5.0 g/dL   AST 17 15 - 41 U/L   ALT <5 0 - 44 U/L    Alkaline Phosphatase 91 38 - 126 U/L   Total Bilirubin 0.8 0.3 - 1.2 mg/dL   GFR, Estimated 25 (L) >60 mL/min   Anion gap 14 5 - 15  Lactic acid, plasma     Status: None   Collection Time: 06/27/22  7:15 PM  Result Value Ref Range   Lactic Acid, Venous 1.8 0.5 - 1.9 mmol/L  Magnesium     Status: None   Collection Time: 06/27/22  7:16 PM  Result Value Ref Range   Magnesium 2.0 1.7 - 2.4 mg/dL  Urinalysis, Routine w reflex microscopic     Status: Abnormal   Collection Time: 06/27/22  8:51 PM  Result Value Ref Range   Color, Urine YELLOW YELLOW   APPearance CLOUDY (A) CLEAR   Specific Gravity, Urine 1.016 1.005 - 1.030   pH 5.0 5.0 - 8.0   Glucose, UA NEGATIVE NEGATIVE mg/dL   Hgb urine dipstick LARGE (A) NEGATIVE   Bilirubin Urine NEGATIVE NEGATIVE   Ketones, ur 5 (A) NEGATIVE mg/dL   Protein, ur >=300 (A) NEGATIVE mg/dL   Nitrite NEGATIVE NEGATIVE   Leukocytes,Ua LARGE (A) NEGATIVE   RBC / HPF >50 (H) 0 - 5 RBC/hpf   WBC, UA >50 (H) 0 - 5 WBC/hpf   Bacteria, UA MANY (A) NONE SEEN   Squamous Epithelial / LPF 0-5 0 - 5   WBC Clumps PRESENT    Mucus PRESENT    Amorphous Crystal PRESENT   Resp Panel by RT-PCR (Flu A&B, Covid) Anterior Nasal Swab     Status: None   Collection Time: 06/27/22  8:51 PM   Specimen: Anterior Nasal Swab  Result Value Ref Range   SARS Coronavirus 2 by RT PCR NEGATIVE NEGATIVE   Influenza A by PCR NEGATIVE NEGATIVE   Influenza B by PCR NEGATIVE NEGATIVE  Lactic acid, plasma     Status: None   Collection Time: 06/27/22  9:10 PM  Result Value Ref Range   Lactic Acid, Venous 1.9 0.5 - 1.9 mmol/L   Recent Results (from the past 240 hour(s))  Resp Panel by RT-PCR (Flu A&B, Covid) Anterior Nasal Swab     Status: None   Collection Time: 06/27/22  8:51 PM   Specimen: Anterior Nasal Swab  Result Value Ref Range Status   SARS Coronavirus 2 by RT PCR NEGATIVE NEGATIVE Final    Comment: (NOTE) SARS-CoV-2 target nucleic acids are NOT DETECTED.  The  SARS-CoV-2 RNA is generally detectable in upper respiratory specimens during the acute phase of infection. The lowest concentration of SARS-CoV-2 viral copies this assay can detect is 138 copies/mL. A negative result does not preclude SARS-Cov-2 infection and should not be used as the sole basis for treatment or other patient management decisions. A negative result may occur with  improper specimen collection/handling, submission of specimen other than nasopharyngeal swab, presence of viral mutation(s) within the areas targeted by this assay, and inadequate number of viral copies(<138 copies/mL). A negative result must be combined with clinical observations, patient history, and epidemiological information. The expected result is Negative.  Fact Sheet for Patients:  EntrepreneurPulse.com.au  Fact Sheet for Healthcare Providers:  IncredibleEmployment.be  This test is no t yet approved or cleared by the [place] FDA and  has been authorized for detection and/or diagnosis of SARS-CoV-2 by FDA under an Emergency Use Authorization (EUA). This EUA will remain  in effect (meaning this test can be used) for the duration of the COVID-19 declaration under Section 564(b)(1) of the Act, 21 U.S.C.section 360bbb-3(b)(1), unless the authorization is terminated  or revoked sooner.       Influenza A by PCR NEGATIVE NEGATIVE Final   Influenza B by PCR NEGATIVE NEGATIVE Final    Comment: (NOTE) The Xpert Xpress SARS-CoV-2/FLU/RSV plus assay is intended as an aid in the diagnosis of influenza from Nasopharyngeal swab specimens and should not be used as a sole basis for treatment. Nasal washings and aspirates are unacceptable for Xpert Xpress SARS-CoV-2/FLU/RSV testing.  Fact Sheet for Patients: EntrepreneurPulse.com.au  Fact Sheet for Healthcare Providers: IncredibleEmployment.be  This test is not yet approved or  cleared by the [place] FDA and has been authorized for detection and/or diagnosis of SARS-CoV-2 by FDA under an Emergency Use Authorization (EUA). This EUA will remain in effect (meaning this test can be used) for the duration of the COVID-19 declaration under Section 564(b)(1) of the Act, 21 U.S.C. section 360bbb-3(b)(1), unless the authorization is terminated or revoked.  Performed at [facility], [place] [address], [place], [place] [address]     Renal Function: Recent Labs    06/27/22 1745  CREATININE 2.62*   Estimated Creatinine Clearance: 24.5 mL/min (A) (by C-G formula based on SCr of 2.62 mg/dL (H)).  Radiologic Imaging: DG Chest Port 1 View  Result Date: 06/27/2022 CLINICAL DATA:  Confusion.  Surgery 3 days ago. EXAM: PORTABLE CHEST 1 VIEW COMPARISON:  AP chest 10/28/2019, CT chest 10/22/2019 FINDINGS: Cardiac silhouette is again mildly to moderately enlarged. Mediastinal contours are unchanged with mildly tortuous thoracic aorta as seen on prior CT. Note is made the ascending aorta  measures up to 4.1 cm in caliber on prior CT. Mildly decreased lung volumes. The lungs appear clear. No pleural effusion pneumothorax. Moderate multilevel degenerative disc changes of the thoracic spine. IMPRESSION: 1. No acute cardiopulmonary process. 2. Mild to moderate cardiomegaly. 3. Tortuous thoracic aorta as seen on prior CT. Electronically Signed   By: Yvonne Kendall M.D.   On: 06/27/2022 20:31   CT ABDOMEN PELVIS WO CONTRAST  Result Date: 06/27/2022 CLINICAL DATA:  Sepsis flank pain EXAM: CT ABDOMEN AND PELVIS WITHOUT CONTRAST TECHNIQUE: Multidetector CT imaging of the abdomen and pelvis was performed following the standard protocol without IV contrast. RADIATION DOSE REDUCTION: This exam was performed according to the departmental dose-optimization program which includes automated exposure control, adjustment of the mA and/or kV according to patient size and/or use of  iterative reconstruction technique. COMPARISON:  CT 12/30/2021 FINDINGS: Lower chest: Lung bases demonstrate no acute airspace disease. Coronary vascular calcification. Hepatobiliary: Calcified granuloma. No calcified gallstone or biliary dilatation. Pancreas: Unremarkable. No pancreatic ductal dilatation or surrounding inflammatory changes. Spleen: Normal in size without focal abnormality. Adrenals/Urinary Tract: Adrenal glands are normal. Mild atrophy left kidney. Prominent right renal pelvis without hydroureter or calyceal dilatation. Interim placement of left-sided ureteral stent with proximal pigtail in the left renal pelvis and distal pigtail in the right anterior bladder. Residual moderate severe hydronephrosis but decreased compared to prior CT. The bladder is otherwise unremarkable Stomach/Bowel: The stomach is nonenlarged. No dilated small bowel. No acute bowel wall thickening. Negative appendix. Vascular/Lymphatic: Advanced aortic atherosclerosis. 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 No suspicious lymph nodes. Reproductive: Enlarged prostate Other: Negative for pelvic effusion or free air. Shunt catheter tubing with tip in the right lower quadrant. Small volume fluid in the right lower quadrant and pelvis likely related to the shunt. Musculoskeletal: No acute osseous abnormality. Stable augmentation changes at L3 IMPRESSION: 1. Interim placement of left-sided ureteral stent. Residual moderate to severe left-sided hydronephrosis but decreased compared to prior CT. Mild left renal atrophy. 2. Shunt catheter tubing with tip in the right lower quadrant. Small volume fluid in the right lower quadrant and pelvis likely related to shunt catheter. 3. Aortic atherosclerosis. Aortic Atherosclerosis (ICD10-I70.0). Electronically Signed   By: Donavan Foil M.D.   On: 06/27/2022 20:15   CT Head Wo Contrast  Result Date: 06/27/2022 CLINICAL  DATA:  Altered level of consciousness, lethargy, fever, recent urologic procedure EXAM: CT HEAD WITHOUT CONTRAST TECHNIQUE: Contiguous axial images were obtained from the base of the skull through the vertex without intravenous contrast. RADIATION DOSE REDUCTION: This exam was performed according to the departmental dose-optimization program which includes automated exposure control, adjustment of the mA and/or kV according to patient size and/or use of iterative reconstruction technique. COMPARISON:  10/10/2019 FINDINGS: Brain: Stable position of the right frontal ventriculostomy catheter. Prior abandoned catheter from right parietal approach also unchanged. Confluent hypodensities throughout the periventricular white matter consistent with chronic small vessel ischemic changes, stable. No evidence of acute infarct or hemorrhage. Stable appearance of the lateral ventricles and midline structures. No acute extra-axial fluid collections. No mass effect. Vascular: No hyperdense vessel or unexpected calcification. Skull: Normal. Negative for fracture or focal lesion. Sinuses/Orbits: No acute finding. Other: None. IMPRESSION: 1. Stable head CT, no acute intracranial process. Electronically Signed   By: Randa Ngo M.D.   On: 06/27/2022 19:17    I independently reviewed the above imaging studies.  Impression/Recommendation Sepsis secondary  to UTI Chronic left hydronephrosis s/p left ureteral stent exchange 06/24/22  - Stent appropriately positioned on CT with interval mild decrease in hydronephrosis, though still with severe hydro. No indication for exchange at this time - Foley catheter in place to drainage. Continue until afebrile x24h - Agree with broad-spectrum antimicrobial therapy. Follow-up cultures and cater accordingly - Trend WBC, Cr, and fever curve - Urology will continue to follow  Lamar Laundry 06/28/2022, 4:34 AM   I have seen and examined the patient and agree with the above  assessment and plan. Appreciate medical assistance. Obtained appropriate preoperative antibiotics. F/u urine cx and blood cx. Continue broad spectrum antibiotics. Foley to gravity. Stent in appropriate position, no indication for exchange. Discussed with pt and wife at bedside.  [facility]  Pager: [phone]

## 2022-06-29 DIAGNOSIS — E440 Moderate protein-calorie malnutrition: Secondary | ICD-10-CM | POA: Insufficient documentation

## 2022-06-29 LAB — HIV ANTIBODY (ROUTINE TESTING W REFLEX): HIV Screen 4th Generation wRfx: NONREACTIVE

## 2022-06-29 MED ORDER — CHLORHEXIDINE GLUCONATE CLOTH 2 % EX PADS
6.0000 | MEDICATED_PAD | Freq: Every day | CUTANEOUS | Status: DC
  Administered 2022-06-29 – 2022-07-01 (×3): 6 via TOPICAL

## 2022-06-29 MED ORDER — ALLOPURINOL 100 MG PO TABS
100.0000 mg | ORAL_TABLET | Freq: Every day | ORAL | Status: DC
  Administered 2022-06-29 – 2022-07-01 (×3): 100 mg via ORAL
  Filled 2022-06-29 (×3): qty 1

## 2022-06-29 MED ORDER — POTASSIUM CHLORIDE 20 MEQ PO PACK
40.0000 meq | PACK | Freq: Once | ORAL | Status: AC
  Administered 2022-06-29: 40 meq via ORAL
  Filled 2022-06-29: qty 2

## 2022-06-29 MED ORDER — AMLODIPINE BESYLATE 10 MG PO TABS
10.0000 mg | ORAL_TABLET | Freq: Every day | ORAL | Status: DC
  Administered 2022-06-29 – 2022-07-01 (×3): 10 mg via ORAL
  Filled 2022-06-29 (×3): qty 1

## 2022-06-29 MED ORDER — QUETIAPINE FUMARATE 25 MG PO TABS
37.5000 mg | ORAL_TABLET | Freq: Every day | ORAL | Status: DC
  Administered 2022-06-29 – 2022-06-30 (×2): 37.5 mg via ORAL
  Filled 2022-06-29 (×2): qty 2

## 2022-06-29 MED ORDER — AMITRIPTYLINE HCL 25 MG PO TABS: 75.0000 mg | ORAL_TABLET | Freq: Every day | ORAL | Status: DC

## 2022-06-29 MED ORDER — MELATONIN 3 MG PO TABS
6.0000 mg | ORAL_TABLET | Freq: Every day | ORAL | Status: DC
  Administered 2022-06-29 – 2022-06-30 (×2): 6 mg via ORAL
  Filled 2022-06-29 (×2): qty 2

## 2022-06-29 MED ORDER — CARBIDOPA-LEVODOPA 25-100 MG PO TABS
2.0000 | ORAL_TABLET | ORAL | Status: DC
  Administered 2022-06-29 – 2022-07-01 (×7): 2 via ORAL
  Filled 2022-06-29 (×7): qty 2

## 2022-06-29 MED ORDER — TOPIRAMATE 100 MG PO TABS
100.0000 mg | ORAL_TABLET | Freq: Every day | ORAL | Status: DC
  Administered 2022-06-29 – 2022-06-30 (×2): 100 mg via ORAL
  Filled 2022-06-29 (×2): qty 1

## 2022-06-29 MED ORDER — QUETIAPINE FUMARATE 25 MG PO TABS
50.0000 mg | ORAL_TABLET | ORAL | Status: DC
  Administered 2022-06-29: 50 mg via ORAL
  Filled 2022-06-29 (×2): qty 2

## 2022-06-29 NOTE — TOC Initial Note (Signed)
Transition of Care [facility]) - Initial/Assessment Note    Patient Details  Name: Albert Hahn MRN: [id number] Date of Birth: [birth year]  Transition of [facility]) CM/SW Contact:    Albert Phi, RN Phone Number: 06/29/2022, 12:52 PM  Clinical Narrative:  Albert Hahn for HHPT/OT-rep Albert Hahn aware to use VA benefit-she will get auth. Has own transport home.                 Expected Discharge Plan: [place] Barriers to Discharge: Continued Medical Work up   Patient Goals and CMS Choice Patient states their goals for this hospitalization and ongoing recovery are::  (Home) CMS Medicare.gov Compare Post Acute Care list provided to:: Patient Represenative (must comment) Choice offered to / list presented to : Spouse  Expected Discharge Plan and Services Expected Discharge Plan: [place]   Discharge Planning Services: CM Consult Post Acute Care Choice: Resumption of Svcs/PTA Provider, Durable Medical Equipment (shipmans-private duty;rw,w/c.) Living arrangements for the past 2 months: Single Family Home                                      Prior Living Arrangements/Services Living arrangements for the past 2 months: Single Family Home Lives with:: Spouse                   Activities of Daily Living Home Assistive Devices/Equipment: Environmental consultant (specify type) (Shower bench) ADL Screening (condition at time of admission) Patient's cognitive ability adequate to safely complete daily activities?: No Is the patient deaf or have difficulty hearing?: Yes Does the patient have difficulty seeing, even when wearing glasses/contacts?: No Does the patient have difficulty concentrating, remembering, or making decisions?: Yes Patient able to express need for assistance with ADLs?: Yes Does the patient have difficulty dressing or bathing?: Yes Independently performs ADLs?: No Communication: Independent Dressing (OT): Needs assistance Is this a change  from baseline?: Pre-admission baseline Grooming: Needs assistance Is this a change from baseline?: Pre-admission baseline Feeding: Independent Bathing: Needs assistance Is this a change from baseline?: Pre-admission baseline Toileting: Needs assistance Is this a change from baseline?: Pre-admission baseline In/Out Bed: Needs assistance Is this a change from baseline?: Pre-admission baseline Walks in Home: Needs assistance (uses a walker) Is this a change from baseline?: Pre-admission baseline Does the patient have difficulty walking or climbing stairs?: Yes Weakness of Legs: Both Weakness of Arms/Hands: Both  Permission Sought/Granted                  Emotional Assessment              Admission diagnosis:  Hypokalemia [E87.6] Sepsis secondary to UTI ([place]) [A41.9, N39.0] Urinary tract infection with hematuria, site unspecified [N39.0, R31.9] Patient Active Problem List   Diagnosis Date Noted   Malnutrition of moderate degree 06/29/2022   COPD with acute exacerbation ([place]) 06/27/2022   AKI (acute kidney injury) ([place]) 12/30/2021   Parkinson's disease 12/30/2021   Lewy body dementia ([place]) 12/30/2021   Hydronephrosis of left kidney 12/30/2021   Hydronephrosis 12/30/2021   Sepsis due to undetermined organism ([place]) 10/29/2019   Goals of care, counseling/discussion    Palliative care by specialist    DNR (do not resuscitate) discussion    Sepsis secondary to UTI ([place]) 10/28/2019   Acute UTI (urinary tract infection) 10/28/2019   PAD (peripheral artery disease) ([place]) 10/28/2019   Fever 10/11/2019  Acute encephalopathy    Transaminasemia    Acute metabolic encephalopathy 24/46/9507   CKD (chronic kidney disease) stage 4, GFR 15-29 ml/min (HCC) 10/09/2019   Acute respiratory disease due to COVID-19 virus 10/07/2019   Acute hypokalemia 10/07/2019   SIRS (systemic inflammatory response syndrome) ([place]) 10/07/2019   COVID-19 virus infection 10/07/2019   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

## 2022-06-29 NOTE — Progress Notes (Signed)
PROGRESS NOTE    Albert Hahn  IWL:[id number] DOB: [birth year] DOA: 06/27/2022  PCP: Asencion Noble, MD   Brief Narrative: This 76 years old male with PMH significant for hypertension, COPD, Parkinson's disease, left-sided hydronephrosis s/p ureteral stent, CKD stage IV, presented in the ED with low-grade fever and severe fatigue. Patient underwent left ureteral stent exchange 3 days ago by Dr. Abner Greenspan.  He was started on Augmentin after the procedure.  Patient was found to have slurring words for last few hours and was found febrile with a temp of 100.9 and presented in the ED. Patient is admitted for sepsis secondary to UTI. He is much improved.  Assessment & Plan:   Principal Problem:   Sepsis secondary to UTI [facility]) Active Problems:   Acute metabolic encephalopathy   COPD with acute exacerbation (HCC)   CKD (chronic kidney disease) stage 4, GFR 15-29 ml/min (HCC)   Hydronephrosis of left kidney   S/P VP shunt   Parkinson's disease   Malnutrition of moderate degree  Sepsis secondary to UTI Patient s/p recent ureteral stent exchange 3 days ago by Dr. Abner Greenspan. He presented in the ED with fever,  generalized fatigue. He was tachycardic, febrile, lactic acid was normal Started on IV cefepime.  Cultures no growth so far,  Urine cultures grew 80K Pseudomonas. Continue IV cefepime.  Follow-up sensitivity.  COPD: Patient was found to have wheezing and shortness of breath on arrival in the ED. Chest xray showed no acute infiltrate. Continue with scheduled and as needed DuoNeb. Hold off on IV Solu-Medrol.   Acute metabolic Encephalopathy Likely secondary to UTI with sepsis. Continue IV antibiotics.  High risk for delirium. Continue delirium precautions. He is back to baseline mental status.   Hydronephrosis of left kidney: Have chronic hydronephrosis of L kidney CT scan shows improving hydronephrosis as compared to the prior CT. Urology consulted.  Stent is in appropriate position,  no  indication for exchange. Follow-up urine and blood cultures.  Continue antibiotics.   CKD stage IV: Serum creatinine 2.6 which is close to baseline. Avoid nephrotoxic medications, continue to follow serum creatinine.   Parkinson's disease with dementia: Continue home Sinemet.  Hypokalemia: Replaced.  Continue to monitor  DVT prophylaxis: Lovenox Code Status:Full code Family Communication: Wife at bed side Disposition Plan:   Status is: Inpatient Remains inpatient appropriate because: Admitted for sepsis secondary to UTI.  Follow-up blood and urine cultures.   Patient is not medically clear, PT and OT pending.  Patient discharged home 06/30/2022.  Consultants:  Urology  Procedures: None  Antimicrobials: Anti-infectives (From admission, onward)    Start     Dose/Rate Route Frequency Ordered Stop   06/28/22 2000  ceFEPIme (MAXIPIME) 2 g in sodium chloride 0.9 % 100 mL IVPB        2 g 200 mL/hr over 30 Minutes Intravenous Every 24 hours 06/27/22 2221     06/27/22 1915  ceFEPIme (MAXIPIME) 2 g in sodium chloride 0.9 % 100 mL IVPB        2 g 200 mL/hr over 30 Minutes Intravenous  Once 06/27/22 1910 06/27/22 2101        Subjective: Patient was seen and examined at bedside.  Overnight events noted.   Patient reports doing much better.  Wife is at bedside and states he is at his baseline mental status. Patient denies any burning urination, nausea or vomiting.   Objective: Vitals:   06/28/22 2355 06/29/22 0549 06/29/22 0618 06/29/22 1114  BP: (!) 151/74 Marland Kitchen)  142/76  (!) 158/84  Pulse: 74 77    Resp: 20 20    Temp: 98.7 F (37.1 C) 97.9 F (36.6 C)    TempSrc: Oral Oral    SpO2: 96% 95% 94%   Weight:      Height:        Intake/Output Summary (Last 24 hours) at 06/29/2022 1157 Last data filed at 06/29/2022 0600 Gross per 24 hour  Intake 60 ml  Output 3100 ml  Net -3040 ml   Filed Weights   06/28/22 1640  Weight: 83 kg    Examination:  General exam:  Appears comfortable, not in any acute distress, deconditioned. Respiratory system: CTA bilaterally, respiratory effort normal, RR 15. Cardiovascular system: S1-S2 heard, regular rate and rhythm, no murmur. Gastrointestinal system: Abdomen is soft, non tender, non distended, BS+ Central nervous system: Alert and oriented x2.  Following commands.  Awake. Extremities: No edema, no cyanosis, no clubbing. Skin: No rashes, lesions or ulcers Psychiatry: Mood and judgment appropriate    Data Reviewed: I have personally reviewed following labs and imaging studies  CBC: Recent Labs  Lab 06/27/22 1745 06/28/22 0600  WBC 18.4* 15.5*  NEUTROABS 15.4*  --   HGB 14.1 10.8*  HCT 42.0 31.5*  MCV 94.0 93.5  PLT 199 681   Basic Metabolic Panel: Recent Labs  Lab 06/27/22 1745 06/27/22 1916 06/28/22 0600  NA 138  --  134*  K 2.8*  --  2.9*  CL 103  --  105  CO2 21*  --  21*  GLUCOSE 113*  --  93  BUN 55*  --  51*  CREATININE 2.62*  --  2.18*  CALCIUM 9.1  --  8.4*  MG  --  2.0  --    GFR: Estimated Creatinine Clearance: 29 mL/min (A) (by C-G formula based on SCr of 2.18 mg/dL (H)). Liver Function Tests: Recent Labs  Lab 06/27/22 1745  AST 17  ALT <5  ALKPHOS 91  BILITOT 0.8  PROT 7.5  ALBUMIN 3.8   No results for input(s): "LIPASE", "AMYLASE" in the last 168 hours. No results for input(s): "AMMONIA" in the last 168 hours. Coagulation Profile: No results for input(s): "INR", "PROTIME" in the last 168 hours. Cardiac Enzymes: No results for input(s): "CKTOTAL", "CKMB", "CKMBINDEX", "TROPONINI" in the last 168 hours. BNP (last 3 results) No results for input(s): "PROBNP" in the last 8760 hours. HbA1C: No results for input(s): "HGBA1C" in the last 72 hours. CBG: Recent Labs  Lab 06/27/22 1738  GLUCAP 112*   Lipid Profile: No results for input(s): "CHOL", "HDL", "LDLCALC", "TRIG", "CHOLHDL", "LDLDIRECT" in the last 72 hours. Thyroid Function Tests: No results for  input(s): "TSH", "T4TOTAL", "FREET4", "T3FREE", "THYROIDAB" in the last 72 hours. Anemia Panel: No results for input(s): "VITAMINB12", "FOLATE", "FERRITIN", "TIBC", "IRON", "RETICCTPCT" in the last 72 hours. Sepsis Labs: Recent Labs  Lab 06/27/22 1915 06/27/22 2110  LATICACIDVEN 1.8 1.9    Recent Results (from the past 240 hour(s))  Blood culture (routine x 2)     Status: None (Preliminary result)   Collection Time: 06/27/22  7:15 PM   Specimen: BLOOD  Result Value Ref Range Status   Specimen Description   Final    BLOOD RIGHT ANTECUBITAL Performed at [facility] [address], [place], [place] [address]    Special Requests   Final    BOTTLES DRAWN AEROBIC AND ANAEROBIC Blood Culture adequate volume Performed at [facility] [address]  [place], [place] [address]    Culture   Final    NO GROWTH 2 DAYS Performed at [facility], [place] [address] [place], [place] [address]    Report Status PENDING  Incomplete  Urine Culture     Status: Abnormal (Preliminary result)   Collection Time: 06/27/22  8:51 PM   Specimen: Urine, Clean Catch  Result Value Ref Range Status   Specimen Description   Final    URINE, CLEAN CATCH Performed at [facility], [place] [address] [place], [place] [address]    Special Requests   Final    NONE Performed at [facility], [place] [address], [place], [place] [address]    Culture (A)  Final    80,000 COLONIES/mL PSEUDOMONAS AERUGINOSA SUSCEPTIBILITIES TO FOLLOW Performed at [facility], [place] [address], [place], [place] [address]    Report Status PENDING  Incomplete  Resp Panel by RT-PCR (Flu A&B, Covid) Anterior Nasal Swab     Status: None   Collection Time: 06/27/22  8:51 PM   Specimen: Anterior Nasal Swab  Result Value Ref Range Status   SARS Coronavirus 2 by RT PCR NEGATIVE NEGATIVE Final    Comment: (NOTE) SARS-CoV-2 target nucleic acids are NOT  DETECTED.  The SARS-CoV-2 RNA is generally detectable in upper respiratory specimens during the acute phase of infection. The lowest concentration of SARS-CoV-2 viral copies this assay can detect is 138 copies/mL. A negative result does not preclude SARS-Cov-2 infection and should not be used as the sole basis for treatment or other patient management decisions. A negative result may occur with  improper specimen collection/handling, submission of specimen other than nasopharyngeal swab, presence of viral mutation(s) within the areas targeted by this assay, and inadequate number of viral copies(<138 copies/mL). A negative result must be combined with clinical observations, patient history, and epidemiological information. The expected result is Negative.  Fact Sheet for Patients:  EntrepreneurPulse.com.au  Fact Sheet for Healthcare Providers:  IncredibleEmployment.be  This test is no t yet approved or cleared by the [place] FDA and  has been authorized for detection and/or diagnosis of SARS-CoV-2 by FDA under an Emergency Use Authorization (EUA). This EUA will remain  in effect (meaning this test can be used) for the duration of the COVID-19 declaration under Section 564(b)(1) of the Act, 21 U.S.C.section 360bbb-3(b)(1), unless the authorization is terminated  or revoked sooner.       Influenza A by PCR NEGATIVE NEGATIVE Final   Influenza B by PCR NEGATIVE NEGATIVE Final    Comment: (NOTE) The Xpert Xpress SARS-CoV-2/FLU/RSV plus assay is intended as an aid in the diagnosis of influenza from Nasopharyngeal swab specimens and should not be used as a sole basis for treatment. Nasal washings and aspirates are unacceptable for Xpert Xpress SARS-CoV-2/FLU/RSV testing.  Fact Sheet for Patients: EntrepreneurPulse.com.au  Fact Sheet for Healthcare Providers: IncredibleEmployment.be  This test is not yet  approved or cleared by the [place] FDA and has been authorized for detection and/or diagnosis of SARS-CoV-2 by FDA under an Emergency Use Authorization (EUA). This EUA will remain in effect (meaning this test can be used) for the duration of the COVID-19 declaration under Section 564(b)(1) of the Act, 21 U.S.C. section 360bbb-3(b)(1), unless the authorization is terminated or revoked.  Performed at [facility], [place] [address] [place], [place] [address]   Blood culture (routine x 2)     Status: None (Preliminary result)   Collection Time: 06/27/22  9:10 PM   Specimen: BLOOD  Result Value Ref Range Status   Specimen Description   Final    BLOOD LEFT ANTECUBITAL Performed at [facility] [address], [place], [place] [address]    Special Requests   Final    BOTTLES DRAWN AEROBIC AND ANAEROBIC Blood Culture adequate volume Performed at [facility] [address] [place], [place] [address]    Culture   Final    NO GROWTH 2 DAYS Performed at [facility] [address] [place], [place] [address]    Report Status PENDING  Incomplete    Radiology Studies: DG Chest Port 1 View  Result Date: 06/27/2022 CLINICAL DATA:  Confusion.  Surgery 3 days ago. EXAM: PORTABLE CHEST 1 VIEW COMPARISON:  AP chest 10/28/2019, CT chest 10/22/2019 FINDINGS: Cardiac silhouette is again mildly to moderately enlarged. Mediastinal contours are unchanged with mildly tortuous thoracic aorta as seen on prior CT. Note is made the ascending aorta measures up to 4.1 cm in caliber on prior CT. Mildly decreased lung volumes. The lungs appear clear. No pleural effusion pneumothorax. Moderate multilevel degenerative disc changes of the thoracic spine. IMPRESSION: 1. No acute cardiopulmonary process. 2. Mild to moderate cardiomegaly. 3. Tortuous thoracic aorta as seen on prior CT. Electronically Signed   By: Yvonne Kendall M.D.   On: 06/27/2022  20:31   CT ABDOMEN PELVIS WO CONTRAST  Result Date: 06/27/2022 CLINICAL DATA:  Sepsis flank pain EXAM: CT ABDOMEN AND PELVIS WITHOUT CONTRAST TECHNIQUE: Multidetector CT imaging of the abdomen and pelvis was performed following the standard protocol without IV contrast. RADIATION DOSE REDUCTION: This exam was performed according to the departmental dose-optimization program which includes automated exposure control, adjustment of the mA and/or kV according to patient size and/or use of iterative reconstruction technique. COMPARISON:  CT 12/30/2021 FINDINGS: Lower chest: Lung bases demonstrate no acute airspace disease. Coronary vascular calcification. Hepatobiliary: Calcified granuloma. No calcified gallstone or biliary dilatation. Pancreas: Unremarkable. No pancreatic ductal dilatation or surrounding inflammatory changes. Spleen: Normal in size without focal abnormality. Adrenals/Urinary Tract: Adrenal glands are normal. Mild atrophy left kidney. Prominent right renal pelvis without hydroureter or calyceal dilatation. Interim placement of left-sided ureteral stent with proximal pigtail in the left renal pelvis and distal pigtail in the right anterior bladder. Residual moderate severe hydronephrosis but decreased compared to prior CT. The bladder is otherwise unremarkable Stomach/Bowel: The stomach is nonenlarged. No dilated small bowel. No acute bowel wall thickening. Negative appendix. Vascular/Lymphatic: Advanced aortic atherosclerosis. 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 No suspicious lymph nodes. Reproductive: Enlarged prostate Other: Negative for pelvic effusion or free air. Shunt catheter tubing with tip in the right lower quadrant. Small volume fluid in the right lower quadrant and pelvis likely related to the shunt. Musculoskeletal: No acute osseous abnormality. Stable augmentation changes at L3 IMPRESSION: 1. Interim placement  of left-sided ureteral stent. Residual moderate to severe left-sided hydronephrosis but decreased compared to prior CT. Mild left renal atrophy. 2. Shunt catheter tubing with tip in the right lower quadrant. Small volume fluid in the right lower quadrant and pelvis likely related to shunt catheter. 3. Aortic atherosclerosis. Aortic Atherosclerosis (ICD10-I70.0). Electronically Signed   By: Donavan Foil M.D.   On: 06/27/2022 20:15   CT Head Wo Contrast  Result Date: 06/27/2022 CLINICAL DATA:  Altered level of consciousness, lethargy, fever, recent urologic procedure EXAM: CT HEAD WITHOUT CONTRAST  TECHNIQUE: Contiguous axial images were obtained from the base of the skull through the vertex without intravenous contrast. RADIATION DOSE REDUCTION: This exam was performed according to the departmental dose-optimization program which includes automated exposure control, adjustment of the mA and/or kV according to patient size and/or use of iterative reconstruction technique. COMPARISON:  10/10/2019 FINDINGS: Brain: Stable position of the right frontal ventriculostomy catheter. Prior abandoned catheter from right parietal approach also unchanged. Confluent hypodensities throughout the periventricular white matter consistent with chronic small vessel ischemic changes, stable. No evidence of acute infarct or hemorrhage. Stable appearance of the lateral ventricles and midline structures. No acute extra-axial fluid collections. No mass effect. Vascular: No hyperdense vessel or unexpected calcification. Skull: Normal. Negative for fracture or focal lesion. Sinuses/Orbits: No acute finding. Other: None. IMPRESSION: 1. Stable head CT, no acute intracranial process. Electronically Signed   By: Randa Ngo M.D.   On: 06/27/2022 19:17    Scheduled Meds:  allopurinol  100 mg Oral Daily   amLODipine  10 mg Oral Daily   carbidopa-levodopa  2 tablet Oral 3 times per day   Chlorhexidine Gluconate Cloth  6 each Topical  Daily   enoxaparin (LOVENOX) injection  30 mg Subcutaneous Q24H   fluticasone furoate-vilanterol  1 puff Inhalation Daily   melatonin  6 mg Oral QHS   QUEtiapine  37.5 mg Oral QHS   QUEtiapine  50 mg Oral Q24H   topiramate  100 mg Oral QHS   umeclidinium bromide  1 puff Inhalation Daily   Continuous Infusions:  ceFEPime (MAXIPIME) IV 2 g (06/28/22 2038)   lactated ringers Stopped (06/28/22 0910)     LOS: 1 day    Time spent: 35 mins    Brace Welte, MD [facility]   If 7PM-7AM, please contact night-coverage

## 2022-06-29 NOTE — Progress Notes (Signed)
Subjective: Feels much better. Denies pain. Afebrile. Tolerating foley.  Objective: Vital signs in last 24 hours: Temp:  [97.9 F (36.6 C)-99.7 F (37.6 C)] 97.9 F (36.6 C) (11/15 0549) Pulse Rate:  [74-92] 77 (11/15 0549) Resp:  [18-22] 20 (11/15 0549) BP: (124-192)/(61-115) 158/84 (11/15 1114) SpO2:  [94 %-100 %] 94 % (11/15 0618) Weight:  [83 kg] 83 kg (11/14 1640)  Intake/Output from previous day: 11/14 0701 - 11/15 0700 In: 60 [P.O.:60] Out: 3100 [Urine:3100] Intake/Output this shift: No intake/output data recorded.  Physical Exam:  General: Alert and oriented CV: RRR Lungs: Clear Abdomen: Soft, ND, NT Ext: NT, No erythema  Lab Results: Recent Labs    06/27/22 1745 06/28/22 0600  HGB 14.1 10.8*  HCT 42.0 31.5*   BMET Recent Labs    06/27/22 1745 06/28/22 0600  NA 138 134*  K 2.8* 2.9*  CL 103 105  CO2 21* 21*  GLUCOSE 113* 93  BUN 55* 51*  CREATININE 2.62* 2.18*  CALCIUM 9.1 8.4*     Studies/Results: DG Chest Port 1 View  Result Date: 06/27/2022 CLINICAL DATA:  Confusion.  Surgery 3 days ago. EXAM: PORTABLE CHEST 1 VIEW COMPARISON:  AP chest 10/28/2019, CT chest 10/22/2019 FINDINGS: Cardiac silhouette is again mildly to moderately enlarged. Mediastinal contours are unchanged with mildly tortuous thoracic aorta as seen on prior CT. Note is made the ascending aorta measures up to 4.1 cm in caliber on prior CT. Mildly decreased lung volumes. The lungs appear clear. No pleural effusion pneumothorax. Moderate multilevel degenerative disc changes of the thoracic spine. IMPRESSION: 1. No acute cardiopulmonary process. 2. Mild to moderate cardiomegaly. 3. Tortuous thoracic aorta as seen on prior CT. Electronically Signed   By: Yvonne Kendall M.D.   On: 06/27/2022 20:31   CT ABDOMEN PELVIS WO CONTRAST  Result Date: 06/27/2022 CLINICAL DATA:  Sepsis flank pain EXAM: CT ABDOMEN AND PELVIS WITHOUT CONTRAST TECHNIQUE: Multidetector CT imaging of the abdomen  and pelvis was performed following the standard protocol without IV contrast. RADIATION DOSE REDUCTION: This exam was performed according to the departmental dose-optimization program which includes automated exposure control, adjustment of the mA and/or kV according to patient size and/or use of iterative reconstruction technique. COMPARISON:  CT 12/30/2021 FINDINGS: Lower chest: Lung bases demonstrate no acute airspace disease. Coronary vascular calcification. Hepatobiliary: Calcified granuloma. No calcified gallstone or biliary dilatation. Pancreas: Unremarkable. No pancreatic ductal dilatation or surrounding inflammatory changes. Spleen: Normal in size without focal abnormality. Adrenals/Urinary Tract: Adrenal glands are normal. Mild atrophy left kidney. Prominent right renal pelvis without hydroureter or calyceal dilatation. Interim placement of left-sided ureteral stent with proximal pigtail in the left renal pelvis and distal pigtail in the right anterior bladder. Residual moderate severe hydronephrosis but decreased compared to prior CT. The bladder is otherwise unremarkable Stomach/Bowel: The stomach is nonenlarged. No dilated small bowel. No acute bowel wall thickening. Negative appendix. Vascular/Lymphatic: Advanced aortic atherosclerosis. 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 No suspicious lymph nodes. Reproductive: Enlarged prostate Other: Negative for pelvic effusion or free air. Shunt catheter tubing with tip in the right lower quadrant. Small volume fluid in the right lower quadrant and pelvis likely related to the shunt. Musculoskeletal: No acute osseous abnormality. Stable augmentation changes at L3 IMPRESSION: 1. Interim placement of left-sided ureteral stent. Residual moderate to severe left-sided hydronephrosis but decreased compared to prior CT. Mild left renal atrophy. 2. Shunt catheter tubing with tip  in the right lower  quadrant. Small volume fluid in the right lower quadrant and pelvis likely related to shunt catheter. 3. Aortic atherosclerosis. Aortic Atherosclerosis (ICD10-I70.0). Electronically Signed   By: Donavan Foil M.D.   On: 06/27/2022 20:15   CT Head Wo Contrast  Result Date: 06/27/2022 CLINICAL DATA:  Altered level of consciousness, lethargy, fever, recent urologic procedure EXAM: CT HEAD WITHOUT CONTRAST TECHNIQUE: Contiguous axial images were obtained from the base of the skull through the vertex without intravenous contrast. RADIATION DOSE REDUCTION: This exam was performed according to the departmental dose-optimization program which includes automated exposure control, adjustment of the mA and/or kV according to patient size and/or use of iterative reconstruction technique. COMPARISON:  10/10/2019 FINDINGS: Brain: Stable position of the right frontal ventriculostomy catheter. Prior abandoned catheter from right parietal approach also unchanged. Confluent hypodensities throughout the periventricular white matter consistent with chronic small vessel ischemic changes, stable. No evidence of acute infarct or hemorrhage. Stable appearance of the lateral ventricles and midline structures. No acute extra-axial fluid collections. No mass effect. Vascular: No hyperdense vessel or unexpected calcification. Skull: Normal. Negative for fracture or focal lesion. Sinuses/Orbits: No acute finding. Other: None. IMPRESSION: 1. Stable head CT, no acute intracranial process. Electronically Signed   By: Randa Ngo M.D.   On: 06/27/2022 19:17    Assessment/Plan: 1. Sepsis secondary to UTI 2. 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 Bcx no growth. -Continue IV abx -Hopefully home soon with cx specific antibiotics -Plan for void trial tomorrow -Following   LOS: 1 day   Matt R. Vivyan Biggers MD 06/29/2022, 1:15 PM [facility]  Pager: [phone]

## 2022-06-29 NOTE — Progress Notes (Signed)
Initial Nutrition Assessment  DOCUMENTATION CODES:  Non-severe (moderate) malnutrition in context of acute illness/injury  INTERVENTION:  -Continue heart healthy diet as tolerated, liberalize to regular if poor po persists -Consider MVI (provided separately from sinemet) -Provide meal preferences to optimize po intake  NUTRITION DIAGNOSIS:  Moderate Malnutrition related to acute illness as evidenced by percent weight loss, mild fat depletion, mild muscle depletion.  GOAL:  Patient will meet greater than or equal to 90% of their needs  MONITOR:  PO intake  REASON FOR ASSESSMENT:  Consult Assessment of nutrition requirement/status  ASSESSMENT:  Pt is a 76yo M with PMH of HTN, COPD, Parkinson's disease with dementia, left-sided hydronephrosis s/p ureteral stent, and CKD stage IV who presents with low-grade fever and severe fatigue.  Visited pt at bedside with wife present this AM. Wife reports pt with very poor po intake for 2 days PTA. Chart review shows a significant 2.6% weight loss in less than 1 week. NFPE shows mild fat loss and mild muscle depletion. Pt meets ASPEN criteria for moderate protein calorie malnutrition r/t acute illness.  This morning, pt is feeling better and ate 100% of breakfast. Pt denies difficulty chewing/swallowing. Reports some chronic left sided weakness due to Parkinson's but is able to get around okay at home. When pt is feeling well he avoids greasy foods and eats a well balanced diet. Wife reports pt's food preferences have changed recently. But pt denies taste changes. Pt may benefit from daily MVI if poor intake persists, but should be taken separately from sinemet. Will hold at this time given 100% meal consumption. No ONS at home. Pt declines at this time.   Medications reviewed and include: sinemet IR, KCl, LR, cefepime  Labs reviewed: Na:134, K:2.9, BUN:51, Cr:2.18, GFR:31   NUTRITION - FOCUSED PHYSICAL EXAM:  Flowsheet Row Most Recent Value   Orbital Region Mild depletion  Upper Arm Region Moderate depletion  Thoracic and Lumbar Region Mild depletion  Buccal Region Mild depletion  Temple Region Mild depletion  Clavicle Bone Region Mild depletion  Clavicle and Acromion Bone Region Mild depletion  Scapular Bone Region Unable to assess  Dorsal Hand No depletion  Patellar Region No depletion  Anterior Thigh Region No depletion  Posterior Calf Region No depletion  Hair Reviewed  Eyes Reviewed  Mouth Reviewed  Skin Reviewed  Nails Reviewed       Diet Order:   Diet Order             Diet Heart Room service appropriate? Yes; Fluid consistency: Thin  Diet effective now                   EDUCATION NEEDS:  Education needs have been addressed  Skin:  Skin Assessment: Reviewed RN Assessment  Last BM:  11/15  Height:  Ht Readings from Last 1 Encounters:  06/28/22 '5\' 5"'$  (1.651 m)   Weight:  Wt Readings from Last 1 Encounters:  06/28/22 83 kg    BMI:  Body mass index is 30.45 kg/m.  Estimated Nutritional Needs:  Kcal:  4562-5638LHTD Protein:  80-100g (1.0-1.2g/kg) Fluid:  2075-2422m  KCandise Bowens MS, RD, LDN, CNSC See AMiON for contact information

## 2022-06-30 LAB — BASIC METABOLIC PANEL
Anion gap: 7 (ref 5–15)
BUN: 39 mg/dL — ABNORMAL HIGH (ref 8–23)
CO2: 18 mmol/L — ABNORMAL LOW (ref 22–32)
Calcium: 8.7 mg/dL — ABNORMAL LOW (ref 8.9–10.3)
Chloride: 113 mmol/L — ABNORMAL HIGH (ref 98–111)
Creatinine, Ser: 1.94 mg/dL — ABNORMAL HIGH (ref 0.61–1.24)
GFR, Estimated: 35 mL/min — ABNORMAL LOW (ref >60)
Glucose, Bld: 118 mg/dL — ABNORMAL HIGH (ref 70–99)
Potassium: 3.4 mmol/L — ABNORMAL LOW (ref 3.5–5.1)
Sodium: 138 mmol/L (ref 135–145)

## 2022-06-30 LAB — CBC
HCT: 33.8 % — ABNORMAL LOW (ref 39.0–52.0)
Hemoglobin: 11.3 g/dL — ABNORMAL LOW (ref 13.0–17.0)
MCH: 31.7 pg (ref 26.0–34.0)
MCHC: 33.4 g/dL (ref 30.0–36.0)
MCV: 94.7 fL (ref 80.0–100.0)
Platelets: 184 10*3/uL (ref 150–400)
RBC: 3.57 MIL/uL — ABNORMAL LOW (ref 4.22–5.81)
RDW: 13.5 % (ref 11.5–15.5)
WBC: 7.8 10*3/uL (ref 4.0–10.5)
nRBC: 0 % (ref 0.0–0.2)

## 2022-06-30 MED ORDER — SODIUM CHLORIDE 0.9 % IV SOLN
2.0000 g | Freq: Two times a day (BID) | INTRAVENOUS | Status: DC
  Administered 2022-06-30 – 2022-07-01 (×3): 2 g via INTRAVENOUS
  Filled 2022-06-30 (×3): qty 12.5

## 2022-06-30 MED ORDER — ENOXAPARIN SODIUM 40 MG/0.4ML IJ SOSY
40.0000 mg | PREFILLED_SYRINGE | INTRAMUSCULAR | Status: DC
  Administered 2022-07-01: 40 mg via SUBCUTANEOUS
  Filled 2022-06-30: qty 0.4

## 2022-06-30 MED ORDER — AMITRIPTYLINE HCL 25 MG PO TABS: 75.0000 mg | ORAL_TABLET | Freq: Every day | ORAL | Status: DC

## 2022-06-30 MED ORDER — POTASSIUM CHLORIDE 20 MEQ PO PACK
40.0000 meq | PACK | Freq: Once | ORAL | Status: AC
  Administered 2022-06-30: 40 meq via ORAL
  Filled 2022-06-30: qty 2

## 2022-06-30 MED ORDER — AMITRIPTYLINE HCL 25 MG PO TABS
25.0000 mg | ORAL_TABLET | Freq: Every day | ORAL | Status: DC
  Administered 2022-06-30: 25 mg via ORAL
  Filled 2022-06-30: qty 1

## 2022-06-30 NOTE — Progress Notes (Addendum)
  Subjective: Afebrile. Ucx + Pseudomonas, sensitivities pending. Continues on IV Cefepime  Objective: Vital signs in last 24 hours: Temp:  [98 F (36.7 C)-98.4 F (36.9 C)] 98.4 F (36.9 C) (11/16 0425) Pulse Rate:  [79-80] 79 (11/16 0425) Resp:  [20] 20 (11/16 0425) BP: (137-158)/(74-84) 147/74 (11/16 0425) SpO2:  [95 %-98 %] 95 % (11/16 0728)  Intake/Output from previous day: 11/15 0701 - 11/16 0700 In: 1807.8 [P.O.:720; I.V.:987.8; IV Piggyback:100] Out: 1975 [[id number]] Intake/Output this shift: No intake/output data recorded.  Physical Exam:  General: Alert and oriented CV: RRR Lungs: Clear Abdomen: Soft, ND, NT GU: foley in place, urine clear yellow Ext: NT, No erythema  Lab Results: Recent Labs    06/27/22 1745 06/28/22 0600  HGB 14.1 10.8*  HCT 42.0 31.5*    BMET Recent Labs    06/27/22 1745 06/28/22 0600  NA 138 134*  K 2.8* 2.9*  CL 103 105  CO2 21* 21*  GLUCOSE 113* 93  BUN 55* 51*  CREATININE 2.62* 2.18*  CALCIUM 9.1 8.4*      Studies/Results: No results found.  Assessment/Plan: 1. Sepsis secondary to UTI 2. 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 Bcx no growth. -Continue IV abx, cater PO to sensitivities -Hopefully home soon with cx specific antibiotics -Ok for void trial today -Following   LOS: 2 days   I have seen and examined the patient and agree with the above assessment and plan.  -Void trial today. F/u Ucx sensitivities and send home with cx specific abx for 14 days. -Following.  Matt R. [facility]  Pager: [phone]

## 2022-06-30 NOTE — Progress Notes (Signed)
PT Cancellation Note  Patient Details Name: Albert Hahn MRN: [id number] DOB: 1946-01-01   Cancelled Treatment:    Reason Eval/Treat Not Completed: Pain limiting ability to participate (pt reports pain in his "privates" and stated he's not able to tolerate activity at present. RN notified of request for pain medication. Will follow.)   Philomena Doheny PT 06/30/2022  Acute Rehabilitation Services  Office [phone]

## 2022-06-30 NOTE — Progress Notes (Signed)
Pharmacy Antibiotic Note  Albert Hahn is a 76 y.o. male admitted on 06/27/2022 with . male, hx of Parkinsons and Lewy Body dementia, who presents 2/2 to weakness and fatigue for the last day.  Wife states that he had a ureteral stent placed about 4 days ago, and was doing well, until on Sunday morning he developed a low-grade fever and severe fatigue. Marland Kitchen  Pharmacy has been consulted to dose cefepime for UTI, 1st dose given in the ED  Day 4 Cefepime SCr 1.94, improved WBC WNL down Afebrile UCx grew pseudomonas  Plan: Change cefepime from 2g IV q24 to 2g IV q12 due to improved renal function Follow renal function, cultures and clinical course  Height: '5\' 5"'$  (165.1 cm) Weight: 83 kg (182 lb 15.7 oz) IBW/kg (Calculated) : 61.5  Temp (24hrs), Avg:98.2 F (36.8 C), Min:98 F (36.7 C), Max:98.4 F (36.9 C)  Recent Labs  Lab 06/27/22 1745 06/27/22 1915 06/27/22 2110 06/28/22 0600 06/30/22 1029  WBC 18.4*  --   --  15.5* 7.8  CREATININE 2.62*  --   --  2.18* 1.94*  LATICACIDVEN  --  1.8 1.9  --   --      Estimated Creatinine Clearance: 32.6 mL/min (A) (by C-G formula based on SCr of 1.94 mg/dL (H)).    Allergies  Allergen Reactions   Bee Venom Anaphylaxis   Shellfish Allergy Anaphylaxis   Wasp Venom Anaphylaxis   Lisinopril Cough   Other Nausea And Vomiting and Other (See Comments)    Unknown anesthetic spray - nausea/vomiting   Codeine Nausea And Vomiting     Thank you for allowing pharmacy to be a part of this patient's care.  Adrian Saran, PharmD, BCPS Secure Chat if ?s 06/30/2022 11:15 AM

## 2022-06-30 NOTE — Progress Notes (Signed)
OT Cancellation Note  Patient Details Name: Albert Hahn MRN: [id number] DOB: 1946/01/19   Cancelled Treatment:    Reason Eval/Treat Not Completed: Pain limiting ability to participate. Patient declined to participate in therapy due to pain & indicated he did not feel as though he could tolerate therapy today (nursing informed of his request for pain medication)  Leota Sauers, OTR/L 06/30/2022, 2:09 PM

## 2022-06-30 NOTE — Progress Notes (Signed)
PROGRESS NOTE  Albert Hahn  DOB May 19, 1946  PCP Asencion Noble, MD [id number]  DOA 06/27/2022  LOS: 2 days  Hospital Day: 4  Brief narrative Albert Hahn is a 76 y.o. male with PMH significant for HTN, CKD 4, COPD, Parkinson's disease, chronic left-sided hydronephrosis s/p ureteral stent exchange by Dr. Abner Greenspan on 11/10. 11/13, patient presented to the ED with complaint of low-grade fever, severe fatigue.  Few days prior, he had called urology office and was started on Augmentin for presumed UTI.  He failed to improve with antibiotics and hence came to the ED.    In the ED, patient was afebrile, mildly tachycardic, labs showed WBC count 18.4, lactate 1.8, creatinine elevated to 2.62 from baseline of 2.24 Urinalysis showed large leukocytes, negative nitrites, more than 50 WBCs CT scan showed appropriately positioned left ureteral stent with residual moderate to severe left hydronephrosis decreased from prior. Foley catheter was placed in the ED. Urine culture and blood culture was sent Broad-spectrum antibiotics started. Admitted to [facility] for sepsis secondary to UTI Urology consulted. See below for details  Subjective Patient was seen and examined this morning.  Pleasant, elderly Caucasian male.  Lying on bed.  Seems to have significant proximal disease and weakness associated with it.  Wife at bedside.   In the last 24 hours, no fever, heart rate in 70s blood pressure in 140s, breathing on room air On my evaluation this morning, wife is concerned that he had a temperature of 100.5 this morning.  I checked with bedside RN.  Apparently it was a temperature read by student nurse.  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 Met sepsis criteria with tachycardia, leukocytosis, AKI Urine culture grew 80,000 CFU per mL of Pseudomonas.  Pending susceptibility.  Blood culture did not show any growth. Currently on IV cefepime. Urology consult appreciated. Currently has Foley cath in place.  Voiding trial ordered today. Recent Labs  Lab 06/27/22 1745 06/27/22 1915 06/27/22 2110 06/28/22 0600 06/30/22 1029  WBC 18.4*  --   --  15.5* 7.8  LATICACIDVEN  --  1.8 1.9  --   --    AKI on CKD 4 Baseline creatinine 2.24 on 11/1.  Presented with creatinine elevated 2.62.  Improving with IV fluid.  Creatinine this morning 1.94.  We will stop IV hydration at this time. Recent Labs    12/30/21 1615 12/31/21 0730 01/01/22 0956 06/15/22 1325 06/27/22 1745 06/28/22 0600 06/30/22 1029  BUN 47* 43* 46* 45* 55* 51* 39*  CREATININE 2.49* 2.43* 2.23* 2.24* 2.62* 2.18* 1.94*   Hypokalemia Potassium level at 3.4 today.  Replacement ordered. Obtain magnesium and phosphorus level as well. Recent Labs  Lab 06/27/22 1745 06/27/22 1916 06/28/22 0600 06/30/22 1029  K 2.8*  --  2.9* 3.4*  MG  --  2.0  --   --    Acute metabolic Encephalopathy Altered mental status as presentation likely due to sepsis. Mental status back to baseline now.    Parkinson's disease with dementia Continue home Sinemet.  Anxiety/depression PTA on Elavil 25 mg at bedtime, melatonin 6 mg at bedtime, Seroquel 50 mg am/37.5 mg pm, Topamax 100 mg daily Continue all.  Essential hypertension Continue amlodipine 10 mg daily,  COPD Respiratory status  stable.  Chest xray did not show any acute infiltrate. Continue with scheduled and as needed DuoNeb.  Gout Allopurinol daily    Goals of care   Code Status: Full Code    Mobility impaired mobility at baseline.  Seen by PT.  Home with PT recommended.  Skin assessment     Nutritional status  Body mass index is 30.45 kg/m.  Nutrition Problem: Moderate Malnutrition Etiology: acute  illness Signs/Symptoms: percent weight loss, mild fat depletion, mild muscle depletion Percent weight loss: 2.6 % (in less than 1 week)     Diet  Diet Order             Diet Heart Room service appropriate? Yes; Fluid consistency: Thin  Diet effective now                   DVT prophylaxis  enoxaparin (LOVENOX) injection 40 mg Start: 07/01/22 1000   Antimicrobials IV cefepime Fluid stop IV fluid today [facility] urology Family Communication wife at bedside  Status is inpatient  Continue in-hospital care because pending urine culture report.  Wife is concerned about low-grade fever today.  We will monitor for next 24 hours. Level of care Progressive   Dispo The patient is from home              Anticipated d/c is to pending clinical course.  Likely home tomorrow              Patient currently is not medically stable to d/c.   Difficult to place patient No     Infusions   ceFEPime (MAXIPIME) IV 2 g (06/30/22 1157)    Scheduled Meds  allopurinol  100 mg Oral Daily   amitriptyline  75 mg Oral QHS   amLODipine  10 mg Oral Daily   carbidopa-levodopa  2 tablet Oral 3 times per day   Chlorhexidine Gluconate Cloth  6 each Topical Daily   [START ON 07/01/2022] enoxaparin (LOVENOX) injection  40 mg Subcutaneous Q24H   fluticasone furoate-vilanterol  1 puff Inhalation Daily   melatonin  6 mg Oral QHS   potassium chloride  40 mEq Oral Once   QUEtiapine  37.5 mg Oral QHS   QUEtiapine  50 mg Oral Q24H   topiramate  100 mg Oral QHS   umeclidinium bromide  1 puff Inhalation Daily    PRN meds acetaminophen **OR** acetaminophen, albuterol, ondansetron **OR** ondansetron (ZOFRAN) IV   Antimicrobials Anti-infectives (From admission, onward)    Start     Dose/Rate Route Frequency Ordered Stop   06/30/22 1200  ceFEPIme (MAXIPIME) 2 g in sodium chloride 0.9 % 100 mL IVPB        2 g 200 mL/hr over 30 Minutes Intravenous Every 12 hours 06/30/22 1116     06/28/22 2000   ceFEPIme (MAXIPIME) 2 g in sodium chloride 0.9 % 100 mL IVPB  Status:  Discontinued        2 g 200 mL/hr over 30 Minutes Intravenous Every 24 hours 06/27/22 2221 06/30/22 1116   06/27/22 1915  ceFEPIme (MAXIPIME) 2 g in sodium chloride 0.9 % 100 mL IVPB        2 g 200 mL/hr over 30 Minutes Intravenous  Once 06/27/22 1910 06/27/22 2101       Objective Vitals:   06/30/22 0728 06/30/22 1157  BP:  138/86  Pulse:  85  Resp:  18  Temp:  97.7 F (36.5 C)  SpO2: 95% 96%    Intake/Output Summary (Last  24 hours) at 06/30/2022 1356 Last data filed at 06/30/2022 0900 Gross per 24 hour  Intake --  Output 2975 ml  Net -2975 ml   Filed Weights   06/28/22 1640  Weight: 83 kg   Weight change:  Body mass index is 30.45 kg/m.   Physical Exam General exam pleasant, elderly Caucasian male.  Not in physical distress Skin No rashes, lesions or ulcers. HEENT Atraumatic, normocephalic, no obvious bleeding Lungs clear to auscultation bilaterally CVS regular rate and rhythm, no murmur GI/Abd soft, nontender, nondistended, bowel sound present CNS alert, awake, Parkinson's disease at baseline. Psychiatry sad effect Extremities no pedal edema, no calf tenderness  Data Review I have personally reviewed the laboratory data and studies available.  F/u labs ordered Unresulted Labs (From admission, onward)     Start     Ordered   07/01/22 0500  CBC with Differential/Platelet  Daily at 5am,   R     Question:  Specimen collection method  Answer:  Lab=Lab collect   06/30/22 0843   07/01/22 8937  Basic metabolic panel  Daily at 5am,   R     Question:  Specimen collection method  Answer:  Lab=Lab collect   06/30/22 0843   07/01/22 0500  Magnesium  Tomorrow morning,   R       Question:  Specimen collection method  Answer:  Lab=Lab collect   06/30/22 0849   07/01/22 0500  Phosphorus  Tomorrow morning,   R       Question:  Specimen collection method  Answer:  Lab=Lab collect   06/30/22 0849             Signed, Terrilee Croak, MD [facility] 06/30/2022

## 2022-07-01 LAB — BASIC METABOLIC PANEL
Anion gap: 5 (ref 5–15)
BUN: 34 mg/dL — ABNORMAL HIGH (ref 8–23)
CO2: 21 mmol/L — ABNORMAL LOW (ref 22–32)
Calcium: 8.9 mg/dL (ref 8.9–10.3)
Chloride: 114 mmol/L — ABNORMAL HIGH (ref 98–111)
Creatinine, Ser: 1.73 mg/dL — ABNORMAL HIGH (ref 0.61–1.24)
GFR, Estimated: 41 mL/min — ABNORMAL LOW (ref >60)
Glucose, Bld: 96 mg/dL (ref 70–99)
Potassium: 3.6 mmol/L (ref 3.5–5.1)
Sodium: 140 mmol/L (ref 135–145)

## 2022-07-01 LAB — CBC WITH DIFFERENTIAL/PLATELET
Abs Immature Granulocytes: 0.1 10*3/uL — ABNORMAL HIGH (ref 0.00–0.07)
Basophils Absolute: 0 10*3/uL (ref 0.0–0.1)
Basophils Relative: 0 %
Eosinophils Absolute: 0.4 10*3/uL (ref 0.0–0.5)
Eosinophils Relative: 4 %
HCT: 33.1 % — ABNORMAL LOW (ref 39.0–52.0)
Hemoglobin: 11 g/dL — ABNORMAL LOW (ref 13.0–17.0)
Immature Granulocytes: 1 %
Lymphocytes Relative: 16 %
Lymphs Abs: 1.4 10*3/uL (ref 0.7–4.0)
MCH: 31.7 pg (ref 26.0–34.0)
MCHC: 33.2 g/dL (ref 30.0–36.0)
MCV: 95.4 fL (ref 80.0–100.0)
Monocytes Absolute: 0.9 10*3/uL (ref 0.1–1.0)
Monocytes Relative: 10 %
Neutro Abs: 6.1 10*3/uL (ref 1.7–7.7)
Neutrophils Relative %: 69 %
Platelets: 192 10*3/uL (ref 150–400)
RBC: 3.47 MIL/uL — ABNORMAL LOW (ref 4.22–5.81)
RDW: 13.6 % (ref 11.5–15.5)
WBC: 8.9 10*3/uL (ref 4.0–10.5)
nRBC: 0 % (ref 0.0–0.2)

## 2022-07-01 LAB — PHOSPHORUS: Phosphorus: 3.1 mg/dL (ref 2.5–4.6)

## 2022-07-01 LAB — URINE CULTURE: Culture: 80000 — AB

## 2022-07-01 LAB — MAGNESIUM: Magnesium: 2 mg/dL (ref 1.7–2.4)

## 2022-07-01 MED ORDER — SACCHAROMYCES BOULARDII 250 MG PO CAPS: 250.0000 mg | ORAL_CAPSULE | Freq: Two times a day (BID) | ORAL | 0 refills | Status: AC

## 2022-07-01 MED ORDER — CIPROFLOXACIN HCL 500 MG PO TABS: 500.0000 mg | ORAL_TABLET | Freq: Two times a day (BID) | ORAL | Status: DC

## 2022-07-01 MED ORDER — POTASSIUM CHLORIDE CRYS ER 20 MEQ PO TBCR: 40.0000 meq | EXTENDED_RELEASE_TABLET | Freq: Every day | ORAL | Status: AC | PRN

## 2022-07-01 MED ORDER — CIPROFLOXACIN HCL 500 MG PO TABS: 500.0000 mg | ORAL_TABLET | Freq: Two times a day (BID) | ORAL | 0 refills | Status: AC

## 2022-07-01 MED ORDER — HYDROCHLOROTHIAZIDE 25 MG PO TABS: 12.5000 mg | ORAL_TABLET | Freq: Every day | ORAL | Status: AC | PRN

## 2022-07-01 NOTE — TOC Transition Note (Signed)
Transition of Care [facility]) - CM/SW Discharge Note   Patient Details  Name: Albert Hahn MRN: [id number] Date of Birth: 05/19/1946  Transition of Care [facility]) CM/SW Contact:  Dessa Phi, RN Phone Number: 07/01/2022, 11:29 AM   Clinical Narrative: Per prior note. [facility]-HHPT/OT already set up. No further CM needs.      Final next level of care: [place] Barriers to Discharge: No Barriers Identified   Patient Goals and CMS Choice Patient states their goals for this hospitalization and ongoing recovery are::  (Home) CMS Medicare.gov Compare Post Acute Care list provided to:: Patient Represenative (must comment) Choice offered to / list presented to : Spouse  Discharge Placement                       Discharge Plan and Services   Discharge Planning Services: CM Consult Post Acute Care Choice: Resumption of Svcs/PTA Provider, Durable Medical Equipment (shipmans-private duty;rw,w/c.)                               Social Determinants of Health (SDOH) Interventions     Readmission Risk Interventions    06/29/2022   12:57 PM  Readmission Risk Prevention Plan  Transportation Screening Complete  Medication Review ([place]) Complete  PCP or Specialist appointment within 3-5 days of discharge Complete  HRI or [employer] Complete  SW Recovery Care/Counseling Consult Complete  [place] Not Applicable

## 2022-07-01 NOTE — Discharge Summary (Addendum)
Physician Discharge Summary  Karon Heckendorn Reger ZES:[id number] DOB: Oct 08, 1945 DOA: 06/27/2022  PCP: Asencion Noble, MD  Admit date: 06/27/2022 Discharge date: 07/01/2022  Admitted From: Home Discharge disposition: Home with home health  Recommendations at discharge:  Complete the course of antibiotics with 7 more days of ciprofloxacin with probiotics. Advised to to monitor blood pressure at home.  Continue amlodipine daily.  However take HCTZ only as needed if systolic blood pressure is more than 150.  Potassium supplement to be given as well with HCTZ.   Brief narrative Baley Lorimer Higginson is a 76 y.o. male with PMH significant for HTN, CKD 4, COPD, Parkinson's disease, chronic left-sided hydronephrosis s/p ureteral stent exchange by Dr. Abner Greenspan on 11/10. 11/13, patient presented to the ED with complaint of low-grade fever, severe fatigue.  Few days prior, he had called urology office and was started on Augmentin for presumed UTI.  He failed to improve with antibiotics and hence came to the ED.    In the ED, patient was afebrile, mildly tachycardic, labs showed WBC count 18.4, lactate 1.8, creatinine elevated to 2.62 from baseline of 2.24 Urinalysis showed large leukocytes, negative nitrites, more than 50 WBCs CT scan showed appropriately positioned left ureteral stent with residual moderate to severe left hydronephrosis decreased from prior. Foley catheter was placed in the ED. Urine culture and blood culture was sent Broad-spectrum antibiotics started. Admitted to [facility] for sepsis secondary to UTI Urology consulted. See below for details  Subjective Patient was seen and examined this morning.  Lying on bed.  Not in distress.  No new symptoms.  No fever in last 24 hours.  Hemodynamically stable.  Labs improving.  Wife and daughter who were at bedside.  They had multiple concerns about patient's status is stable to go home.  Chart reviewed.  All questions answered.  At this time, patient is stable  for discharge.  Longer the stay in the hospital, he is at risk of losing physical strength as well as mental capacity.  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 Met sepsis criteria with tachycardia, leukocytosis, AKI Urine culture grew 80,000 CFU per mL of Pseudomonas. Blood culture did not show any growth. Currently improving on IV cefepime.  Based on susceptibility pattern, will discharge the patient home with oral ciprofloxacin for next 7 days with probiotics.  Discussed with pharmacy. Urology consult appreciated.  Passed voiding trial yesterday. Recent Labs  Lab 06/27/22 1745 06/27/22 1915 06/27/22 2110 06/28/22 0600 06/30/22 1029 07/01/22 0507  WBC 18.4*  --   --  15.5* 7.8 8.9  LATICACIDVEN  --  1.8 1.9  --   --   --    AKI on CKD 4 Baseline creatinine 2.24 on 11/1.  Presented with creatinine elevated 2.62.  Improved with IV fluid.  Adequately hydrated.  Creatinine much better than baseline. Recent Labs    12/30/21 1615 12/31/21 0730 01/01/22 0956 06/15/22 1325 06/27/22 1745 06/28/22 0600 06/30/22 1029 07/01/22 0507  BUN 47* 43* 46* 45* 55* 51* 39* 34*  CREATININE 2.49* 2.43* 2.23* 2.24* 2.62* 2.18* 1.94* 1.73*   Essential hypertension PTA on HCTZ 12.5 mg daily and amlodipine 10 mg daily.  Currently HCTZ on hold and amlodipine is continued.  Blood pressure is acceptable and less than 263 systolic.  Patient's wife states that he gets episodes of dizziness at  home when he gets up.  I wonder if he is having orthostatic hypotension pronounced because of dysautonomia related to Parkinson disease.  I advised patient's wife to monitor blood pressure at home and take HCTZ only as needed if systolic blood pressure is more than 150.  Potassium supplement to be given as well with  HCTZ.   Hypokalemia Potassium level improved with replacement. Recent Labs  Lab 06/27/22 1745 06/27/22 1916 06/28/22 0600 06/30/22 1029 07/01/22 0507  K 2.8*  --  2.9* 3.4* 3.6  MG  --  2.0  --   --  2.0  PHOS  --   --   --   --  3.1   Acute metabolic Encephalopathy Altered mental status as presentation likely due to sepsis. Mental status back to baseline now.    Parkinson's disease with dementia Continue home Sinemet, Pimavanserin  Anxiety/depression Migraine PTA on Elavil 25 mg at bedtime, melatonin 6 mg at bedtime, Seroquel 50 mg am/37.5 mg pm, Topamax 100 mg daily Continue all.  COPD Respiratory status stable.  Chest xray did not show any acute infiltrate. Continue bronchodilators.  Gout Allopurinol daily  Wounds:  - Incision (Closed) 06/24/22 Penis Other (Comment) (Active)  Date First Assessed/Time First Assessed: 06/24/22 0837   Location: Penis  Location Orientation: Other (Comment)    Assessments 06/24/2022  9:43 AM 06/29/2022 10:15 PM  Dressing Type None Other (Comment)     No associated orders.    Discharge Exam:   Vitals:   06/30/22 1734 06/30/22 2025 07/01/22 0458 07/01/22 0936  BP:  (!) 145/85 (!) 153/70 (!) 143/73  Pulse: 84 75 68 62  Resp:  _0 Temp:  98.2 F (36.8 C) 98 F (36.7 C) 98.3 F (36.8 C)  TempSrc:  Oral Oral Oral  SpO2: 96% 96% 98% 95%  Weight:      Height:        Body mass index is 30.45 kg/m.  General exam pleasant, elderly Caucasian male.  Not in physical distress Skin No rashes, lesions or ulcers. HEENT Atraumatic, normocephalic, no obvious bleeding Lungs Clear to auscultation bilaterally CVS regular rate and rhythm, no murmur GI/Abd soft, nontender, nondistended, bowel sound present CNS alert, awake, Parkinson's disease at baseline. Psychiatry mood appropriate Extremities no pedal edema, no calf tenderness  Follow ups:    Follow-up [place]. Follow up.   Why: [place]  physical therapy/occupational therapy Contact information: [place] [address] [phone]         Asencion Noble, MD Follow up.   Specialty: Internal Medicine Contact information: [address] [place] [address] [phone]                 Discharge Instructions:   Discharge Instructions     Call MD for:  difficulty breathing, headache or visual disturbances   Complete by: As directed    Call MD for:  extreme fatigue   Complete by: As directed    Call MD for:  hives   Complete by: As directed    Call MD for:  persistant dizziness or light-headedness   Complete by: As directed    Call MD for:  persistant nausea and vomiting   Complete by: As directed    Call MD for:  severe uncontrolled pain   Complete by: As directed    Call MD for:  temperature >100.4   Complete by: As directed  Diet general   Complete by: As directed    Discharge instructions   Complete by: As directed    Recommendations at discharge:   Complete the course of antibiotics with 7 more days of ciprofloxacin with probiotics.  Advised to to monitor blood pressure at home.  Continue amlodipine daily.  However take HCTZ only as needed if systolic blood pressure is more than 150.  Potassium supplement to be given as well with HCTZ.  General discharge instructions: Follow with Primary MD Asencion Noble, MD in 7 days  Please request your PCP  to go over your hospital tests, procedures, radiology results at the follow up. Please get your medicines reviewed and adjusted.  Your PCP may decide to repeat certain labs or tests as needed. Do not drive, operate heavy machinery, perform activities at heights, swimming or participation in water activities or provide baby sitting services if your were admitted for syncope or siezures until you have seen by Primary MD or a Neurologist and advised to do so again. [place] Controlled Substance Reporting System database was  reviewed. Do not drive, operate heavy machinery, perform activities at heights, swim, participate in water activities or provide baby-sitting services while on medications for pain, sleep and mood until your outpatient physician has reevaluated you and advised to do so again.  You are strongly recommended to comply with the dose, frequency and duration of prescribed medications. Activity: As tolerated with Full fall precautions use walker/cane & assistance as needed Avoid using any recreational substances like cigarette, tobacco, alcohol, or non-prescribed drug. If you experience worsening of your admission symptoms, develop shortness of breath, life threatening emergency, suicidal or homicidal thoughts you must seek medical attention immediately by calling 911 or calling your MD immediately  if symptoms less severe. You must read complete instructions/literature along with all the possible adverse reactions/side effects for all the medicines you take and that have been prescribed to you. Take any new medicine only after you have completely understood and accepted all the possible adverse reactions/side effects.  Wear Seat belts while driving. You were cared for by a hospitalist during your hospital stay. If you have any questions about your discharge medications or the care you received while you were in the hospital after you are discharged, you can call the unit and ask to speak with the hospitalist or the covering physician. Once you are discharged, your primary care physician will handle any further medical issues. Please note that NO REFILLS for any discharge medications will be authorized once you are discharged, as it is imperative that you return to your primary care physician (or establish a relationship with a primary care physician if you do not have one).   Discharge wound care:   Complete by: As directed    Increase activity slowly   Complete by: As directed        Discharge Medications:    Allergies as of 07/01/2022       Reactions   Bee Venom Anaphylaxis   Shellfish Allergy Anaphylaxis   Wasp Venom Anaphylaxis   Lisinopril Cough   Other Nausea And Vomiting, Other (See Comments)   Unknown anesthetic spray - nausea/vomiting   Codeine Nausea And Vomiting        Medication List     TAKE these medications    acetaminophen 500 MG tablet Commonly known as: TYLENOL Take 500-1,000 mg by mouth every 6 (six) hours as needed (for migraines or mild pain).   allopurinol 100 MG tablet Commonly  known as: ZYLOPRIM Take 100 mg by mouth daily.   amitriptyline 25 MG tablet Commonly known as: ELAVIL Take 25 mg by mouth at bedtime. What changed: Another medication with the same name was removed. Continue taking this medication, and follow the directions you see here.   amLODipine 10 MG tablet Commonly known as: NORVASC Take 10 mg by mouth daily.   carbidopa-levodopa 25-100 MG tablet Commonly known as: SINEMET IR Take 2 tablets by mouth See admin instructions. Take 2 tablets by mouth at 9:30 AM, 4 PM, and 9:30 PM   ciprofloxacin 500 MG tablet Commonly known as: CIPRO Take 1 tablet (500 mg total) by mouth 2 (two) times daily for 7 days.   docusate sodium 100 MG capsule Commonly known as: COLACE Take 100 mg by mouth daily as needed for mild constipation.   hydrochlorothiazide 25 MG tablet Commonly known as: HYDRODIURIL Take 0.5 tablets (12.5 mg total) by mouth daily as needed (SBP>150). What changed:  when to take this reasons to take this   Melatonin 3 MG Caps Take 6 mg by mouth at bedtime.   Pimavanserin Tartrate 34 MG Caps Take 34 mg by mouth daily.   potassium chloride SA 20 MEQ tablet Commonly known as: KLOR-CON M Take 2 tablets (40 mEq total) by mouth daily as needed. On the days you take HCTZ What changed:  when to take this reasons to take this additional instructions   QUEtiapine 25 MG tablet Commonly known as: SEROQUEL Take 37.5-50 mg by  mouth See admin instructions. Take 50 mg by mouth at 3 PM and 37.5 mg at bedtime   saccharomyces boulardii 250 MG capsule Commonly known as: FLORASTOR Take 1 capsule (250 mg total) by mouth 2 (two) times daily for 10 days.   topiramate 100 MG tablet Commonly known as: TOPAMAX Take 100 mg by mouth at bedtime.   Vitamin D3 50 MCG (2000 UT) Tabs Take 4,000 Units by mouth daily.               Discharge Care Instructions  (From admission, onward)           Start     Ordered   07/01/22 0000  Discharge wound care:        07/01/22 1300             The results of significant diagnostics from this hospitalization (including imaging, microbiology, ancillary and laboratory) are listed below for reference.    Procedures and Diagnostic Studies:   DG Chest Port 1 View  Result Date: 06/27/2022 CLINICAL DATA:  Confusion.  Surgery 3 days ago. EXAM: PORTABLE CHEST 1 VIEW COMPARISON:  AP chest 10/28/2019, CT chest 10/22/2019 FINDINGS: Cardiac silhouette is again mildly to moderately enlarged. Mediastinal contours are unchanged with mildly tortuous thoracic aorta as seen on prior CT. Note is made the ascending aorta measures up to 4.1 cm in caliber on prior CT. Mildly decreased lung volumes. The lungs appear clear. No pleural effusion pneumothorax. Moderate multilevel degenerative disc changes of the thoracic spine. IMPRESSION: 1. No acute cardiopulmonary process. 2. Mild to moderate cardiomegaly. 3. Tortuous thoracic aorta as seen on prior CT. Electronically Signed   By: Yvonne Kendall M.D.   On: 06/27/2022 20:31   CT ABDOMEN PELVIS WO CONTRAST  Result Date: 06/27/2022 CLINICAL DATA:  Sepsis flank pain EXAM: CT ABDOMEN AND PELVIS WITHOUT CONTRAST TECHNIQUE: Multidetector CT imaging of the abdomen and pelvis was performed following the standard protocol without IV contrast. RADIATION DOSE REDUCTION: This exam  was performed according to the departmental dose-optimization program which  includes automated exposure control, adjustment of the mA and/or kV according to patient size and/or use of iterative reconstruction technique. COMPARISON:  CT 12/30/2021 FINDINGS: Lower chest: Lung bases demonstrate no acute airspace disease. Coronary vascular calcification. Hepatobiliary: Calcified granuloma. No calcified gallstone or biliary dilatation. Pancreas: Unremarkable. No pancreatic ductal dilatation or surrounding inflammatory changes. Spleen: Normal in size without focal abnormality. Adrenals/Urinary Tract: Adrenal glands are normal. Mild atrophy left kidney. Prominent right renal pelvis without hydroureter or calyceal dilatation. Interim placement of left-sided ureteral stent with proximal pigtail in the left renal pelvis and distal pigtail in the right anterior bladder. Residual moderate severe hydronephrosis but decreased compared to prior CT. The bladder is otherwise unremarkable Stomach/Bowel: The stomach is nonenlarged. No dilated small bowel. No acute bowel wall thickening. Negative appendix. Vascular/Lymphatic: Advanced aortic atherosclerosis. 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 No suspicious lymph nodes. Reproductive: Enlarged prostate Other: Negative for pelvic effusion or free air. Shunt catheter tubing with tip in the right lower quadrant. Small volume fluid in the right lower quadrant and pelvis likely related to the shunt. Musculoskeletal: No acute osseous abnormality. Stable augmentation changes at L3 IMPRESSION: 1. Interim placement of left-sided ureteral stent. Residual moderate to severe left-sided hydronephrosis but decreased compared to prior CT. Mild left renal atrophy. 2. Shunt catheter tubing with tip in the right lower quadrant. Small volume fluid in the right lower quadrant and pelvis likely related to shunt catheter. 3. Aortic atherosclerosis. Aortic Atherosclerosis (ICD10-I70.0). Electronically Signed    By: Donavan Foil M.D.   On: 06/27/2022 20:15   CT Head Wo Contrast  Result Date: 06/27/2022 CLINICAL DATA:  Altered level of consciousness, lethargy, fever, recent urologic procedure EXAM: CT HEAD WITHOUT CONTRAST TECHNIQUE: Contiguous axial images were obtained from the base of the skull through the vertex without intravenous contrast. RADIATION DOSE REDUCTION: This exam was performed according to the departmental dose-optimization program which includes automated exposure control, adjustment of the mA and/or kV according to patient size and/or use of iterative reconstruction technique. COMPARISON:  10/10/2019 FINDINGS: Brain: Stable position of the right frontal ventriculostomy catheter. Prior abandoned catheter from right parietal approach also unchanged. Confluent hypodensities throughout the periventricular white matter consistent with chronic small vessel ischemic changes, stable. No evidence of acute infarct or hemorrhage. Stable appearance of the lateral ventricles and midline structures. No acute extra-axial fluid collections. No mass effect. Vascular: No hyperdense vessel or unexpected calcification. Skull: Normal. Negative for fracture or focal lesion. Sinuses/Orbits: No acute finding. Other: None. IMPRESSION: 1. Stable head CT, no acute intracranial process. Electronically Signed   By: Randa Ngo M.D.   On: 06/27/2022 19:17     Labs:   Basic Metabolic Panel: Recent Labs  Lab 06/27/22 1745 06/27/22 1916 06/28/22 0600 06/30/22 1029 07/01/22 0507  NA 138  --  134* 138 140  K 2.8*  --  2.9* 3.4* 3.6  CL 103  --  105 113* 114*  CO2 21*  --  21* 18* 21*  GLUCOSE 113*  --  93 118* 96  BUN 55*  --  51* 39* 34*  CREATININE 2.62*  --  2.18* 1.94* 1.73*  CALCIUM 9.1  --  8.4* 8.7* 8.9  MG  --  2.0  --   --  2.0  PHOS  --   --   --   --  3.1  GFR Estimated Creatinine Clearance: 36.6 mL/min (A) (by C-G formula based on SCr of 1.73 mg/dL (H)). Liver Function Tests: Recent Labs   Lab 06/27/22 1745  AST 17  ALT <5  ALKPHOS 91  BILITOT 0.8  PROT 7.5  ALBUMIN 3.8   No results for input(s): "LIPASE", "AMYLASE" in the last 168 hours. No results for input(s): "AMMONIA" in the last 168 hours. Coagulation profile No results for input(s): "INR", "PROTIME" in the last 168 hours.  CBC: Recent Labs  Lab 06/27/22 1745 06/28/22 0600 06/30/22 1029 07/01/22 0507  WBC 18.4* 15.5* 7.8 8.9  NEUTROABS 15.4*  --   --  6.1  HGB 14.1 10.8* 11.3* 11.0*  HCT 42.0 31.5* 33.8* 33.1*  MCV 94.0 93.5 94.7 95.4  PLT 199 153 184 192   Cardiac Enzymes: No results for input(s): "CKTOTAL", "CKMB", "CKMBINDEX", "TROPONINI" in the last 168 hours. BNP: Invalid input(s): "POCBNP" CBG: Recent Labs  Lab 06/27/22 1738  GLUCAP 112*   D-Dimer No results for input(s): "DDIMER" in the last 72 hours. Hgb A1c No results for input(s): "HGBA1C" in the last 72 hours. Lipid Profile No results for input(s): "CHOL", "HDL", "LDLCALC", "TRIG", "CHOLHDL", "LDLDIRECT" in the last 72 hours. Thyroid function studies No results for input(s): "TSH", "T4TOTAL", "T3FREE", "THYROIDAB" in the last 72 hours.  Invalid input(s): "FREET3" Anemia work up No results for input(s): "VITAMINB12", "FOLATE", "FERRITIN", "TIBC", "IRON", "RETICCTPCT" in the last 72 hours. Microbiology Recent Results (from the past 240 hour(s))  Blood culture (routine x 2)     Status: None (Preliminary result)   Collection Time: 06/27/22  7:15 PM   Specimen: BLOOD  Result Value Ref Range Status   Specimen Description   Final    BLOOD RIGHT ANTECUBITAL Performed at [facility] [address], [place], [place] [address]    Special Requests   Final    BOTTLES DRAWN AEROBIC AND ANAEROBIC Blood Culture adequate volume Performed at [facility] [address] [place], [place] [address]    Culture   Final    NO GROWTH 4 DAYS Performed at [facility], [place] [address],  [place], [place] [address]    Report Status PENDING  Incomplete  Urine Culture     Status: Abnormal (Preliminary result)   Collection Time: 06/27/22  8:51 PM   Specimen: Urine, Clean Catch  Result Value Ref Range Status   Specimen Description   Final    URINE, CLEAN CATCH Performed at [facility], [place] [address], [place], [place] [address]    Special Requests   Final    NONE Performed at [facility], [place] [address], [place], [place] [address]    Culture (A)  Final    80,000 COLONIES/mL PSEUDOMONAS AERUGINOSA Two isolates with different morphologies were identified as the same organism.The most resistant organism was reported. Performed at [facility], [place] [address], [place], [place] [address]    Report Status PENDING  Incomplete   Organism ID, Bacteria PSEUDOMONAS AERUGINOSA (A)  Final      Susceptibility   Pseudomonas aeruginosa - MIC*    CEFTAZIDIME <=1 SENSITIVE Sensitive     CIPROFLOXACIN <=0.25 SENSITIVE Sensitive     GENTAMICIN <=1 SENSITIVE Sensitive     IMIPENEM 2 SENSITIVE Sensitive     PIP/TAZO <=4 SENSITIVE Sensitive     CEFEPIME Value in next row Sensitive      SENSITIVE2    * 80,000 COLONIES/mL PSEUDOMONAS AERUGINOSA  Resp Panel by RT-PCR (Flu A&B, Covid) Anterior Nasal Swab     Status: None   Collection Time: 06/27/22  8:51 PM   Specimen: Anterior Nasal Swab  Result Value Ref Range Status   SARS Coronavirus 2 by RT PCR NEGATIVE NEGATIVE Final    Comment: (NOTE) SARS-CoV-2 target nucleic acids are NOT DETECTED.  The SARS-CoV-2 RNA is generally detectable in upper respiratory specimens during the acute phase of infection. The lowest concentration of SARS-CoV-2 viral copies this assay can detect is 138 copies/mL. A negative result does not preclude SARS-Cov-2 infection and should not be used as the sole basis for treatment or other patient management decisions. A negative result may occur with  improper specimen  collection/handling, submission of specimen other than nasopharyngeal swab, presence of viral mutation(s) within the areas targeted by this assay, and inadequate number of viral copies(<138 copies/mL). A negative result must be combined with clinical observations, patient history, and epidemiological information. The expected result is Negative.  Fact Sheet for Patients:  EntrepreneurPulse.com.au  Fact Sheet for Healthcare Providers:  IncredibleEmployment.be  This test is no t yet approved or cleared by the [place] FDA and  has been authorized for detection and/or diagnosis of SARS-CoV-2 by FDA under an Emergency Use Authorization (EUA). This EUA will remain  in effect (meaning this test can be used) for the duration of the COVID-19 declaration under Section 564(b)(1) of the Act, 21 U.S.C.section 360bbb-3(b)(1), unless the authorization is terminated  or revoked sooner.       Influenza A by PCR NEGATIVE NEGATIVE Final   Influenza B by PCR NEGATIVE NEGATIVE Final    Comment: (NOTE) The Xpert Xpress SARS-CoV-2/FLU/RSV plus assay is intended as an aid in the diagnosis of influenza from Nasopharyngeal swab specimens and should not be used as a sole basis for treatment. Nasal washings and aspirates are unacceptable for Xpert Xpress SARS-CoV-2/FLU/RSV testing.  Fact Sheet for Patients: EntrepreneurPulse.com.au  Fact Sheet for Healthcare Providers: IncredibleEmployment.be  This test is not yet approved or cleared by the [place] FDA and has been authorized for detection and/or diagnosis of SARS-CoV-2 by FDA under an Emergency Use Authorization (EUA). This EUA will remain in effect (meaning this test can be used) for the duration of the COVID-19 declaration under Section 564(b)(1) of the Act, 21 U.S.C. section 360bbb-3(b)(1), unless the authorization is terminated or revoked.  Performed at [facility], [place] [address] [place], [place] [address]   Blood culture (routine x 2)     Status: None (Preliminary result)   Collection Time: 06/27/22  9:10 PM   Specimen: BLOOD  Result Value Ref Range Status   Specimen Description   Final    BLOOD LEFT ANTECUBITAL Performed at [facility] [address] [place], [place] [address]    Special Requests   Final    BOTTLES DRAWN AEROBIC AND ANAEROBIC Blood Culture adequate volume Performed at [facility] [address] [place], [place] [address]    Culture   Final    NO GROWTH 4 DAYS Performed at [facility], [place] [address] [place], [place] [address]    Report Status PENDING  Incomplete    Time coordinating discharge: 35 minutes  Signed:    [facility] 07/01/2022, 1:05 PM

## 2022-07-01 NOTE — TOC Progression Note (Signed)
Transition of Care [facility]) - Progression Note    Patient Details  Name: WELLINGTON WINEGARDEN MRN: [id number] Date of Birth: 04-27-1946  Transition of Care [facility]) CM/SW Contact  Dekayla Prestridge, Juliann Pulse, RN Phone Number: 07/01/2022, 1:09 PM  Clinical Narrative:added nursing for [facility].       Expected Discharge Plan: [place] Barriers to Discharge: No Barriers Identified  Expected Discharge Plan and Services Expected Discharge Plan: [place]   Discharge Planning Services: CM Consult Post Acute Care Choice: Resumption of Svcs/PTA Provider, Durable Medical Equipment (shipmans-private duty;rw,w/c.) Living arrangements for the past 2 months: Single Family Home Expected Discharge Date: 07/01/22                                     Social Determinants of Health (SDOH) Interventions    Readmission Risk Interventions    06/29/2022   12:57 PM  Readmission Risk Prevention Plan  Transportation Screening Complete  Medication Review ([place]) Complete  PCP or Specialist appointment within 3-5 days of discharge Complete  HRI or [place] Complete  SW Recovery Care/Counseling Consult Complete  [place] Not Applicable

## 2022-07-01 NOTE — Progress Notes (Signed)
Patient discharging to home this afternoon. Patient's Wife given discharge instructions including all discharge Medications and schedules for these Medications. Understanding verbalized and discharge AVS with the Patient at time of discharge.

## 2022-07-01 NOTE — Progress Notes (Signed)
  Subjective: Feels well. Denies fevers. Voiding.  Objective: Vital signs in last 24 hours: Temp:  [98 F (36.7 C)-98.5 F (36.9 C)] 98.3 F (36.8 C) (11/17 0936) Pulse Rate:  [62-84] 62 (11/17 0936) Resp:  [16-20] 16 (11/17 0936) BP: (143-153)/(70-85) 143/73 (11/17 0936) SpO2:  [95 %-98 %] 95 % (11/17 0936)  Intake/Output from previous day: 11/16 0701 - 11/17 0700 In: 111.9 [IV Piggyback:111.9] Out: 1625 [Urine:1625] Intake/Output this shift: Total I/O In: 240 [P.O.:240] Out: 900 [Urine:900]  Physical Exam:  General: Alert and oriented CV: RRR Lungs: Clear Abdomen: Soft, ND NT Ext: NT, No erythema  Lab Results: Recent Labs    06/30/22 1029 07/01/22 0507  HGB 11.3* 11.0*  HCT 33.8* 33.1*   BMET Recent Labs    06/30/22 1029 07/01/22 0507  NA 138 140  K 3.4* 3.6  CL 113* 114*  CO2 18* 21*  GLUCOSE 118* 96  BUN 39* 34*  CREATININE 1.94* 1.73*  CALCIUM 8.7* 8.9     Studies/Results: No results found.  Assessment/Plan: 1.         Sepsis secondary to UTI 2.         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 Bcx no growth. -Ok to discharge home with ciprofloxacin for 1 week   Matt R. Tyquarius Paglia MD 07/01/2022, 2:11 PM [facility]  Pager: [phone]

## 2022-07-02 LAB — CULTURE, BLOOD (ROUTINE X 2)
Culture: NO GROWTH
Culture: NO GROWTH
Special Requests: ADEQUATE
Special Requests: ADEQUATE

## 2022-07-05 DIAGNOSIS — B965 Pseudomonas (aeruginosa) (mallei) (pseudomallei) as the cause of diseases classified elsewhere: Secondary | ICD-10-CM | POA: Diagnosis not present

## 2022-07-05 DIAGNOSIS — E875 Hyperkalemia: Secondary | ICD-10-CM | POA: Diagnosis not present

## 2022-07-05 DIAGNOSIS — N390 Urinary tract infection, site not specified: Secondary | ICD-10-CM | POA: Diagnosis not present

## 2022-07-12 DIAGNOSIS — N1832 Chronic kidney disease, stage 3b: Secondary | ICD-10-CM | POA: Diagnosis not present

## 2022-07-12 DIAGNOSIS — I10 Essential (primary) hypertension: Secondary | ICD-10-CM | POA: Diagnosis not present

## 2022-07-12 DIAGNOSIS — Z79899 Other long term (current) drug therapy: Secondary | ICD-10-CM | POA: Diagnosis not present

## 2022-07-13 DIAGNOSIS — N1832 Chronic kidney disease, stage 3b: Secondary | ICD-10-CM | POA: Diagnosis not present

## 2022-07-13 DIAGNOSIS — E876 Hypokalemia: Secondary | ICD-10-CM | POA: Diagnosis not present

## 2022-09-15 DIAGNOSIS — F02C2 Dementia in other diseases classified elsewhere, severe, with psychotic disturbance: Secondary | ICD-10-CM | POA: Diagnosis not present

## 2022-09-15 DIAGNOSIS — Z515 Encounter for palliative care: Secondary | ICD-10-CM | POA: Diagnosis not present

## 2022-09-15 DIAGNOSIS — G20C Parkinsonism, unspecified: Secondary | ICD-10-CM | POA: Diagnosis not present

## 2022-09-15 DIAGNOSIS — F482 Pseudobulbar affect: Secondary | ICD-10-CM | POA: Diagnosis not present

## 2022-09-26 DIAGNOSIS — N3000 Acute cystitis without hematuria: Secondary | ICD-10-CM | POA: Diagnosis not present

## 2022-10-10 DIAGNOSIS — E1129 Type 2 diabetes mellitus with other diabetic kidney complication: Secondary | ICD-10-CM | POA: Diagnosis not present

## 2022-10-14 DIAGNOSIS — G20C Parkinsonism, unspecified: Secondary | ICD-10-CM | POA: Diagnosis not present

## 2022-10-14 DIAGNOSIS — Z515 Encounter for palliative care: Secondary | ICD-10-CM | POA: Diagnosis not present

## 2022-10-14 DIAGNOSIS — F482 Pseudobulbar affect: Secondary | ICD-10-CM | POA: Diagnosis not present

## 2022-10-14 DIAGNOSIS — F02C2 Dementia in other diseases classified elsewhere, severe, with psychotic disturbance: Secondary | ICD-10-CM | POA: Diagnosis not present

## 2022-10-19 DIAGNOSIS — I10 Essential (primary) hypertension: Secondary | ICD-10-CM | POA: Diagnosis not present

## 2022-10-31 DIAGNOSIS — G20C Parkinsonism, unspecified: Secondary | ICD-10-CM | POA: Diagnosis not present

## 2022-10-31 DIAGNOSIS — N184 Chronic kidney disease, stage 4 (severe): Secondary | ICD-10-CM | POA: Diagnosis not present

## 2022-10-31 DIAGNOSIS — I10 Essential (primary) hypertension: Secondary | ICD-10-CM | POA: Diagnosis not present

## 2023-02-02 ENCOUNTER — Telehealth (HOSPITAL_COMMUNITY): Payer: Self-pay | Admitting: *Deleted

## 2023-02-02 NOTE — Telephone Encounter (Signed)
Attempted to contact patient to schedule OP MBS. Left VM. RKEEL 

## 2023-02-13 ENCOUNTER — Telehealth (HOSPITAL_COMMUNITY): Payer: Self-pay | Admitting: *Deleted

## 2023-02-13 NOTE — Telephone Encounter (Signed)
Talked with patients wife. She requested I follow up next about scheduling OP MBS after patients RN makes home visit. RKEEL

## 2023-02-21 ENCOUNTER — Telehealth (HOSPITAL_COMMUNITY): Payer: Self-pay | Admitting: *Deleted

## 2023-02-21 NOTE — Telephone Encounter (Signed)
Attempted to contact patient to schedule OP MBS. Left VM. RKEEL 

## 2023-03-16 DEATH — deceased
